# Patient Record
Sex: Female | Born: 1940 | Race: White | Hispanic: No | Smoking: Never smoker
Health system: Southern US, Community
[De-identification: ages and names within clinical notes are randomized; demographics above are authoritative.]

## PROBLEM LIST (undated history)

## (undated) DIAGNOSIS — R079 Chest pain, unspecified: Secondary | ICD-10-CM

## (undated) HISTORY — DX: Chest pain, unspecified: R07.9

## (undated) HISTORY — PX: CHOLECYSTECTOMY: SHX55

## (undated) HISTORY — PX: APPENDECTOMY: SHX54

---

## 2001-04-15 ENCOUNTER — Inpatient Hospital Stay (HOSPITAL_COMMUNITY): Admission: AD | Admit: 2001-04-15 | Discharge: 2001-04-19 | Payer: Self-pay | Admitting: Cardiovascular Disease

## 2014-01-16 DIAGNOSIS — E039 Hypothyroidism, unspecified: Secondary | ICD-10-CM | POA: Insufficient documentation

## 2014-01-16 DIAGNOSIS — I482 Chronic atrial fibrillation, unspecified: Secondary | ICD-10-CM

## 2014-01-16 HISTORY — DX: Chronic atrial fibrillation, unspecified: I48.20

## 2014-01-16 HISTORY — DX: Hypothyroidism, unspecified: E03.9

## 2014-01-23 DIAGNOSIS — Z9889 Other specified postprocedural states: Secondary | ICD-10-CM

## 2014-01-23 DIAGNOSIS — Z9049 Acquired absence of other specified parts of digestive tract: Secondary | ICD-10-CM | POA: Insufficient documentation

## 2014-01-23 HISTORY — DX: Acquired absence of other specified parts of digestive tract: Z98.890

## 2014-01-23 HISTORY — DX: Acquired absence of other specified parts of digestive tract: Z90.49

## 2016-11-17 DIAGNOSIS — R1319 Other dysphagia: Secondary | ICD-10-CM

## 2016-11-17 HISTORY — DX: Other dysphagia: R13.19

## 2018-11-25 DIAGNOSIS — G8929 Other chronic pain: Secondary | ICD-10-CM

## 2018-11-25 HISTORY — DX: Other chronic pain: G89.29

## 2018-12-01 HISTORY — DX: Hypomagnesemia: E83.42

## 2018-12-03 DIAGNOSIS — K5900 Constipation, unspecified: Secondary | ICD-10-CM

## 2018-12-03 HISTORY — DX: Constipation, unspecified: K59.00

## 2021-04-16 DIAGNOSIS — G4733 Obstructive sleep apnea (adult) (pediatric): Secondary | ICD-10-CM | POA: Insufficient documentation

## 2021-04-16 DIAGNOSIS — F32A Depression, unspecified: Secondary | ICD-10-CM

## 2021-04-16 DIAGNOSIS — K219 Gastro-esophageal reflux disease without esophagitis: Secondary | ICD-10-CM | POA: Insufficient documentation

## 2021-04-16 DIAGNOSIS — I10 Essential (primary) hypertension: Secondary | ICD-10-CM

## 2021-04-16 HISTORY — DX: Gastro-esophageal reflux disease without esophagitis: K21.9

## 2021-04-16 HISTORY — DX: Essential (primary) hypertension: I10

## 2021-04-16 HISTORY — DX: Obstructive sleep apnea (adult) (pediatric): G47.33

## 2021-04-16 HISTORY — DX: Depression, unspecified: F32.A

## 2021-07-03 ENCOUNTER — Ambulatory Visit: Payer: Medicare HMO | Admitting: Podiatry

## 2021-07-10 ENCOUNTER — Other Ambulatory Visit: Payer: Self-pay

## 2021-07-10 ENCOUNTER — Ambulatory Visit: Payer: Medicare HMO | Admitting: Podiatry

## 2021-07-10 DIAGNOSIS — B351 Tinea unguium: Secondary | ICD-10-CM

## 2021-07-10 DIAGNOSIS — M79609 Pain in unspecified limb: Secondary | ICD-10-CM

## 2021-07-17 NOTE — Progress Notes (Signed)
  Subjective:  Patient ID: Kristy Archer, female    DOB: 1941/09/03,  MRN: [id number]  No chief complaint on file.  80 y.o. female presents with the above complaint. History confirmed with patient. Staes the nails are thick and cause soreness with occasional pain especially wearing shoes.  Objective:  Physical Exam: warm, good capillary refill, nail exam onychomycosis of the toenails, no trophic changes or ulcerative lesions. DP pulses palpable, PT pulses palpable, and protective sensation intact.   No images are attached to the encounter.  Assessment:   1. Pain due to onychomycosis of nail    Plan:  Patient was evaluated and treated and all questions answered.  Onychomycosis -Nails palliatively debrided secondary to pain  Procedure: Nail Debridement Type of Debridement: manual, sharp debridement. Instrumentation: Nail nipper, rotary burr. Number of Nails: 10  No follow-ups on file.

## 2021-10-16 ENCOUNTER — Ambulatory Visit (INDEPENDENT_AMBULATORY_CARE_PROVIDER_SITE_OTHER): Payer: Medicare HMO | Admitting: Podiatry

## 2021-10-16 DIAGNOSIS — Z91199 Patient's noncompliance with other medical treatment and regimen due to unspecified reason: Secondary | ICD-10-CM

## 2021-10-16 NOTE — Progress Notes (Signed)
   Complete physical exam  Patient: Kristy Archer   DOB: 07/11/1998   81 y.o. Female  MRN: [id number]  Subjective:    No chief complaint on file.   Kristy Archer is a 81 y.o. female who presents today for a complete physical exam. She reports consuming a {diet types:17450} diet. {types:19826} She generally feels {DESC; WELL/FAIRLY WELL/POORLY:18703}. She reports sleeping {DESC; WELL/FAIRLY WELL/POORLY:18703}. She {does/does not:200015} have additional problems to discuss today.    Most recent fall risk assessment:    03/18/2022   10:42 AM  Fall Risk   Falls in the past year? 0  Number falls in past yr: 0  Injury with Fall? 0  Risk for fall due to : No Fall Risks  Follow up Falls evaluation completed     Most recent depression screenings:    03/18/2022   10:42 AM 02/06/2021   10:46 AM  PHQ 2/9 Scores  PHQ - 2 Score 0 0  PHQ- 9 Score 5     {VISON DENTAL STD PSA (Optional):27386}  {History (Optional):23778}  Patient Care Team: Jessup, Joy, NP as PCP - General (Nurse Practitioner)   Outpatient Medications Prior to Visit  Medication Sig   fluticasone (FLONASE) 50 MCG/ACT nasal spray Place 2 sprays into both nostrils in the morning and at bedtime. After 7 days, reduce to once daily.   norgestimate-ethinyl estradiol (SPRINTEC 28) 0.25-35 MG-MCG tablet Take 1 tablet by mouth daily.   Nystatin POWD Apply liberally to affected area 2 times per day   spironolactone (ALDACTONE) 100 MG tablet Take 1 tablet (100 mg total) by mouth daily.   No facility-administered medications prior to visit.    ROS        Objective:     There were no vitals taken for this visit. {Vitals History (Optional):23777}  Physical Exam   No results found for any visits on 04/23/22. {Show previous labs (optional):23779}    Assessment & Plan:    Routine Health Maintenance and Physical Exam  Immunization History  Administered Date(s) Administered   DTaP 09/24/1999, 11/20/1999,  01/29/2000, 10/14/2000, 04/29/2004   Hepatitis A 02/24/2008, 03/01/2009   Hepatitis B 07/12/1999, 08/19/1999, 01/29/2000   HiB (PRP-OMP) 09/24/1999, 11/20/1999, 01/29/2000, 10/14/2000   IPV 09/24/1999, 11/20/1999, 07/19/2000, 04/29/2004   Influenza,inj,Quad PF,6+ Mos 06/01/2014   Influenza-Unspecified 08/31/2012   MMR 07/19/2001, 04/29/2004   Meningococcal Polysaccharide 02/29/2012   Pneumococcal Conjugate-13 10/14/2000   Pneumococcal-Unspecified 01/29/2000, 04/13/2000   Tdap 02/29/2012   Varicella 07/19/2000, 02/24/2008    Health Maintenance  Topic Date Due   HIV Screening  Never done   Hepatitis C Screening  Never done   INFLUENZA VACCINE  04/21/2022   PAP-Cervical Cytology Screening  04/23/2022 (Originally 07/10/2020)   PAP SMEAR-Modifier  04/23/2022 (Originally 07/10/2020)   TETANUS/TDAP  04/23/2022 (Originally 02/28/2022)   HPV VACCINES  Discontinued   COVID-19 Vaccine  Discontinued    Discussed health benefits of physical activity, and encouraged her to engage in regular exercise appropriate for her age and condition.  Problem List Items Addressed This Visit   None Visit Diagnoses     Annual physical exam    -  Primary   Cervical cancer screening       Need for Tdap vaccination          No follow-ups on file.     Joy Jessup, NP   

## 2021-10-28 ENCOUNTER — Other Ambulatory Visit: Payer: Self-pay | Admitting: Family Medicine

## 2021-10-30 LAB — URINE CULTURE

## 2021-11-24 ENCOUNTER — Emergency Department (HOSPITAL_COMMUNITY)
Admission: EM | Admit: 2021-11-24 | Discharge: 2021-11-24 | Disposition: A | Discharge status: Home / Self Care | Payer: Medicare HMO | Attending: Emergency Medicine | Admitting: Emergency Medicine

## 2021-11-24 ENCOUNTER — Emergency Department (HOSPITAL_COMMUNITY): Payer: Medicare HMO

## 2021-11-24 DIAGNOSIS — R0602 Shortness of breath: Secondary | ICD-10-CM | POA: Diagnosis not present

## 2021-11-24 DIAGNOSIS — E039 Hypothyroidism, unspecified: Secondary | ICD-10-CM | POA: Insufficient documentation

## 2021-11-24 DIAGNOSIS — R0600 Dyspnea, unspecified: Secondary | ICD-10-CM

## 2021-11-24 DIAGNOSIS — Z20822 Contact with and (suspected) exposure to covid-19: Secondary | ICD-10-CM | POA: Diagnosis not present

## 2021-11-24 DIAGNOSIS — E876 Hypokalemia: Secondary | ICD-10-CM | POA: Insufficient documentation

## 2021-11-24 DIAGNOSIS — I10 Essential (primary) hypertension: Secondary | ICD-10-CM | POA: Insufficient documentation

## 2021-11-24 LAB — CBC
HCT: 42.3 % (ref 36.0–46.0)
Hemoglobin: 13.7 g/dL (ref 12.0–15.0)
MCH: 29.8 pg (ref 26.0–34.0)
MCHC: 32.4 g/dL (ref 30.0–36.0)
MCV: 92.2 fL (ref 80.0–100.0)
Platelets: 234 10*3/uL (ref 150–400)
RBC: 4.59 MIL/uL (ref 3.87–5.11)
RDW: 14.8 % (ref 11.5–15.5)
WBC: 8.5 10*3/uL (ref 4.0–10.5)
nRBC: 0 % (ref 0.0–0.2)

## 2021-11-24 LAB — COMPREHENSIVE METABOLIC PANEL
ALT: 13 U/L (ref 0–44)
AST: 20 U/L (ref 15–41)
Albumin: 3 g/dL — ABNORMAL LOW (ref 3.5–5.0)
Alkaline Phosphatase: 100 U/L (ref 38–126)
Anion gap: 8 (ref 5–15)
BUN: 10 mg/dL (ref 8–23)
CO2: 31 mmol/L (ref 22–32)
Calcium: 8.5 mg/dL — ABNORMAL LOW (ref 8.9–10.3)
Chloride: 105 mmol/L (ref 98–111)
Creatinine, Ser: 0.77 mg/dL (ref 0.44–1.00)
GFR, Estimated: >60 mL/min (ref >60)
Glucose, Bld: 92 mg/dL (ref 70–99)
Potassium: 3.3 mmol/L — ABNORMAL LOW (ref 3.5–5.1)
Sodium: 144 mmol/L (ref 135–145)
Total Bilirubin: 0.2 mg/dL — ABNORMAL LOW (ref 0.3–1.2)
Total Protein: 6.1 g/dL — ABNORMAL LOW (ref 6.5–8.1)

## 2021-11-24 LAB — D-DIMER, QUANTITATIVE: D-Dimer, Quant: 0.62 ug/mL-FEU — ABNORMAL HIGH (ref 0.00–0.50)

## 2021-11-24 LAB — RESP PANEL BY RT-PCR (FLU A&B, COVID) ARPGX2
Influenza A by PCR: NEGATIVE
Influenza B by PCR: NEGATIVE
SARS Coronavirus 2 by RT PCR: NEGATIVE

## 2021-11-24 LAB — BRAIN NATRIURETIC PEPTIDE: B Natriuretic Peptide: 225.4 pg/mL — ABNORMAL HIGH (ref 0.0–100.0)

## 2021-11-24 LAB — TROPONIN I (HIGH SENSITIVITY): Troponin I (High Sensitivity): 14 ng/L (ref <18)

## 2021-11-24 NOTE — ED Provider Notes (Signed)
?[facility] EMERGENCY DEPARTMENT ?Provider Note ? ? ?CSN: [id number] ?Arrival date & time: 11/24/21  0737 ? ?  ? ?History ? ?Chief complaint: Shortness of breath ? ?Kristy Archer is a 81 y.o. female. ? ?HPI ?Patient has a history of anemia, atrial fibrillation, empyema, reflux, hypertension, hypothyroidism, sleep apnea, GERD. ?Patient presents to the ED for evaluation of shortness of breath.  Patient states she has had the symptoms for at least a week or so.  Records reviewed and it appears patient was actually seen on January 28 of this year at [facility].  She was seen at that time for complaints of shortness of breath.  Patient's ED work-up was reassuring and she was discharged.  Patient states that she has been having shortness of breath primarily when she exerts herself.  When she is at rest she generally feels fine.  She had some fleeting chest pain last week but nothing recently.  She has not had any fevers chills or cough.  No leg swelling. ? ?Home Medications ?Prior to Admission medications   ?Not on File  ?   ? ?Allergies    ?Patient has no allergy information on record.   ? ?Review of Systems   ?Review of Systems  ?Constitutional:  Negative for fever.  ?Respiratory:  Negative for shortness of breath.   ?Cardiovascular:  Negative for leg swelling.  ? ?Physical Exam ?Updated Vital Signs ?BP (!) 182/72   Pulse 63   Temp 98.4 ?F (36.9 ?C) (Oral)   Resp 18   SpO2 100%  ?Physical Exam ?Vitals and nursing note reviewed.  ?Constitutional:   ?   General: She is not in acute distress. ?   Appearance: She is well-developed. She is not diaphoretic.  ?HENT:  ?   Head: Normocephalic and atraumatic.  ?   Right Ear: External ear normal.  ?   Left Ear: External ear normal.  ?Eyes:  ?   General: No scleral icterus.    ?   Right eye: No discharge.     ?   Left eye: No discharge.  ?   Conjunctiva/sclera: Conjunctivae normal.  ?Neck:  ?   Trachea: No tracheal deviation.   ?Cardiovascular:  ?   Rate and Rhythm: Normal rate and regular rhythm.  ?Pulmonary:  ?   Effort: Pulmonary effort is normal. No respiratory distress.  ?   Breath sounds: Normal breath sounds. No stridor. No wheezing or rales.  ?Abdominal:  ?   General: Bowel sounds are normal. There is no distension.  ?   Palpations: Abdomen is soft.  ?   Tenderness: There is no abdominal tenderness. There is no guarding or rebound.  ?Musculoskeletal:     ?   General: No tenderness or deformity.  ?   Cervical back: Neck supple.  ?Skin: ?   General: Skin is warm and dry.  ?   Findings: No rash.  ?Neurological:  ?   General: No focal deficit present.  ?   Mental Status: She is alert.  ?   Cranial Nerves: No cranial nerve deficit (no facial droop, extraocular movements intact, no slurred speech).  ?   Sensory: No sensory deficit.  ?   Motor: No abnormal muscle tone or seizure activity.  ?   Coordination: Coordination normal.  ?Psychiatric:     ?   Mood and Affect: Mood normal.  ? ? ?ED Results / Procedures / Treatments   ?Labs ?(all labs ordered are listed,  but only abnormal results are displayed) ?Labs Reviewed  ?COMPREHENSIVE METABOLIC PANEL - Abnormal; Notable for the following components:  ?    Result Value  ? Potassium 3.3 (*)   ? Calcium 8.5 (*)   ? Total Protein 6.1 (*)   ? Albumin 3.0 (*)   ? Total Bilirubin 0.2 (*)   ? All other components within normal limits  ?BRAIN NATRIURETIC PEPTIDE - Abnormal; Notable for the following components:  ? B Natriuretic Peptide 225.4 (*)   ? All other components within normal limits  ?D-DIMER, QUANTITATIVE - Abnormal; Notable for the following components:  ? D-Dimer, Quant 0.62 (*)   ? All other components within normal limits  ?RESP PANEL BY RT-PCR (FLU A&B, COVID) ARPGX2  ?CBC  ?TROPONIN I (HIGH SENSITIVITY)  ? ? ?EKG ?EKG Interpretation ? ?Date/Time:  Monday November 24 2021 07:54:09 EST ?Ventricular Rate:  77 ?PR Interval:  140 ?QRS Duration: 87 ?QT Interval:  445 ?QTC Calculation: 504 ?R  Axis:   -35 ?Text Interpretation: Sinus rhythm Atrial premature complex Left axis deviation Prolonged QT interval inferior st elevation decreased since last tracing Confirmed by Linwood Dibbles [phone]) on 11/24/2021 7:57:26 AM ? ?Radiology ?DG Chest Port 1 View ? ?Result Date: 11/24/2021 ?CLINICAL DATA:  Shortness of breath and weakness. EXAM: PORTABLE CHEST 1 VIEW COMPARISON:  Chest x-ray dated October 18, 2021. FINDINGS: The heart size and mediastinal contours are within normal limits. Normal pulmonary vascularity. No focal consolidation, pleural effusion, or pneumothorax. Unchanged elevation of the right hemidiaphragm. No acute osseous abnormality. IMPRESSION: 1. No active disease. Electronically Signed   By: Obie Dredge M.D.   On: 11/24/2021 08:45   ? ?Procedures ?Procedures  ? ? ?Medications Ordered in ED ?Medications - No data to display ? ?ED Course/ Medical Decision Making/ A&P ?Clinical Course as of 11/24/21 1036  ?Mon Nov 24, 2021  ?2637 Brain natriuretic peptide(!) ?BNP slightly increased at 225 [JK]  ?8588 Troponin I (High Sensitivity) ?Initial troponin normal [JK]  ?5027 Comprehensive metabolic panel(!) ?Potassium slightly decreased [JK]  ?7412 D-dimer, quantitative(!) ?D-dimer slightly increased but within age-adjusted normal range [JK]  ?0940 CBC ?Normal [JK]  ?0940 DG Chest Port 1 View ?Chest x-ray images and radiology report reviewed.  No acute abnormalities [JK]  ?1012 Resp Panel by RT-PCR (Flu A&B, Covid) Nasopharyngeal Swab [JK]  ?1012 Resp Panel by RT-PCR (Flu A&B, Covid) Nasopharyngeal Swab ?COVID and flu negative [JK]  ?  ?Clinical Course User Index ?[JK] Linwood Dibbles, MD  ? ?                        ?Medical Decision Making ?Amount and/or Complexity of Data Reviewed ?Labs: ordered. Decision-making details documented in ED Course. ?Radiology: ordered. Decision-making details documented in ED Course. ? ?Shortness of breath. ?Patient presented with complaints of shortness of breath primarily with  activity.  Her ED work-up is reassuring.  Patient does not have evidence of pulmonary edema on chest x-ray.  BNP is only slightly elevated.  Doubt this is consistent with CHF exacerbation. ? ?No evidence of pneumonia or pneumothorax on x-ray.  COVID and flu are negative. ? ?D-dimer is slightly elevated but is within the age-adjusted normal range.  Not consistent with pulmonary embolism. ? ?Patient has normal oxygen saturation.  Vital signs normal other than hypertension which I do not feel is causing her symptoms.  Patient would benefit from further evaluation such as possible echocardiogram, stress test or pulmonary evaluation.  Discussed this with  both the patient and the daughter but I do feel she does not require hospitalization at this time and is appropriate for outpatient evaluation. ? ?History of UTI ?Patient states she has had issues with UTIs for several months now.  She is worried about her kidney funciton. She did see a urologist this past week.  No fevers or chills.  No acute changes. Renal function normal. Repeat urinary testing deferred at this time appears stable to continue with her outpatient management ? ? ? ? ? ? ? ?Final Clinical Impression(s) / ED Diagnoses ?Final diagnoses:  ?Dyspnea, unspecified type  ? ? ?Rx / DC Orders ?ED Discharge Orders   ? ? None  ? ?  ? ? ?  ?Linwood Dibbles, MD ?11/24/21 1036 ? ?

## 2021-11-24 NOTE — Discharge Instructions (Signed)
The test today in the ED were reassuring however as we discussed follow up evaluation is indicated. Contact your primary care doctor to discuss further evaluation of your shortness of breath with possibly a cardiologist or pulmonary doctor. ?

## 2021-11-24 NOTE — ED Triage Notes (Signed)
Pt here from home with c/o weakness times 3 days , c/o sob that started this morning , family members were sick over the last wek ?

## 2021-12-03 ENCOUNTER — Other Ambulatory Visit: Payer: Self-pay

## 2021-12-03 DIAGNOSIS — H814 Vertigo of central origin: Secondary | ICD-10-CM | POA: Insufficient documentation

## 2021-12-03 DIAGNOSIS — R1310 Dysphagia, unspecified: Secondary | ICD-10-CM

## 2021-12-03 DIAGNOSIS — G894 Chronic pain syndrome: Secondary | ICD-10-CM

## 2021-12-03 DIAGNOSIS — N390 Urinary tract infection, site not specified: Secondary | ICD-10-CM | POA: Insufficient documentation

## 2021-12-03 DIAGNOSIS — F330 Major depressive disorder, recurrent, mild: Secondary | ICD-10-CM

## 2021-12-03 DIAGNOSIS — M79671 Pain in right foot: Secondary | ICD-10-CM

## 2021-12-03 DIAGNOSIS — M150 Primary generalized (osteo)arthritis: Secondary | ICD-10-CM

## 2021-12-03 DIAGNOSIS — B351 Tinea unguium: Secondary | ICD-10-CM | POA: Insufficient documentation

## 2021-12-03 DIAGNOSIS — F3342 Major depressive disorder, recurrent, in full remission: Secondary | ICD-10-CM

## 2021-12-03 DIAGNOSIS — D518 Other vitamin B12 deficiency anemias: Secondary | ICD-10-CM

## 2021-12-03 DIAGNOSIS — E559 Vitamin D deficiency, unspecified: Secondary | ICD-10-CM

## 2021-12-03 DIAGNOSIS — E119 Type 2 diabetes mellitus without complications: Secondary | ICD-10-CM | POA: Insufficient documentation

## 2021-12-03 DIAGNOSIS — Z79899 Other long term (current) drug therapy: Secondary | ICD-10-CM

## 2021-12-03 DIAGNOSIS — M19071 Primary osteoarthritis, right ankle and foot: Secondary | ICD-10-CM

## 2021-12-03 DIAGNOSIS — E43 Unspecified severe protein-calorie malnutrition: Secondary | ICD-10-CM

## 2021-12-03 DIAGNOSIS — Z5181 Encounter for therapeutic drug level monitoring: Secondary | ICD-10-CM

## 2021-12-03 DIAGNOSIS — K589 Irritable bowel syndrome without diarrhea: Secondary | ICD-10-CM

## 2021-12-03 DIAGNOSIS — E782 Mixed hyperlipidemia: Secondary | ICD-10-CM | POA: Insufficient documentation

## 2021-12-03 DIAGNOSIS — M810 Age-related osteoporosis without current pathological fracture: Secondary | ICD-10-CM

## 2021-12-03 DIAGNOSIS — Q845 Enlarged and hypertrophic nails: Secondary | ICD-10-CM

## 2021-12-03 DIAGNOSIS — M25561 Pain in right knee: Secondary | ICD-10-CM

## 2021-12-03 DIAGNOSIS — M25552 Pain in left hip: Secondary | ICD-10-CM

## 2021-12-03 DIAGNOSIS — W19XXXA Unspecified fall, initial encounter: Secondary | ICD-10-CM

## 2021-12-03 DIAGNOSIS — R079 Chest pain, unspecified: Secondary | ICD-10-CM | POA: Insufficient documentation

## 2021-12-03 HISTORY — DX: Unspecified fall, initial encounter: W19.XXXA

## 2021-12-03 HISTORY — DX: Irritable bowel syndrome, unspecified: K58.9

## 2021-12-03 HISTORY — DX: Primary osteoarthritis, right ankle and foot: M19.071

## 2021-12-03 HISTORY — DX: Major depressive disorder, recurrent, mild: F33.0

## 2021-12-03 HISTORY — DX: Mixed hyperlipidemia: E78.2

## 2021-12-03 HISTORY — DX: Major depressive disorder, recurrent, in full remission: F33.42

## 2021-12-03 HISTORY — DX: Age-related osteoporosis without current pathological fracture: M81.0

## 2021-12-03 HISTORY — DX: Chronic pain syndrome: G89.4

## 2021-12-03 HISTORY — DX: Enlarged and hypertrophic nails: Q84.5

## 2021-12-03 HISTORY — DX: Primary generalized (osteo)arthritis: M15.0

## 2021-12-03 HISTORY — DX: Urinary tract infection, site not specified: N39.0

## 2021-12-03 HISTORY — DX: Encounter for therapeutic drug level monitoring: Z51.81

## 2021-12-03 HISTORY — DX: Pain in right foot: M79.671

## 2021-12-03 HISTORY — DX: Dysphagia, unspecified: R13.10

## 2021-12-03 HISTORY — DX: Type 2 diabetes mellitus without complications: E11.9

## 2021-12-03 HISTORY — DX: Other long term (current) drug therapy: Z79.899

## 2021-12-03 HISTORY — DX: Pain in right knee: M25.561

## 2021-12-03 HISTORY — DX: Vitamin D deficiency, unspecified: E55.9

## 2021-12-03 HISTORY — DX: Tinea unguium: B35.1

## 2021-12-03 HISTORY — DX: Unspecified severe protein-calorie malnutrition: E43

## 2021-12-03 HISTORY — DX: Pain in left hip: M25.552

## 2021-12-03 HISTORY — DX: Vertigo of central origin: H81.4

## 2021-12-03 HISTORY — DX: Other vitamin B12 deficiency anemias: D51.8

## 2021-12-05 ENCOUNTER — Telehealth: Payer: Self-pay | Admitting: Cardiology

## 2021-12-05 ENCOUNTER — Other Ambulatory Visit: Payer: Self-pay

## 2021-12-05 ENCOUNTER — Ambulatory Visit (HOSPITAL_BASED_OUTPATIENT_CLINIC_OR_DEPARTMENT_OTHER)
Admission: RE | Admit: 2021-12-05 | Discharge: 2021-12-05 | Disposition: A | Discharge status: Home / Self Care | Payer: Medicare HMO | Source: Ambulatory Visit | Attending: Cardiology | Admitting: Cardiology

## 2021-12-05 ENCOUNTER — Ambulatory Visit: Payer: Medicare HMO | Admitting: Cardiology

## 2021-12-05 ENCOUNTER — Encounter: Payer: Self-pay | Admitting: Cardiology

## 2021-12-05 VITALS — BP 146/60 | HR 51 | Ht 62.0 in | Wt 117.6 lb

## 2021-12-05 DIAGNOSIS — R079 Chest pain, unspecified: Secondary | ICD-10-CM

## 2021-12-05 DIAGNOSIS — R0602 Shortness of breath: Secondary | ICD-10-CM | POA: Diagnosis present

## 2021-12-05 DIAGNOSIS — R0609 Other forms of dyspnea: Secondary | ICD-10-CM

## 2021-12-05 DIAGNOSIS — R7989 Other specified abnormal findings of blood chemistry: Secondary | ICD-10-CM

## 2021-12-05 DIAGNOSIS — E782 Mixed hyperlipidemia: Secondary | ICD-10-CM

## 2021-12-05 DIAGNOSIS — I10 Essential (primary) hypertension: Secondary | ICD-10-CM | POA: Insufficient documentation

## 2021-12-05 DIAGNOSIS — E119 Type 2 diabetes mellitus without complications: Secondary | ICD-10-CM

## 2021-12-05 DIAGNOSIS — I482 Chronic atrial fibrillation, unspecified: Secondary | ICD-10-CM

## 2021-12-05 HISTORY — DX: Other specified abnormal findings of blood chemistry: R79.89

## 2021-12-05 HISTORY — DX: Chest pain, unspecified: R07.9

## 2021-12-05 HISTORY — DX: Other forms of dyspnea: R06.09

## 2021-12-05 MED ORDER — IOHEXOL 350 MG/ML SOLN
100.0000 mL | Freq: Once | INTRAVENOUS | Status: AC | PRN
  Administered 2021-12-05: 60 mL via INTRAVENOUS

## 2021-12-05 NOTE — Telephone Encounter (Signed)
Called patient's daughter and reviewed the results of CT with her. Patient's daughter had no questions at this time. ?

## 2021-12-05 NOTE — Telephone Encounter (Signed)
Patient's daughter calling for CT results. She states the patient was sent in for a CT today to check for a blood clot. ?

## 2021-12-05 NOTE — Patient Instructions (Signed)
Medication Instructions:  ?Your physician recommends that you continue on your current medications as directed. Please refer to the Current Medication list given to you today. ? ?*If you need a refill on your cardiac medications before your next appointment, please call your pharmacy* ? ? ?Lab Work: ?Your physician recommends that you have a BMET and BNP done today in the office.  ? ?If you have labs (blood work) drawn today and your tests are completely normal, you will receive your results only by: ?MyChart Message (if you have MyChart) OR ?A paper copy in the mail ?If you have any lab test that is abnormal or we need to change your treatment, we will call you to review the results. ? ? ?Testing/Procedures: ?Your physician has requested that you have a lexiscan myoview. For further information please visit [email or web address]. Please follow instruction sheet, as given. ? ?The test will take approximately 3 to 4 hours to complete; you may bring reading material.  If someone comes with you to your appointment, they will need to remain in the main lobby due to limited space in the testing area.  ? ?How to prepare for your Myocardial Perfusion Test: ?Do not eat or drink 3 hours prior to your test, except you may have water. ?Do not consume products containing caffeine (regular or decaffeinated) 12 hours prior to your test. (ex: coffee, chocolate, sodas, tea). ?Do bring a list of your current medications with you.  If not listed below, you may take your medications as normal. ?Do wear comfortable clothes (no dresses or overalls) and walking shoes, tennis shoes preferred (No heels or open toe shoes are allowed). ?Do NOT wear cologne, perfume, aftershave, or lotions (deodorant is allowed). ?If these instructions are not followed, your test will have to be rescheduled. ? ?Your physician has requested that you have an echocardiogram. Echocardiography is a painless test that uses sound waves to create images of your heart.  It provides your doctor with information about the size and shape of your heart and how well your heart?s chambers and valves are working. This procedure takes approximately one hour. There are no restrictions for this procedure. ? ?Non-Cardiac CT Angiography (CTA), is a special type of CT scan that uses a computer to produce multi-dimensional views of major blood vessels throughout the body. In CT angiography, a contrast material is injected through an IV to help visualize the blood vessels  ? ?Follow-Up: ?At [facility], you and your health needs are our priority.  As part of our continuing mission to provide you with exceptional heart care, we have created designated Provider Care Teams.  These Care Teams include your primary Cardiologist (physician) and Advanced Practice Providers (APPs -  Physician Assistants and Nurse Practitioners) who all work together to provide you with the care you need, when you need it. ? ?We recommend signing up for the patient portal called "MyChart".  Sign up information is provided on this After Visit Summary.  MyChart is used to connect with patients for Virtual Visits (Telemedicine).  Patients are able to view lab/test results, encounter notes, upcoming appointments, etc.  Non-urgent messages can be sent to your provider as well.   ?To learn more about what you can do with MyChart, go to [email or web address].   ? ?Your next appointment:   ?2 month(s) ? ?The format for your next appointment:   ?In Person ? ?Provider:   ?Belva Cromeajan Revankar, MD ? ? ?Other Instructions ?Cardiac Nuclear Scan ?A cardiac nuclear scan is a test that  is done to check the flow of blood to your heart. It is done when you are resting and when you are exercising. The test looks for problems such as: ?Not enough blood reaching a portion of the heart. ?The heart muscle not working as it should. ?You may need this test if: ?You have heart disease. ?You have had lab results that are not normal. ?You have had  heart surgery or a balloon procedure to open up blocked arteries (angioplasty). ?You have chest pain. ?You have shortness of breath. ?In this test, a special dye (tracer) is put into your bloodstream. The tracer will travel to your heart. A camera will then take pictures of your heart to see how the tracer moves through your heart. This test is usually done at a hospital and takes 2-4 hours. ?Tell a doctor about: ?Any allergies you have. ?All medicines you are taking, including vitamins, herbs, eye drops, creams, and over-the-counter medicines. ?Any problems you or family members have had with anesthetic medicines. ?Any blood disorders you have. ?Any surgeries you have had. ?Any medical conditions you have. ?Whether you are pregnant or may be pregnant. ?What are the risks? ?Generally, this is a safe test. However, problems may occur, such as: ?Serious chest pain and heart attack. This is only a risk if the stress portion of the test is done. ?Rapid heartbeat. ?A feeling of warmth in your chest. This feeling usually does not last long. ?Allergic reaction to the tracer. ?What happens before the test? ?Ask your doctor about changing or stopping your normal medicines. This is important. ?Follow instructions from your doctor about what you cannot eat or drink. ?Remove your jewelry on the day of the test. ?What happens during the test? ?An IV tube will be inserted into one of your veins. ?Your doctor will give you a small amount of tracer through the IV tube. ?You will wait for 20-40 minutes while the tracer moves through your bloodstream. ?Your heart will be monitored with an electrocardiogram (ECG). ?You will lie down on an exam table. ?Pictures of your heart will be taken for about 15-20 minutes. ?You may also have a stress test. For this test, one of these things may be done: ?You will be asked to exercise on a treadmill or a stationary bike. ?You will be given medicines that will make your heart work harder. This is  done if you are unable to exercise. ?When blood flow to your heart has peaked, a tracer will again be given through the IV tube. ?After 20-40 minutes, you will get back on the exam table. More pictures will be taken of your heart. ?Depending on the tracer that is used, more pictures may need to be taken 3-4 hours later. ?Your IV tube will be removed when the test is over. ?The test may vary among doctors and hospitals. ?What happens after the test? ?Ask your doctor: ?Whether you can return to your normal schedule, including diet, activities, and medicines. ?Whether you should drink more fluids. This will help to remove the tracer from your body. Drink enough fluid to keep your pee (urine) pale yellow. ?Ask your doctor, or the department that is doing the test: ?When will my results be ready? ?How will I get my results? ?Summary ?A cardiac nuclear scan is a test that is done to check the flow of blood to your heart. ?Tell your doctor whether you are pregnant or may be pregnant. ?Before the test, ask your doctor about changing or stopping your  normal medicines. This is important. ?Ask your doctor whether you can return to your normal activities. You may be asked to drink more fluids. ?This information is not intended to replace advice given to you by your health care provider. Make sure you discuss any questions you have with your health care provider. ?Document Revised: 12/28/2018 Document Reviewed: 02/21/2018 ?Elsevier Patient Education ? 2021 [employer] ? ? ? ?Echocardiogram ?An echocardiogram is a test that uses sound waves (ultrasound) to produce images of the heart. ?Images from an echocardiogram can provide important information about: ?Heart size and shape. ?The size and thickness and movement of your heart's walls. ?Heart muscle function and strength. ?Heart valve function or if you have stenosis. Stenosis is when the heart valves are too narrow. ?If blood is flowing backward through the heart valves  (regurgitation). ?A tumor or infectious growth around the heart valves. ?Areas of heart muscle that are not working well because of poor blood flow or injury from a heart attack. ?Aneurysm detection. An aneur

## 2021-12-05 NOTE — Progress Notes (Signed)
?Cardiology Office Note:   ? ?Date:  12/05/2021  ? ?ID:  Kristy Archer, DOB 06/12/1941, MRN [id number] ? ?PCP:  Hortencia Conradi, NP  ?Cardiologist:  Garwin Brothers, MD  ? ?Referring MD: Hortencia Conradi, NP  ? ? ?ASSESSMENT:   ? ?1. Essential hypertension   ?2. Mixed hyperlipidemia   ?3. Type 2 diabetes mellitus without complication, without long-term current use of insulin (HCC)   ?4. Chest pain, unspecified type   ?5. Chronic atrial fibrillation, unspecified (HCC)   ?6. Shortness of breath   ?7. Dyspnea on exertion   ?8. Chest pain of uncertain etiology   ?9. Elevated d-dimer   ? ?PLAN:   ? ?In order of problems listed above: ? ?Primary prevention stressed with the patient.  Importance of compliance with diet medication stressed and she vocalized understanding. ?Paroxysmal atrial fibrillation:I discussed with the patient atrial fibrillation, disease process. Management and therapy including rate and rhythm control, anticoagulation benefits and potential risks were discussed extensively with the patient. Patient had multiple questions which were answered to patient's satisfaction.  Not on anticoagulation because benefit risk ratio does not favor her.  She has high fall risk. ?Dyspnea on exertion: Concerning to me.  I will definitely get a CT scan with contrast in this lady to rule out pulmonary embolism.  She has an elevated D-dimer in the past.  I do not see any further evaluation. ?Elevated BNP we will try to assess this with echocardiogram to assess left ventricular systolic function.  We will also check BNP today and Chem-7. ?If the aforementioned tests are negative we will do a Lexiscan sestamibi to assess for any objective evidence of coronary artery disease in view of these findings.  I discussed this with the patient and daughter at length.  He knows to go to the nearest emergency room for any concerning symptoms. ?Patient will be seen in follow-up appointment in 2 months or earlier if the patient has any  concerns ? ? ? ?Medication Adjustments/Labs and Tests Ordered: ?Current medicines are reviewed at length with the patient today.  Concerns regarding medicines are outlined above.  ?Orders Placed This Encounter  ?Procedures  ? CT Angio Chest Pulmonary Embolism (PE) W or WO Contrast  ? Basic metabolic panel  ? B Nat Peptide  ? MYOCARDIAL PERFUSION IMAGING  ? EKG 12-Lead  ? ECHOCARDIOGRAM COMPLETE  ? ?No orders of the defined types were placed in this encounter. ? ? ? ?History of Present Illness:   ? ?Kristy Archer is a 81 y.o. female who is being seen today for the evaluation of dyspnea on exertion at the request of Hortencia Conradi, NP.  Patient is a pleasant 81 year old female.  She is brought in by her daughter.  Apparently the patient ambulates with a walker and is a very fully lady.  She has history of paroxysmal atrial fibrillation, essential hypertension and dyslipidemia.  She is not on anticoagulation because of gait instability.  I reviewed her records.  She has had an elevated D-dimer in the past.  At the time of my evaluation, the patient is alert awake oriented and in no distress. ? ?Past Medical History:  ?Diagnosis Date  ? Age-related osteoporosis without current pathological fracture 12/03/2021  ? Chest pain   ? Chronic atrial fibrillation, unspecified (HCC) 01/16/2014  ? Last Assessment & Plan:  Formatting of this note might be different from the original. -Home meds: Sotalol 80 mg twice daily, Xarelto 20 mg daily -Sotalol dose  had to be temporarily reduced due to slight decrease in renal functions however it was returned to her home dose.  -Heart rate mostly in the 50s, QTC on EKG 11/30/18  478 msec -Xarelto had to be held on admission for insertion of chest tube   ? Chronic back pain 11/25/2018  ? Last Assessment & Plan:  Formatting of this note might be different from the original. - Continue home pain medication  - Added miralax daily prn  ? Chronic pain syndrome 12/03/2021  ? Constipation 12/03/2018  ?  Last Assessment & Plan:  Formatting of this note might be different from the original. History of intermittent constipation Patient on Colace and Senokot Added miralax prn  ? Depression 04/16/2021  ? Dysphagia 12/03/2021  ? Encounter for therapeutic drug level monitoring 12/03/2021  ? Enlarged and hypertrophic nails 12/03/2021  ? Esophageal dysphagia 11/17/2016  ? Essential hypertension 04/16/2021  ? Fall 12/03/2021  ? Gastro-esophageal reflux disease without esophagitis 04/16/2021  ? 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 Last Assessment & Plan:  Formatting of this note is different from the original. - History of hiatal hernia status post fundoplication in 2005.  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  History of multiple EGDs for dilatation. - S/p thoracotomy with partial gastrectomy/esophagectomy in 2008 with thoracic esophagogastric   ? Hypomagnesemia 12/01/2018  ? Last Assessment & Plan:  Formatting of this note might be different from the original. Replaced during hospitalization.  ? Hypothyroidism 01/16/2014  ? Last Assessment & Plan:  Formatting of this note might be different from the original. Continue levothyroxine 88 mcg  ? Irritable bowel syndrome 12/03/2021  ? Mild recurrent major depression (HCC) 12/03/2021  ? Mixed hyperlipidemia 12/03/2021  ? OSA (obstructive sleep apnea) 04/16/2021  ? Other long term (current) drug therapy 12/03/2021  ? Other vitamin B12 deficiency anemias 12/03/2021  ? Pain in left hip 12/03/2021  ? Pain in right foot 12/03/2021  ? Pain in right knee 12/03/2021  ? Primary generalized (osteo)arthritis 12/03/2021  ? Primary osteoarthritis, right ankle and foot 12/03/2021  ? Recurrent major depression in full remission (HCC) 12/03/2021  ? Tinea unguium 12/03/2021  ? Type 2 diabetes mellitus without complications (HCC) 12/03/2021  ? Unspecified severe protein-calorie malnutrition (HCC) 12/03/2021  ? Urinary tract infectious disease  12/03/2021  ? 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 Vitamin D deficiency 12/03/2021  ? ? ?Past Surgical History:  ?Procedure Laterality Date  ? APPENDECTOMY    ? CHOLECYSTECTOMY    ? ? ?Current Medications: ?Current Meds  ?Medication Sig  ? aspirin EC 81 MG tablet Take 81 mg by mouth daily.  ? cyanocobalamin 1000 MCG tablet Take 1,000 mcg by mouth daily.  ? famotidine (PEPCID) 40 MG tablet Take 40 mg by mouth at bedtime.  ? levothyroxine (SYNTHROID) 88 MCG tablet Take 88 mcg by mouth every morning.  ? omeprazole (PRILOSEC) 40 MG capsule Take 40 mg by mouth 2 (two) times daily.  ? ondansetron (ZOFRAN) 8 MG tablet Take 8 mg by mouth 2 (two) times daily as needed for nausea/vomiting.  ? oxyCODONE-acetaminophen (PERCOCET) 10-325 MG tablet Take 1 tablet by mouth 4 (four) times daily as needed for pain.  ? sertraline (ZOLOFT) 100 MG tablet Take 100 mg by mouth daily.  ? simvastatin (ZOCOR) 20 MG tablet Take 20 mg by mouth every evening.  ? sotalol (BETAPACE) 80 MG tablet Take 80 mg by mouth every 12 (twelve) hours.  ?  sucralfate (CARAFATE) 1 g tablet Take 1 g by mouth 4 (four) times daily.  ?  ? ?Allergies:   Codeine, Linezolid, and Sulfa antibiotics  ? ?Social History  ? ?Socioeconomic History  ? Marital status: Divorced  ?  Spouse name: Not on file  ? Number of children: Not on file  ? Years of education: Not on file  ? Highest education level: Not on file  ?Occupational History  ? Not on file  ?Tobacco Use  ? Smoking status: Never  ? Smokeless tobacco: Never  ?Substance and Sexual Activity  ? Alcohol use: Not on file  ? Drug use: Not on file  ? Sexual activity: Not on file  ?Other Topics Concern  ? Not on file  ?Social History Narrative  ? Not on file  ? ?Social Determinants of Health  ? ?Financial Resource Strain: Not on file  ?Food Insecurity: Not on file  ?Transportation Needs: Not on file  ?Physical Activity: Not on file  ?Stress: Not on file  ?Social Connections: Not on file  ?  ? ?Family History: ?The patient's  family history includes Cancer in her mother; Diabetes in her father and mother; Heart disease in her father; Hypertension in her father. ? ?ROS:   ?Please see the history of present illness.    ?All other sy

## 2021-12-06 LAB — BASIC METABOLIC PANEL
BUN/Creatinine Ratio: 20 (ref 12–28)
BUN: 13 mg/dL (ref 8–27)
CO2: 32 mmol/L — ABNORMAL HIGH (ref 20–29)
Calcium: 8.7 mg/dL (ref 8.7–10.3)
Chloride: 103 mmol/L (ref 96–106)
Creatinine, Ser: 0.66 mg/dL (ref 0.57–1.00)
Glucose: 81 mg/dL (ref 70–99)
Potassium: 4.1 mmol/L (ref 3.5–5.2)
Sodium: 143 mmol/L (ref 134–144)
eGFR: 89 mL/min/{1.73_m2} (ref >59)

## 2021-12-06 LAB — BRAIN NATRIURETIC PEPTIDE: BNP: 279 pg/mL — ABNORMAL HIGH (ref 0.0–100.0)

## 2021-12-09 ENCOUNTER — Telehealth: Payer: Self-pay | Admitting: *Deleted

## 2021-12-09 NOTE — Telephone Encounter (Signed)
Left message on voicemail per DPR in reference to upcoming appointment scheduled on 12/11/21 at 1100 with detailed instructions given per Myocardial Perfusion Study Information Sheet for the test. LM to arrive 15 minutes early, and that it is imperative to arrive on time for appointment to keep from having the test rescheduled. If you need to cancel or reschedule your appointment, please call the office within 24 hours of your appointment. Failure to do so may result in a cancellation of your appointment, and a $50 no show fee. Phone number given for call back for any questions.  ?No mychart available.Thelonious Kauffmann, Adelene Idler ? ?

## 2021-12-11 ENCOUNTER — Other Ambulatory Visit: Payer: Medicare HMO

## 2021-12-16 ENCOUNTER — Ambulatory Visit (INDEPENDENT_AMBULATORY_CARE_PROVIDER_SITE_OTHER): Payer: Medicare HMO

## 2021-12-16 ENCOUNTER — Other Ambulatory Visit: Payer: Self-pay

## 2021-12-16 ENCOUNTER — Telehealth: Payer: Self-pay

## 2021-12-16 DIAGNOSIS — I10 Essential (primary) hypertension: Secondary | ICD-10-CM

## 2021-12-16 DIAGNOSIS — I482 Chronic atrial fibrillation, unspecified: Secondary | ICD-10-CM

## 2021-12-16 DIAGNOSIS — R079 Chest pain, unspecified: Secondary | ICD-10-CM | POA: Diagnosis not present

## 2021-12-16 LAB — MYOCARDIAL PERFUSION IMAGING
LV dias vol: 58 mL (ref 46–106)
LV sys vol: 16 mL
Nuc Stress EF: 72 %
Peak HR: 90 {beats}/min
Rest HR: 54 {beats}/min
Rest Nuclear Isotope Dose: 10.6 mCi
SDS: 8
SRS: 10
SSS: 18
Stress Nuclear Isotope Dose: 29.7 mCi
TID: 0.93

## 2021-12-16 LAB — ECHOCARDIOGRAM COMPLETE
Area-P 1/2: 4.36 cm2
P 1/2 time: 655 msec
S' Lateral: 2.8 cm

## 2021-12-16 MED ORDER — REGADENOSON 0.4 MG/5ML IV SOLN
0.4000 mg | Freq: Once | INTRAVENOUS | Status: AC
Start: 2021-12-16 — End: 2021-12-16
  Administered 2021-12-16: 0.4 mg via INTRAVENOUS

## 2021-12-16 MED ORDER — TECHNETIUM TC 99M TETROFOSMIN IV KIT
29.7000 | PACK | Freq: Once | INTRAVENOUS | Status: AC | PRN
Start: 2021-12-16 — End: 2021-12-16
  Administered 2021-12-16: 29.7 via INTRAVENOUS

## 2021-12-16 MED ORDER — AMINOPHYLLINE 25 MG/ML IV SOLN
75.0000 mg | Freq: Once | INTRAVENOUS | Status: AC
  Administered 2021-12-16: 75 mg via INTRAVENOUS

## 2021-12-16 MED ORDER — TECHNETIUM TC 99M TETROFOSMIN IV KIT
10.6000 | PACK | Freq: Once | INTRAVENOUS | Status: AC | PRN
Start: 2021-12-16 — End: 2021-12-16
  Administered 2021-12-16: 10.6 via INTRAVENOUS

## 2021-12-16 NOTE — Telephone Encounter (Signed)
Patient notified of results, Results faxed to PCP.  ?

## 2021-12-16 NOTE — Telephone Encounter (Signed)
-----   Message from Jenean Lindau, MD sent at 12/16/2021  4:03 PM EDT ----- ?The results of the study is unremarkable. Please inform patient. I will discuss in detail at next appointment. Cc  primary care/referring physician ?Jenean Lindau, MD 12/16/2021 4:03 PM  ?

## 2022-02-11 ENCOUNTER — Ambulatory Visit: Payer: Medicare HMO | Admitting: Cardiology

## 2022-02-25 ENCOUNTER — Encounter: Payer: Self-pay | Admitting: Cardiology

## 2022-02-25 ENCOUNTER — Ambulatory Visit: Payer: Medicare HMO | Admitting: Cardiology

## 2022-02-25 VITALS — BP 138/66 | HR 62 | Ht 62.0 in | Wt 108.0 lb

## 2022-02-25 DIAGNOSIS — I482 Chronic atrial fibrillation, unspecified: Secondary | ICD-10-CM | POA: Diagnosis not present

## 2022-02-25 DIAGNOSIS — E782 Mixed hyperlipidemia: Secondary | ICD-10-CM | POA: Diagnosis not present

## 2022-02-25 DIAGNOSIS — I10 Essential (primary) hypertension: Secondary | ICD-10-CM

## 2022-02-25 NOTE — Patient Instructions (Signed)

## 2022-02-25 NOTE — Progress Notes (Signed)
Cardiology Office Note:    Date:  02/25/2022   ID:  Kristy Archer, DOB Jun 24, 1941, MRN [id number]  PCP:  Hortencia Conradi, NP  Cardiologist:  Garwin Brothers, MD   Referring MD: Hortencia Conradi, NP    ASSESSMENT:    1. Chronic atrial fibrillation, unspecified (HCC)   2. Essential hypertension   3. Mixed hyperlipidemia    PLAN:    In order of problems listed above:  Primary prevention stressed with the patient.  Importance of compliance with diet and medication stressed and she vocalized understanding. Atrial fibrillation: Well-controlled ventricular rate.  Not on anticoagulation because of hypertension.  Tolerating sotalol well.  Disease state discussed with her at length. Mixed dyslipidemia: Lipid lowering therapy managed by primary care.  Diet emphasized. Ascending aortic aneurysm: Followed by [facility] CT scan report discussed with the patient at length.  Extra cardiac issues will be followed by primary care. Patient will be seen in follow-up appointment in 12 months or earlier if the patient has any concerns    Medication Adjustments/Labs and Tests Ordered: Current medicines are reviewed at length with the patient today.  Concerns regarding medicines are outlined above.  No orders of the defined types were placed in this encounter.  No orders of the defined types were placed in this encounter.    No chief complaint on file.    History of Present Illness:    Kristy Archer is a 81 y.o. female.  Patient has past medical history of chronic atrial fibrillation and not on anticoagulation because of high risk of fall.  She has dilated ascending aorta, mixed dyslipidemia.  She denies any problems at this time and takes care of activities of daily living.  No chest pain orthopnea or PND.  She is being treated for urinary tract infection by primary care doctor and is on antibiotic.  At the time of my evaluation, the patient is alert awake oriented and in no distress.  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 -Home meds: Sotalol 80 mg twice daily, Xarelto 20 mg daily -Sotalol dose had to be temporarily reduced due to slight decrease in renal functions however it was returned to her home dose.  -Heart rate mostly in the 50s, QTC on EKG 11/30/18  478 msec -Xarelto had to be held on admission for insertion of chest tube    Chronic back pain 11/25/2018   Last Assessment & Plan:  Formatting of this note might be different from the original. - Continue home pain medication  - Added miralax daily prn   Chronic pain syndrome 12/03/2021   Constipation 12/03/2018   Last Assessment & Plan:  Formatting of this note might be different from the original. 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 - History of hiatal hernia status post fundoplication in 2005.  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  History of multiple EGDs for dilatation. - S/p thoracotomy with partial gastrectomy/esophagectomy in 2008 with thoracic esophagogastric    Hypomagnesemia 12/01/2018   Last Assessment & Plan:  Formatting of this note might be  different from the original. Replaced during hospitalization.   Hypothyroidism 01/16/2014   Last Assessment & Plan:  Formatting of this note might be different from the original. 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   cyanocobalamin 1000 MCG tablet Take 1,000 mcg by mouth daily.   famotidine (PEPCID) 40 MG tablet Take 40 mg by mouth at bedtime.   levothyroxine (SYNTHROID) 88 MCG tablet Take 88 mcg by mouth every morning.   omeprazole (PRILOSEC) 40 MG capsule Take 40 mg by mouth 2 (two) times daily.   ondansetron (ZOFRAN) 8 MG tablet Take 8 mg by mouth 2 (two) times daily as needed for nausea/vomiting.   oxyCODONE-acetaminophen (PERCOCET) 10-325 MG tablet Take 1 tablet by mouth 4 (four) times daily as needed for pain.   sertraline (ZOLOFT) 100 MG tablet Take 100 mg by mouth daily.   simvastatin (ZOCOR) 20 MG tablet Take 20 mg by mouth every evening.   sotalol (BETAPACE) 80 MG tablet Take 80 mg by mouth every 12  (twelve) hours.   sucralfate (CARAFATE) 1 g tablet Take 1 g by mouth 4 (four) times daily.     Allergies:   Codeine, Linezolid, and Sulfa antibiotics   Social History   Socioeconomic History   Marital status: Divorced    Spouse name: Not on file   Number of children: Not on file   Years of education: Not on file   Highest education level: Not on file  Occupational History   Not on file  Tobacco Use   Smoking status: Never   Smokeless tobacco: Never  Substance and Sexual Activity   Alcohol use: Not on file   Drug use: Not on file   Sexual activity: Not on file  Other Topics Concern   Not on file  Social History Narrative   Not on file   Social Determinants of Health   Financial Resource Strain: Not on file  Food Insecurity: Not on file  Transportation Needs: Not on file  Physical Activity: Not on file  Stress: Not on file  Social Connections: Not on file     Family  History: The patient's family history includes Cancer in her mother; Diabetes in her father and mother; Heart disease in her father; Hypertension in her father.  ROS:   Please see the history of present illness.    All other systems reviewed and are negative.  EKGs/Labs/Other Studies Reviewed:    The following studies were reviewed today: I discussed my findings with the patient at length.  Echo and stress test are unremarkable.  CT scan of the chest has ruled out PE.  Pulmonary findings to be followed by primary care.   Recent Labs: 11/24/2021: ALT 13; Hemoglobin 13.7; Platelets 234 12/05/2021: BNP 279.0; BUN 13; Creatinine, Ser 0.66; Potassium 4.1; Sodium 143  Recent Lipid Panel No results found for: CHOL, TRIG, HDL, CHOLHDL, VLDL, LDLCALC, LDLDIRECT  Physical Exam:    VS:  BP 138/66   Pulse 62   Ht 5\' 2"  (1.575 m)   Wt 108 lb (49 kg)   SpO2 93%   BMI 19.75 kg/m     Wt Readings from Last 3 Encounters:  02/25/22 108 lb (49 kg)  12/16/21 117 lb (53.1 kg)  12/05/21 117 lb 9.6 oz (53.3 kg)      GEN: Patient is in no acute distress HEENT: Normal NECK: No JVD; No carotid bruits LYMPHATICS: No lymphadenopathy CARDIAC: Hear sounds regular, 2/6 systolic murmur at the apex. RESPIRATORY:  Clear to auscultation without rales, wheezing or rhonchi  ABDOMEN: Soft, non-tender, non-distended MUSCULOSKELETAL:  No edema; No deformity  SKIN: Warm and dry NEUROLOGIC:  Alert and oriented x 3 PSYCHIATRIC:  Normal affect   Signed, 12/07/21, MD  02/25/2022 3:32 PM    [facility]

## 2022-03-27 ENCOUNTER — Ambulatory Visit: Payer: Self-pay | Admitting: Family Medicine

## 2022-03-27 DIAGNOSIS — H0100A Unspecified blepharitis right eye, upper and lower eyelids: Secondary | ICD-10-CM

## 2022-03-27 MED ORDER — POLYMYXIN B-TRIMETHOPRIM 10000-0.1 UNIT/ML-% OP SOLN: 1.0000 [drp] | OPHTHALMIC | 0 refills | Status: DC

## 2022-03-27 NOTE — Progress Notes (Unsigned)
   Established Patient Office Visit  Subjective   Patient ID: Kristy Archer, female    DOB: May 31, 1941  Age: 81 y.o. MRN: [id number]  No chief complaint on file.   HPI  {History (Optional):23778}  ROS    Objective:     There were no vitals taken for this visit. {Vitals History (Optional):23777}  Physical Exam   No results found for any visits on 03/27/22.  {Labs (Optional):23779}  The ASCVD Risk score (Arnett DK, et al., 2019) failed to calculate for the following reasons:   The 2019 ASCVD risk score is only valid for ages 48 to 73    Assessment & Plan:   Problem List Items Addressed This Visit   None Visit Diagnoses     Blepharitis of upper and lower eyelids of both eyes, unspecified type    -  Primary       No follow-ups on file.    Haydee Salter, MD

## 2022-06-27 ENCOUNTER — Other Ambulatory Visit: Payer: Self-pay | Admitting: Family Medicine

## 2022-07-23 ENCOUNTER — Other Ambulatory Visit: Payer: Self-pay | Admitting: Family Medicine

## 2022-09-03 DIAGNOSIS — D518 Other vitamin B12 deficiency anemias: Secondary | ICD-10-CM | POA: Diagnosis not present

## 2022-09-03 DIAGNOSIS — E782 Mixed hyperlipidemia: Secondary | ICD-10-CM | POA: Diagnosis not present

## 2022-09-03 DIAGNOSIS — M150 Primary generalized (osteo)arthritis: Secondary | ICD-10-CM | POA: Diagnosis not present

## 2022-09-03 DIAGNOSIS — I10 Essential (primary) hypertension: Secondary | ICD-10-CM | POA: Diagnosis not present

## 2022-09-03 DIAGNOSIS — K219 Gastro-esophageal reflux disease without esophagitis: Secondary | ICD-10-CM | POA: Diagnosis not present

## 2022-09-03 DIAGNOSIS — E038 Other specified hypothyroidism: Secondary | ICD-10-CM | POA: Diagnosis not present

## 2022-09-03 DIAGNOSIS — E559 Vitamin D deficiency, unspecified: Secondary | ICD-10-CM | POA: Diagnosis not present

## 2022-09-03 DIAGNOSIS — E119 Type 2 diabetes mellitus without complications: Secondary | ICD-10-CM | POA: Diagnosis not present

## 2022-09-04 DIAGNOSIS — F419 Anxiety disorder, unspecified: Secondary | ICD-10-CM | POA: Diagnosis not present

## 2022-09-18 DIAGNOSIS — D518 Other vitamin B12 deficiency anemias: Secondary | ICD-10-CM | POA: Diagnosis not present

## 2022-09-18 DIAGNOSIS — E119 Type 2 diabetes mellitus without complications: Secondary | ICD-10-CM | POA: Diagnosis not present

## 2022-09-18 DIAGNOSIS — F3342 Major depressive disorder, recurrent, in full remission: Secondary | ICD-10-CM | POA: Diagnosis not present

## 2022-09-18 DIAGNOSIS — E559 Vitamin D deficiency, unspecified: Secondary | ICD-10-CM | POA: Diagnosis not present

## 2022-09-18 DIAGNOSIS — E782 Mixed hyperlipidemia: Secondary | ICD-10-CM | POA: Diagnosis not present

## 2022-09-18 DIAGNOSIS — M150 Primary generalized (osteo)arthritis: Secondary | ICD-10-CM | POA: Diagnosis not present

## 2022-09-18 DIAGNOSIS — I10 Essential (primary) hypertension: Secondary | ICD-10-CM | POA: Diagnosis not present

## 2022-09-18 DIAGNOSIS — E038 Other specified hypothyroidism: Secondary | ICD-10-CM | POA: Diagnosis not present

## 2022-09-18 DIAGNOSIS — M810 Age-related osteoporosis without current pathological fracture: Secondary | ICD-10-CM | POA: Diagnosis not present

## 2022-09-23 DIAGNOSIS — E119 Type 2 diabetes mellitus without complications: Secondary | ICD-10-CM | POA: Diagnosis not present

## 2022-09-23 DIAGNOSIS — E038 Other specified hypothyroidism: Secondary | ICD-10-CM | POA: Diagnosis not present

## 2022-09-23 DIAGNOSIS — I10 Essential (primary) hypertension: Secondary | ICD-10-CM | POA: Diagnosis not present

## 2022-09-23 DIAGNOSIS — E782 Mixed hyperlipidemia: Secondary | ICD-10-CM | POA: Diagnosis not present

## 2022-09-23 DIAGNOSIS — F3342 Major depressive disorder, recurrent, in full remission: Secondary | ICD-10-CM | POA: Diagnosis not present

## 2022-09-23 DIAGNOSIS — M810 Age-related osteoporosis without current pathological fracture: Secondary | ICD-10-CM | POA: Diagnosis not present

## 2022-09-23 DIAGNOSIS — E559 Vitamin D deficiency, unspecified: Secondary | ICD-10-CM | POA: Diagnosis not present

## 2022-09-23 DIAGNOSIS — D518 Other vitamin B12 deficiency anemias: Secondary | ICD-10-CM | POA: Diagnosis not present

## 2022-09-23 DIAGNOSIS — M150 Primary generalized (osteo)arthritis: Secondary | ICD-10-CM | POA: Diagnosis not present

## 2022-09-28 DIAGNOSIS — N390 Urinary tract infection, site not specified: Secondary | ICD-10-CM | POA: Diagnosis not present

## 2022-09-28 DIAGNOSIS — R0789 Other chest pain: Secondary | ICD-10-CM | POA: Diagnosis not present

## 2022-10-27 DIAGNOSIS — H9209 Otalgia, unspecified ear: Secondary | ICD-10-CM | POA: Diagnosis not present

## 2022-10-27 DIAGNOSIS — H6122 Impacted cerumen, left ear: Secondary | ICD-10-CM | POA: Diagnosis not present

## 2022-10-28 ENCOUNTER — Telehealth: Payer: Self-pay | Admitting: Cardiology

## 2022-10-28 ENCOUNTER — Other Ambulatory Visit: Payer: Self-pay

## 2022-10-28 NOTE — Telephone Encounter (Signed)
Called patient's daughter Polly Cobia and informed her of Dr. Joya Gaskins recommendation below:  "Lets get her into the office tomorrow"  An appointment was scheduled for the patient to see Dr. Bettina Gavia at 11:00 am on 10/29/22. Tamare had no further questions at this time.

## 2022-10-28 NOTE — Telephone Encounter (Signed)
Patient c/o Palpitations:  High priority if patient c/o lightheadedness, shortness of breath, or chest pain  How long have you had palpitations/irregular HR/ Afib? Doesn't know how long she has been in A-fib Are you having the symptoms now? no  Are you currently experiencing lightheadedness, SOB or CP? She was lightheaded yesterday when she went to urgent care.    Do you have a history of afib (atrial fibrillation) or irregular heart rhythm? yes  Have you checked your BP or HR? (document readings if available): daughter is not sure but thinks it was over 150 whe she was at urgent care.   Are you experiencing any other symptoms? no   Patient went to urgent care yesterday because she was feeling dizzy and her ears were impacted. When she went there they told her she was in A-fib.  Her daughter called her PCP for an appt and they told her to give [place] a call.

## 2022-10-28 NOTE — Telephone Encounter (Signed)
Called patient's daughter Kristy Archer and she reported that the patient was feeling dizzy yesterday and she went to the urgent care. The patient has not reported any dizziness today. While at urgent care they noticed that she was in Afib and they referred her to follow up with her PCP. Her PCP referred her to see Dr. Geraldo Pitter. Patient has a history of A-fib and her heart rate has been as high as 156. The daughter states that the patient is not SOB and has no lower extremity edema. Please advise.

## 2022-10-29 ENCOUNTER — Encounter: Payer: Self-pay | Admitting: Cardiology

## 2022-10-29 ENCOUNTER — Ambulatory Visit: Payer: Medicare HMO | Attending: Cardiology | Admitting: Cardiology

## 2022-10-29 VITALS — BP 176/76 | HR 57 | Ht 62.0 in | Wt 100.6 lb

## 2022-10-29 DIAGNOSIS — I480 Paroxysmal atrial fibrillation: Secondary | ICD-10-CM | POA: Diagnosis not present

## 2022-10-29 DIAGNOSIS — I10 Essential (primary) hypertension: Secondary | ICD-10-CM | POA: Diagnosis not present

## 2022-10-29 NOTE — Progress Notes (Signed)
Cardiology Office Note:    Date:  10/29/2022   ID:  Kristy Archer, DOB Jan 13, 1941, MRN [id number]  PCP:  Kristy Shutter, NP  Cardiologist:  Kristy More, MD    Referring MD: Kristy Shutter, NP    ASSESSMENT:    1. Paroxysmal atrial fibrillation (HCC)   2. Essential hypertension    PLAN:    In order of problems listed above:  Fortunately in sinus rhythm tolerates and continue sotalol she has no sign of toxicity normal QT interval. She is not anticoagulated she is quite frail and I agree with this approach Very cautious approach to treating hypertension continue her minimum dose of amlodipine that record trend blood pressures at home and in her age group and shoot for systolic 1 62-3 50. She is quite pale her granddaughter tells me she has lab work checked recently has an upcoming appointment soon I told her to have her hemoglobin rechecked   Next appointment: 6 months   Medication Adjustments/Labs and Tests Ordered: Current medicines are reviewed at length with the patient today.  Concerns regarding medicines are outlined above.  No orders of the defined types were placed in this encounter.  No orders of the defined types were placed in this encounter.   Chief Complaint  Patient presents with   Follow-up   Atrial Fibrillation    History of Present Illness:    Kristy Archer is a 82 y.o. female with a hx of atrial fibrillation she was in sinus rhythm last visit 12/05/2021 with a phone call to the office yesterday of feeling badly with heart rates exceeding 150 bpm.  She has not been anticoagulated because of falls.  She is on an antiarrhythmic drug sotalol 80 mg every 12 hours to suppress atrial fibrillation and takes low-dose aspirin.  Compliance with diet, lifestyle and medications: Yes  There is a question of her being in atrial fibrillation when she was at urgent care is seen today in sinus rhythm She has not had rapid heart rhythms noted palpitation edema  shortness of breath or chest pain She was also placed on low-dose amlodipine for hypertension was out for several days just restarted On asked him to purchase both mobile Kardia device to screen for atrial fibrillation at home and a validated blood pressure device with a small cuff to trend her blood pressures at home. Past Medical History:  Diagnosis Date   Age-related osteoporosis without current pathological fracture 12/03/2021   Chest pain    Chest pain of uncertain etiology 7/62/8315   Chronic atrial fibrillation, unspecified ([place]) 01/16/2014   Last Assessment & Plan:  Formatting of this note might be different from the original. -Home meds: Sotalol 80 mg twice daily, Xarelto 20 mg daily -Sotalol dose had to be temporarily reduced due to slight decrease in renal functions however it was returned to her home dose.  -Heart rate mostly in the 50s, QTC on EKG 11/30/18  478 msec -Xarelto had to be held on admission for insertion of chest tube    Chronic back pain 11/25/2018   Last Assessment & Plan:  Formatting of this note might be different from the original. - Continue home pain medication  - Added miralax daily prn   Chronic pain syndrome 12/03/2021   Constipation 12/03/2018   Last Assessment & Plan:  Formatting of this note might be different from the original. 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 - History of hiatal hernia status post fundoplication in 0630.  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  History of multiple EGDs for dilatation. - S/p thoracotomy with partial gastrectomy/esophagectomy in 2008 with thoracic esophagogastric    Hypomagnesemia 12/01/2018   Last Assessment & Plan:  Formatting of this note might be different from the original. Replaced during hospitalization.   Hypothyroidism 01/16/2014   Last Assessment & Plan:  Formatting of this note might be different from the original. Continue levothyroxine 88 mcg   Irritable bowel syndrome 12/03/2021   Mild recurrent major depression ([place]) 12/03/2021   Mixed hyperlipidemia 12/03/2021   OSA (obstructive sleep apnea) 04/16/2021   Other long term (current) drug therapy 12/03/2021   Other vitamin B12 deficiency anemias 12/03/2021   Pain in left hip 12/03/2021   Pain in right foot 12/03/2021   Pain in right knee 12/03/2021   Primary generalized (osteo)arthritis 12/03/2021   Primary osteoarthritis, right ankle and foot 12/03/2021   Recurrent major depression in full remission ([place]) 12/03/2021   Tinea unguium 12/03/2021   Type 2 diabetes mellitus without complications ([place]) 1/60/1093   Unspecified severe protein-calorie malnutrition ([place]) 12/03/2021   Urinary tract infectious disease 12/03/2021   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   aspirin EC 81 MG tablet Take 81 mg by mouth daily.   cephALEXin (KEFLEX) 250 MG capsule Take 250 mg by mouth daily.   cyanocobalamin 1000 MCG tablet Take 1,000 mcg by mouth daily.   famotidine (PEPCID) 40 MG tablet Take 40 mg by mouth at bedtime.   levothyroxine (SYNTHROID) 88 MCG tablet Take 88 mcg by mouth every morning.   meclizine (ANTIVERT) 25 MG tablet Take 25 mg by  mouth 3 (three) times daily as needed for dizziness.   omeprazole (PRILOSEC) 40 MG capsule Take 40 mg by mouth 2 (two) times daily.   ondansetron (ZOFRAN) 8 MG tablet Take 8 mg by mouth 2 (two) times daily as needed for nausea/vomiting.   oxyCODONE-acetaminophen (PERCOCET) 10-325 MG tablet Take 1 tablet by mouth 4 (four) times daily as needed for pain.   sertraline (ZOLOFT) 100 MG tablet Take 100 mg by mouth daily.   simvastatin (ZOCOR) 20 MG tablet Take 20 mg by mouth every evening.   sotalol (BETAPACE) 80 MG tablet Take 80 mg by mouth every 12 (twelve) hours.   sucralfate (CARAFATE) 1 g tablet Take 1 g by mouth 4 (four) times daily.   trimethoprim-polymyxin b (POLYTRIM) ophthalmic solution Place 1 drop into both eyes every 4 (four) hours.     Allergies:   Codeine, Linezolid, and Sulfa antibiotics   Social History   Socioeconomic History   Marital status: Divorced    Spouse name: Not on file   Number of children: Not  on file   Years of education: Not on file   Highest education level: Not on file  Occupational History   Not on file  Tobacco Use   Smoking status: Never   Smokeless tobacco: Never  Substance and Sexual Activity   Alcohol use: Not on file   Drug use: Not on file   Sexual activity: Not on file  Other Topics Concern   Not on file  Social History Narrative   Not on file   Social Determinants of Health   Financial Resource Strain: Not on file  Food Insecurity: Not on file  Transportation Needs: Not on file  Physical Activity: Not on file  Stress: Not on file  Social Connections: Not on file     Family History: The patient's family history includes Cancer in her mother; Diabetes in her father and mother; Heart disease in her father; Hypertension in her father. ROS:   Please see the history of present illness.    All other systems reviewed and are negative.  EKGs/Labs/Other Studies Reviewed:    The following studies were reviewed today:  EKG:  EKG  ordered today and personally reviewed.  The ekg ordered today demonstrates sinus bradycardia 57 bpm normal EKG and the QT interval is normal on sotalol  Recent Labs: 11/24/2021: ALT 13; Hemoglobin 13.7; Platelets 234 12/05/2021: BNP 279.0; BUN 13; Creatinine, Ser 0.66; Potassium 4.1; Sodium 143  Recent Lipid Panel No results found for: "CHOL", "TRIG", "HDL", "CHOLHDL", "VLDL", "LDLCALC", "LDLDIRECT"  Physical Exam:    VS:  BP (!) 176/76 (BP Location: Left Arm, Patient Position: Sitting)   Pulse (!) 57   Ht 5\' 2"  (1.575 m)   Wt 100 lb 9.6 oz (45.6 kg)   SpO2 97%   BMI 18.40 kg/m     Wt Readings from Last 3 Encounters:  10/29/22 100 lb 9.6 oz (45.6 kg)  02/25/22 108 lb (49 kg)  12/16/21 117 lb (53.1 kg)     GEN:  Well nourished, well developed in no acute distress HEENT: Normal NECK: No JVD; No carotid bruits LYMPHATICS: No lymphadenopathy CARDIAC: RRR, no murmurs, rubs, gallops RESPIRATORY:  Clear to auscultation without rales, wheezing or rhonchi  ABDOMEN: Soft, non-tender, non-distended MUSCULOSKELETAL:  No edema; No deformity  SKIN: Warm and dry NEUROLOGIC:  Alert and oriented x 3 PSYCHIATRIC:  Normal affect   Seen with Leighton Ruff, CMA chaperone  Signed, Kristy More, MD  10/29/2022 11:23 AM    [place]

## 2022-10-29 NOTE — Patient Instructions (Addendum)
Medication Instructions:  Your physician recommends that you continue on your current medications as directed. Please refer to the Current Medication list given to you today.  *If you need a refill on your cardiac medications before your next appointment, please call your pharmacy*   Lab Work: None If you have labs (blood work) drawn today and your tests are completely normal, you will receive your results only by: [place] (if you have MyChart) OR A paper copy in the mail If you have any lab test that is abnormal or we need to change your treatment, we will call you to review the results.   Testing/Procedures: None   Follow-Up: At [facility], you and your health needs are our priority.  As part of our continuing mission to provide you with exceptional heart care, we have created designated Provider Care Teams.  These Care Teams include your primary Cardiologist (physician) and Advanced Practice Providers (APPs -  Physician Assistants and Nurse Practitioners) who all work together to provide you with the care you need, when you need it.  We recommend signing up for the patient portal called "MyChart".  Sign up information is provided on this After Visit Summary.  MyChart is used to connect with patients for Virtual Visits (Telemedicine).  Patients are able to view lab/test results, encounter notes, upcoming appointments, etc.  Non-urgent messages can be sent to your provider as well.   To learn more about what you can do with MyChart, go to [email or web address].    Your next appointment:   6 month(s)  Provider:   Jyl Heinz, MD    Other Instructions Get an [employer] blood pressure device.  Get a small 100 ml blood pressure cuff.  Get mobile kardia to check for atrial fibrillation  This visit was accompanied by Leighton Ruff.    [email or web address] [email or web address]        FDA-cleared, clinical grade mobile EKG monitor:  Jodelle Red is the most clinically-validated mobile EKG used by the world's leading cardiac care medical professionals With Basic service, know instantly if your heart rhythm is normal or if atrial fibrillation is detected, and email the last single EKG recording to yourself or your doctor Premium service, available for purchase through the Kardia app for $9.99 per month or $99 per year, includes unlimited history and storage of your EKG recordings, a monthly EKG summary report to share with your doctor, along with the ability to track your blood pressure, activity and weight Includes one KardiaMobile phone clip FREE SHIPPING: Standard delivery 1-3 business days. Orders placed by 11:00am PST will ship that afternoon. Otherwise, will ship next business day. All orders ship via [employer] from [place], [place]  Tips to measure your blood pressure correctly  To determine whether you have hypertension, a medical professional will take a blood pressure reading. How you prepare for the test, the position of your arm, and other factors can change a blood pressure reading by 10% or more. That could be enough to hide high blood pressure, start you on a drug you don't really need, or lead your doctor to incorrectly adjust your medications. National and international guidelines offer specific instructions for measuring blood pressure. If a doctor, nurse, or medical assistant isn't doing it right, don't hesitate to ask him or her to get with the guidelines. Here's what you can do to ensure a correct reading:  Don't drink a caffeinated beverage or smoke during the 30 minutes before the test.  Sit  quietly for five minutes before the test begins.  During the measurement, sit in a chair with your feet on the floor and your arm supported so your elbow is at about heart level.  The inflatable part of the cuff should completely cover at least 80% of your upper arm, and the cuff should be placed on bare skin,  not over a shirt.  Don't talk during the measurement.  Have your blood pressure measured twice, with a brief break in between. If the readings are different by 5 points or more, have it done a third time. There are times to break these rules. If you sometimes feel lightheaded when getting out of bed in the morning or when you stand after sitting, you should have your blood pressure checked while seated and then while standing to see if it falls from one position to the next. Because blood pressure varies throughout the day, your doctor will rarely diagnose hypertension on the basis of a single reading. Instead, he or she will want to confirm the measurements on at least two occasions, usually within a few weeks of one another. The exception to this rule is if you have a blood pressure reading of 180/110 mm Hg or higher. A result this high usually calls for prompt treatment. It's also a good idea to have your blood pressure measured in both arms at least once, since the reading in one arm (usually the right) may be higher than that in the left. A 2014 study in The American Journal of Medicine of nearly 3,400 people found average arm- to-arm differences in systolic blood pressure of about 5 points. The higher number should be used to make treatment decisions. In 2017, new guidelines from the [place], the [employer] of Cardiology, and nine other health organizations lowered the diagnosis of high blood pressure to 130/80 mm Hg or higher for all adults. The guidelines also redefined the various blood pressure categories to now include normal, elevated, Stage 1 hypertension, Stage 2 hypertension, and hypertensive crisis (see "Blood pressure categories"). Blood pressure categories  Blood pressure category SYSTOLIC (upper number)  DIASTOLIC (lower number)  Normal Less than 120 mm Hg and Less than 80 mm Hg  Elevated 120-129 mm Hg and Less than 80 mm Hg  High blood pressure: Stage 1  hypertension 130-139 mm Hg or 80-89 mm Hg  High blood pressure: Stage 2 hypertension 140 mm Hg or higher or 90 mm Hg or higher  Hypertensive crisis (consult your doctor immediately) Higher than 180 mm Hg and/or Higher than 120 mm Hg  Source: American Heart Association and American Stroke Association. For more on getting your blood pressure under control, buy Controlling Your Blood Pressure, a Special Health Report from [facility].

## 2022-10-29 NOTE — Discharge Summary (Signed)
This visit was accompanied by America Sandall, CMA.  

## 2022-10-30 DIAGNOSIS — F419 Anxiety disorder, unspecified: Secondary | ICD-10-CM | POA: Diagnosis not present

## 2022-11-03 DIAGNOSIS — R1032 Left lower quadrant pain: Secondary | ICD-10-CM | POA: Diagnosis not present

## 2022-11-03 DIAGNOSIS — N952 Postmenopausal atrophic vaginitis: Secondary | ICD-10-CM | POA: Diagnosis not present

## 2022-11-03 DIAGNOSIS — N390 Urinary tract infection, site not specified: Secondary | ICD-10-CM | POA: Diagnosis not present

## 2022-11-03 DIAGNOSIS — N3281 Overactive bladder: Secondary | ICD-10-CM | POA: Diagnosis not present

## 2022-11-03 DIAGNOSIS — R1031 Right lower quadrant pain: Secondary | ICD-10-CM | POA: Diagnosis not present

## 2022-11-04 DIAGNOSIS — E559 Vitamin D deficiency, unspecified: Secondary | ICD-10-CM | POA: Diagnosis not present

## 2022-11-04 DIAGNOSIS — F3342 Major depressive disorder, recurrent, in full remission: Secondary | ICD-10-CM | POA: Diagnosis not present

## 2022-11-04 DIAGNOSIS — M150 Primary generalized (osteo)arthritis: Secondary | ICD-10-CM | POA: Diagnosis not present

## 2022-11-04 DIAGNOSIS — E119 Type 2 diabetes mellitus without complications: Secondary | ICD-10-CM | POA: Diagnosis not present

## 2022-11-04 DIAGNOSIS — D518 Other vitamin B12 deficiency anemias: Secondary | ICD-10-CM | POA: Diagnosis not present

## 2022-11-04 DIAGNOSIS — M810 Age-related osteoporosis without current pathological fracture: Secondary | ICD-10-CM | POA: Diagnosis not present

## 2022-11-04 DIAGNOSIS — E038 Other specified hypothyroidism: Secondary | ICD-10-CM | POA: Diagnosis not present

## 2022-11-04 DIAGNOSIS — I10 Essential (primary) hypertension: Secondary | ICD-10-CM | POA: Diagnosis not present

## 2022-11-04 DIAGNOSIS — E782 Mixed hyperlipidemia: Secondary | ICD-10-CM | POA: Diagnosis not present

## 2022-11-12 DIAGNOSIS — M25561 Pain in right knee: Secondary | ICD-10-CM | POA: Diagnosis not present

## 2022-11-12 DIAGNOSIS — J690 Pneumonitis due to inhalation of food and vomit: Secondary | ICD-10-CM | POA: Diagnosis not present

## 2022-11-12 DIAGNOSIS — I510 Cardiac septal defect, acquired: Secondary | ICD-10-CM | POA: Diagnosis not present

## 2022-11-12 DIAGNOSIS — K219 Gastro-esophageal reflux disease without esophagitis: Secondary | ICD-10-CM | POA: Diagnosis not present

## 2022-11-12 DIAGNOSIS — J189 Pneumonia, unspecified organism: Secondary | ICD-10-CM | POA: Diagnosis not present

## 2022-11-12 DIAGNOSIS — J90 Pleural effusion, not elsewhere classified: Secondary | ICD-10-CM | POA: Diagnosis not present

## 2022-11-12 DIAGNOSIS — I4891 Unspecified atrial fibrillation: Secondary | ICD-10-CM | POA: Diagnosis not present

## 2022-11-12 DIAGNOSIS — E43 Unspecified severe protein-calorie malnutrition: Secondary | ICD-10-CM | POA: Diagnosis not present

## 2022-11-12 DIAGNOSIS — R079 Chest pain, unspecified: Secondary | ICD-10-CM | POA: Diagnosis not present

## 2022-11-12 DIAGNOSIS — I10 Essential (primary) hypertension: Secondary | ICD-10-CM | POA: Diagnosis not present

## 2022-11-12 DIAGNOSIS — E7800 Pure hypercholesterolemia, unspecified: Secondary | ICD-10-CM | POA: Diagnosis not present

## 2022-11-12 DIAGNOSIS — R918 Other nonspecific abnormal finding of lung field: Secondary | ICD-10-CM | POA: Diagnosis not present

## 2022-11-12 DIAGNOSIS — R0789 Other chest pain: Secondary | ICD-10-CM | POA: Diagnosis not present

## 2022-11-12 DIAGNOSIS — E876 Hypokalemia: Secondary | ICD-10-CM | POA: Diagnosis not present

## 2022-11-12 DIAGNOSIS — R091 Pleurisy: Secondary | ICD-10-CM | POA: Diagnosis not present

## 2022-11-12 DIAGNOSIS — Z681 Body mass index (BMI) 19 or less, adult: Secondary | ICD-10-CM | POA: Diagnosis not present

## 2022-11-12 DIAGNOSIS — R9431 Abnormal electrocardiogram [ECG] [EKG]: Secondary | ICD-10-CM | POA: Diagnosis not present

## 2022-11-12 DIAGNOSIS — R071 Chest pain on breathing: Secondary | ICD-10-CM | POA: Diagnosis not present

## 2022-11-15 IMAGING — CT CT ANGIO CHEST
2 of 10 series · 18 of 36 positions shown · IV contrast (Omnipaque)
Comparison: 10/13/2019.

CLINICAL DATA: Shortness of breath with exertion. Recent
hospitalization. Elevated D-dimer.

EXAM:
CT ANGIOGRAPHY CHEST WITH CONTRAST
TECHNIQUE: Multidetector CT imaging of the chest was performed using the
standard protocol during bolus administration of intravenous
contrast. Multiplanar CT image reconstructions and MIPs were
obtained to evaluate the vascular anatomy.

[Series 8: pe thins · axial · 0.73mm/px · z∈[-188,+8]mm · 17 of 222 slices shown]
[im 13/222  lung]
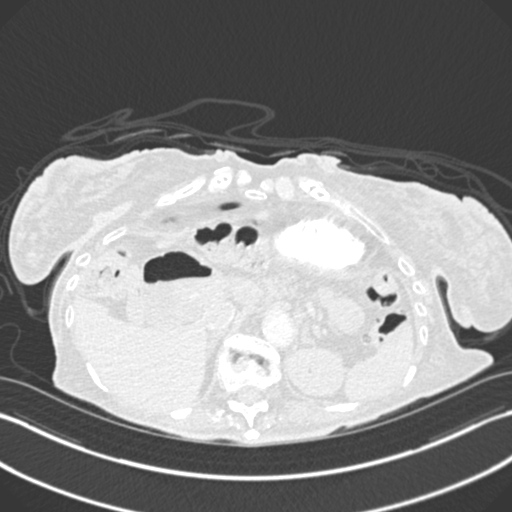
[im 25/222  mediastinal]
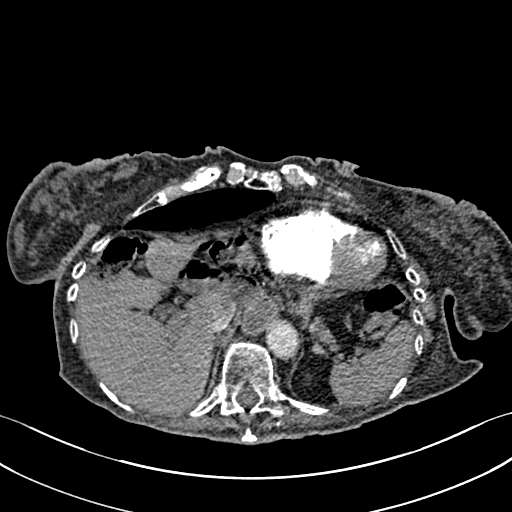
[im 37/222  lung]
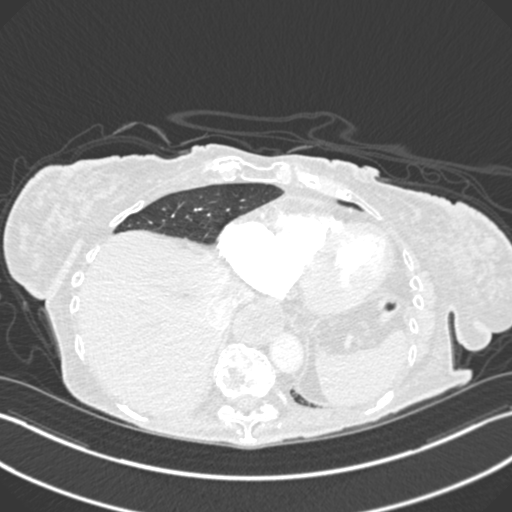
[im 50/222  mediastinal]
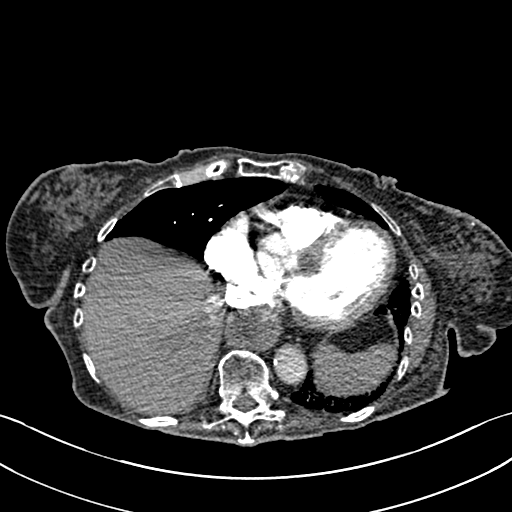
[im 62/222  lung]
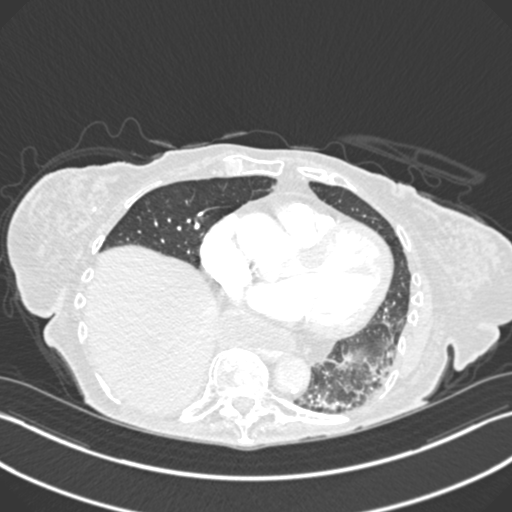
[im 74/222  mediastinal]
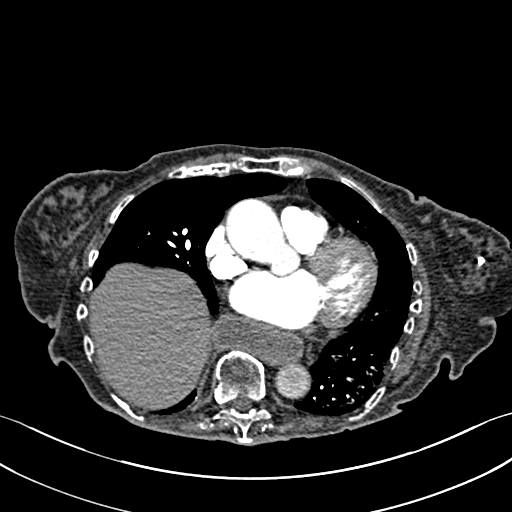
[im 86/222  lung]
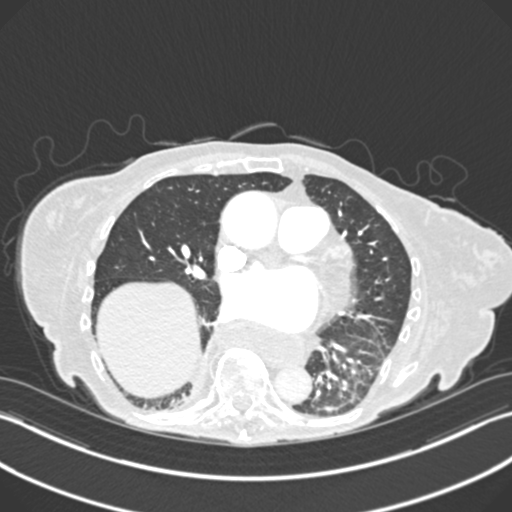
[im 99/222  mediastinal]
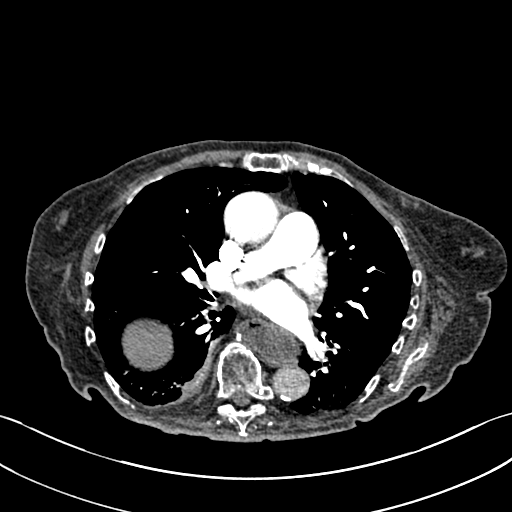
[im 111/222  lung]
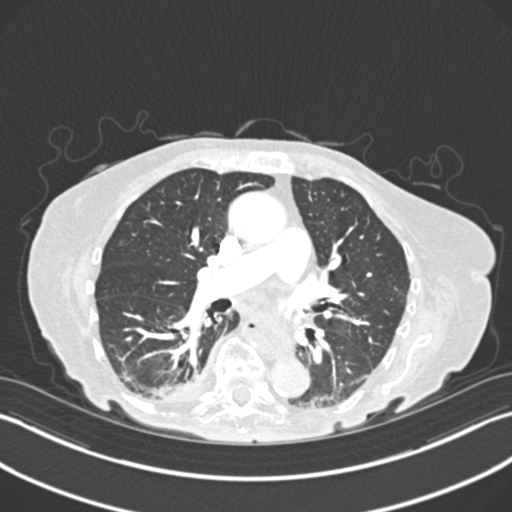
[im 123/222  mediastinal]
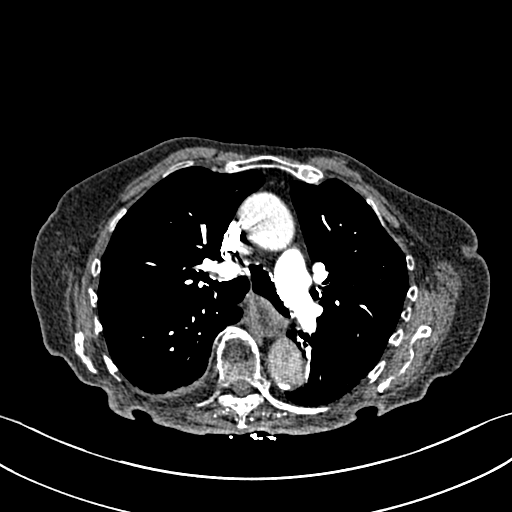
[im 136/222  lung]
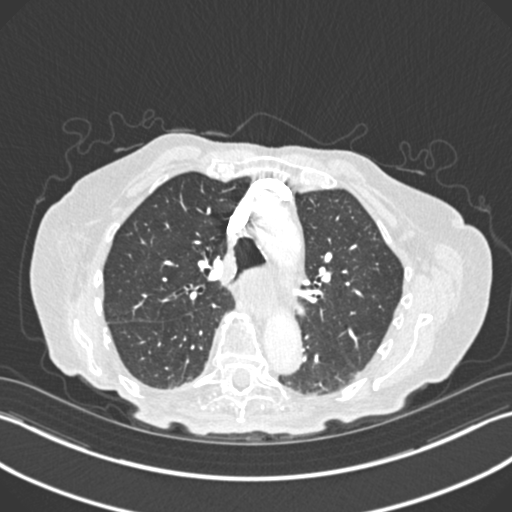
[im 148/222  mediastinal]
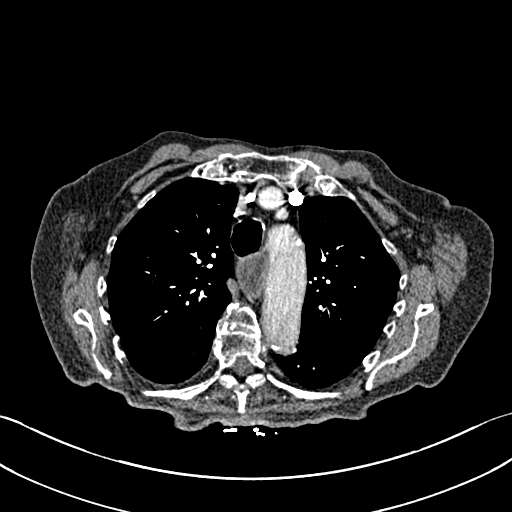
[im 160/222  lung]
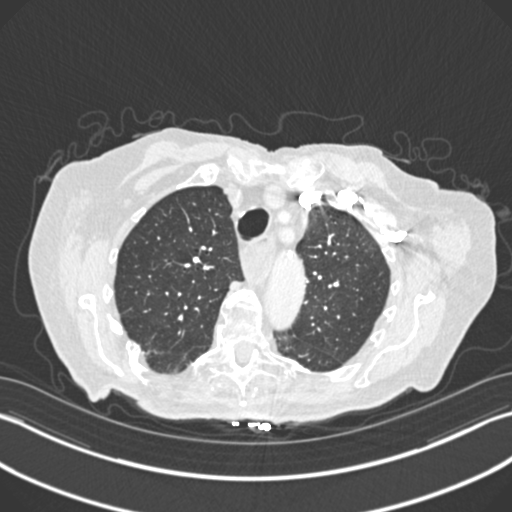
[im 172/222  mediastinal]
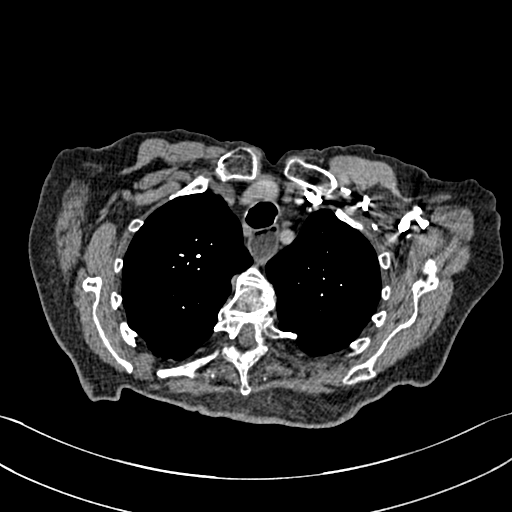
[im 185/222  lung]
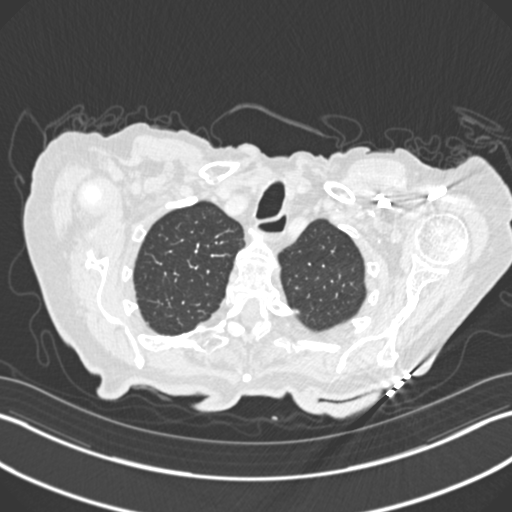
[im 197/222  mediastinal]
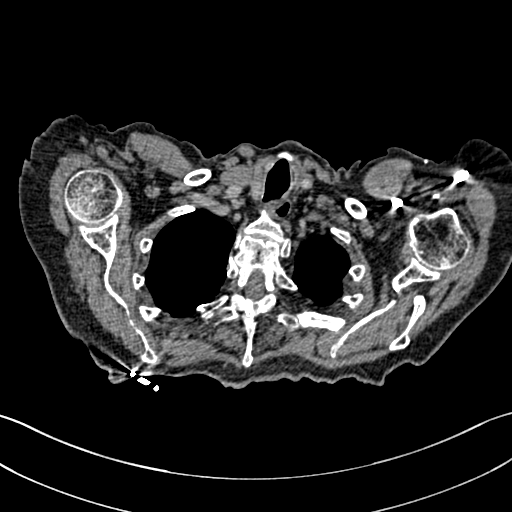
[im 209/222  lung]
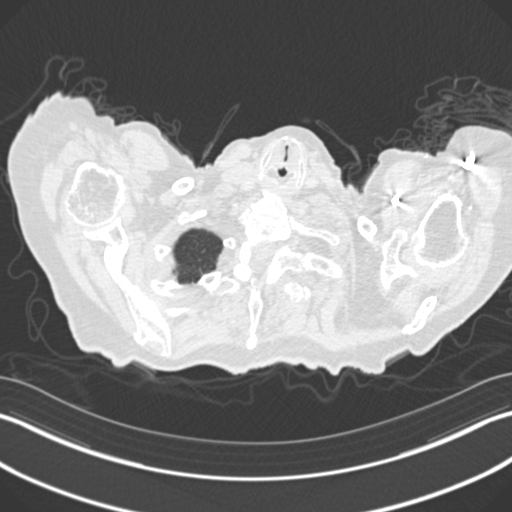

[Series 9: pe coronal mpr · coronal · 0.51mm/px · 1 of 116 slices shown]
[im 58/116  mediastinal]
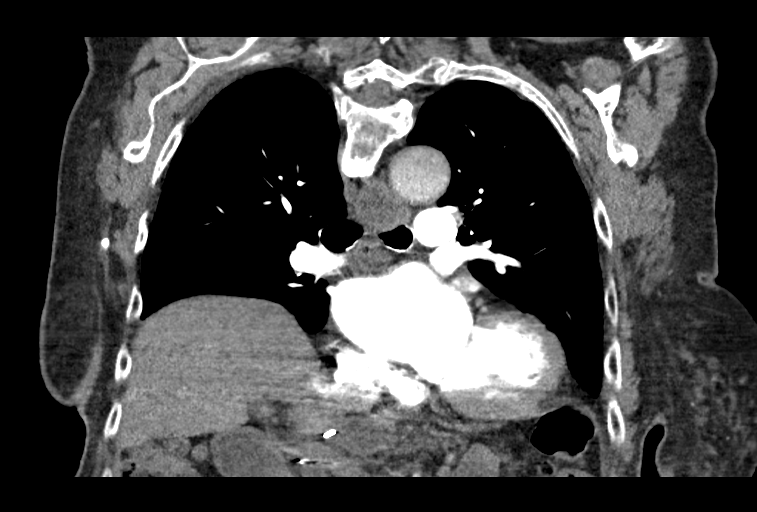

[18 of 36 positions shown; findings below may reference images not displayed]

RADIATION DOSE REDUCTION: This exam was performed according to the
departmental dose-optimization program which includes automated
exposure control, adjustment of the mA and/or kV according to
patient size and/or use of iterative reconstruction technique.

CONTRAST:  60mL OMNIPAQUE IOHEXOL 350 MG/ML SOLN
FINDINGS: Cardiovascular: Pulmonary arteries are well opacified. There is no
evidence of a pulmonary embolism.

Heart is normal in size. No pericardial effusion. Ascending aorta
dilated to 4 cm, without change from the prior CT. No dissection.
Mild atherosclerosis. Aortic arch branch vessels are widely patent.

Mediastinum/Nodes: Dilated esophagus which is similar to the prior
exam. No neck base, mediastinal or hilar masses. No enlarged lymph
nodes. Trachea is unremarkable.

Lungs/Pleura: Mild dependent subsegmental atelectasis in the lower
lobes. More confluent type atelectasis is associated with pleural
base calcification along the posteromedial right hemithorax where
there was a previous loculated fluid collection. No current pleural
effusion. There is no evidence of pneumonia or pulmonary edema.

No pneumothorax.

Upper Abdomen: No acute findings.

Musculoskeletal: No acute fracture or acute finding. No bone lesion.
No chest wall mass.

Review of the MIP images confirms the above findings.
IMPRESSION: 1. No evidence of a pulmonary embolism.
2. No acute findings.
3. Mild dependent subsegmental atelectasis in the lung bases.
Postinflammatory pleural thickening and calcification along the
posteromedial right lower hemithorax.
4. Chronic dilation of the esophagus similar to the prior chest CT.
5. Ascending aorta dilated to 4 cm. Recommend annual imaging
followup by CTA or MRA. This recommendation follows 0616
ACCF/AHA/AATS/ACR/ASA/SCA/SOMMERS/MONG/PERKINS/BERDUGO Guidelines for the
Diagnosis and Management of Patients with Thoracic Aortic Disease.
Circulation. 0616; 121: [id number]. Aortic aneurysm NOS (L67XQ-GFI.2)
6. Aortic atherosclerosis.

Aortic Atherosclerosis (L67XQ-CTF.F).

## 2022-11-23 DIAGNOSIS — M1990 Unspecified osteoarthritis, unspecified site: Secondary | ICD-10-CM | POA: Diagnosis not present

## 2022-11-23 DIAGNOSIS — R091 Pleurisy: Secondary | ICD-10-CM | POA: Diagnosis not present

## 2022-11-23 DIAGNOSIS — Z5181 Encounter for therapeutic drug level monitoring: Secondary | ICD-10-CM | POA: Diagnosis not present

## 2022-11-23 DIAGNOSIS — E038 Other specified hypothyroidism: Secondary | ICD-10-CM | POA: Diagnosis not present

## 2022-11-23 DIAGNOSIS — I4891 Unspecified atrial fibrillation: Secondary | ICD-10-CM | POA: Diagnosis not present

## 2022-11-23 DIAGNOSIS — I058 Other rheumatic mitral valve diseases: Secondary | ICD-10-CM | POA: Diagnosis not present

## 2022-11-23 DIAGNOSIS — E46 Unspecified protein-calorie malnutrition: Secondary | ICD-10-CM | POA: Diagnosis not present

## 2022-11-23 DIAGNOSIS — J189 Pneumonia, unspecified organism: Secondary | ICD-10-CM | POA: Diagnosis not present

## 2022-11-23 DIAGNOSIS — E43 Unspecified severe protein-calorie malnutrition: Secondary | ICD-10-CM | POA: Diagnosis not present

## 2022-11-23 DIAGNOSIS — R000 Tachycardia, unspecified: Secondary | ICD-10-CM | POA: Diagnosis not present

## 2022-11-23 DIAGNOSIS — D518 Other vitamin B12 deficiency anemias: Secondary | ICD-10-CM | POA: Diagnosis not present

## 2022-11-23 DIAGNOSIS — E559 Vitamin D deficiency, unspecified: Secondary | ICD-10-CM | POA: Diagnosis not present

## 2022-11-23 DIAGNOSIS — R531 Weakness: Secondary | ICD-10-CM | POA: Diagnosis not present

## 2022-11-23 DIAGNOSIS — F32A Depression, unspecified: Secondary | ICD-10-CM | POA: Diagnosis not present

## 2022-11-23 DIAGNOSIS — K219 Gastro-esophageal reflux disease without esophagitis: Secondary | ICD-10-CM | POA: Diagnosis not present

## 2022-11-23 DIAGNOSIS — M150 Primary generalized (osteo)arthritis: Secondary | ICD-10-CM | POA: Diagnosis not present

## 2022-11-23 DIAGNOSIS — I110 Hypertensive heart disease with heart failure: Secondary | ICD-10-CM | POA: Diagnosis not present

## 2022-11-23 DIAGNOSIS — R1310 Dysphagia, unspecified: Secondary | ICD-10-CM | POA: Diagnosis not present

## 2022-11-23 DIAGNOSIS — I700 Atherosclerosis of aorta: Secondary | ICD-10-CM | POA: Diagnosis not present

## 2022-11-23 DIAGNOSIS — E782 Mixed hyperlipidemia: Secondary | ICD-10-CM | POA: Diagnosis not present

## 2022-11-23 DIAGNOSIS — I10 Essential (primary) hypertension: Secondary | ICD-10-CM | POA: Diagnosis not present

## 2022-11-23 DIAGNOSIS — R059 Cough, unspecified: Secondary | ICD-10-CM | POA: Diagnosis not present

## 2022-11-23 DIAGNOSIS — I482 Chronic atrial fibrillation, unspecified: Secondary | ICD-10-CM | POA: Diagnosis not present

## 2022-11-23 DIAGNOSIS — R630 Anorexia: Secondary | ICD-10-CM | POA: Diagnosis not present

## 2022-11-23 DIAGNOSIS — J690 Pneumonitis due to inhalation of food and vomit: Secondary | ICD-10-CM | POA: Diagnosis not present

## 2022-11-23 DIAGNOSIS — I509 Heart failure, unspecified: Secondary | ICD-10-CM | POA: Diagnosis not present

## 2022-11-25 DIAGNOSIS — F419 Anxiety disorder, unspecified: Secondary | ICD-10-CM | POA: Diagnosis not present

## 2022-11-26 ENCOUNTER — Telehealth: Payer: Self-pay | Admitting: Gastroenterology

## 2022-11-26 DIAGNOSIS — R911 Solitary pulmonary nodule: Secondary | ICD-10-CM | POA: Diagnosis not present

## 2022-11-26 DIAGNOSIS — Z1231 Encounter for screening mammogram for malignant neoplasm of breast: Secondary | ICD-10-CM | POA: Diagnosis not present

## 2022-11-26 DIAGNOSIS — J9811 Atelectasis: Secondary | ICD-10-CM | POA: Diagnosis not present

## 2022-11-26 DIAGNOSIS — J690 Pneumonitis due to inhalation of food and vomit: Secondary | ICD-10-CM | POA: Diagnosis not present

## 2022-11-26 NOTE — Telephone Encounter (Signed)
Chart reviewed Very complex patient S/P esophagectomy, with gastric pull-through She recently had EGD changed by Dr. Adah Perl 04/2021 at [facility] She is under good care and is established with them. Please continue care with [facility].  RG

## 2022-11-26 NOTE — Telephone Encounter (Signed)
Good Afternoon Dr Lyndel Safe   We have received a referral for patient to be seen for dysphasia.  Patient was previously with [facility] and is requesting transfer due to her primary care advising her to establish care with you.  Records are available for review in epic. Please review and advise on scheduling.   Than you

## 2022-11-27 DIAGNOSIS — I058 Other rheumatic mitral valve diseases: Secondary | ICD-10-CM | POA: Diagnosis not present

## 2022-11-27 DIAGNOSIS — I4891 Unspecified atrial fibrillation: Secondary | ICD-10-CM | POA: Diagnosis not present

## 2022-11-27 DIAGNOSIS — I509 Heart failure, unspecified: Secondary | ICD-10-CM | POA: Diagnosis not present

## 2022-11-27 DIAGNOSIS — I110 Hypertensive heart disease with heart failure: Secondary | ICD-10-CM | POA: Diagnosis not present

## 2022-11-27 DIAGNOSIS — R1310 Dysphagia, unspecified: Secondary | ICD-10-CM | POA: Diagnosis not present

## 2022-11-27 DIAGNOSIS — M1990 Unspecified osteoarthritis, unspecified site: Secondary | ICD-10-CM | POA: Diagnosis not present

## 2022-11-27 DIAGNOSIS — I700 Atherosclerosis of aorta: Secondary | ICD-10-CM | POA: Diagnosis not present

## 2022-11-27 DIAGNOSIS — J690 Pneumonitis due to inhalation of food and vomit: Secondary | ICD-10-CM | POA: Diagnosis not present

## 2022-11-27 DIAGNOSIS — F32A Depression, unspecified: Secondary | ICD-10-CM | POA: Diagnosis not present

## 2022-11-30 DIAGNOSIS — E782 Mixed hyperlipidemia: Secondary | ICD-10-CM | POA: Diagnosis not present

## 2022-11-30 DIAGNOSIS — K219 Gastro-esophageal reflux disease without esophagitis: Secondary | ICD-10-CM | POA: Diagnosis not present

## 2022-11-30 DIAGNOSIS — Z5181 Encounter for therapeutic drug level monitoring: Secondary | ICD-10-CM | POA: Diagnosis not present

## 2022-11-30 DIAGNOSIS — I10 Essential (primary) hypertension: Secondary | ICD-10-CM | POA: Diagnosis not present

## 2022-11-30 DIAGNOSIS — E038 Other specified hypothyroidism: Secondary | ICD-10-CM | POA: Diagnosis not present

## 2022-11-30 DIAGNOSIS — R110 Nausea: Secondary | ICD-10-CM | POA: Diagnosis not present

## 2022-11-30 DIAGNOSIS — H814 Vertigo of central origin: Secondary | ICD-10-CM | POA: Diagnosis not present

## 2022-11-30 DIAGNOSIS — F330 Major depressive disorder, recurrent, mild: Secondary | ICD-10-CM | POA: Diagnosis not present

## 2022-11-30 DIAGNOSIS — D518 Other vitamin B12 deficiency anemias: Secondary | ICD-10-CM | POA: Diagnosis not present

## 2022-12-01 NOTE — Telephone Encounter (Signed)
Patient advised.

## 2022-12-02 ENCOUNTER — Emergency Department (HOSPITAL_COMMUNITY)
Admission: EM | Admit: 2022-12-02 | Discharge: 2022-12-02 | Disposition: A | Discharge status: Home / Self Care | Payer: Medicare HMO | Attending: Emergency Medicine | Admitting: Emergency Medicine

## 2022-12-02 ENCOUNTER — Encounter (HOSPITAL_COMMUNITY): Payer: Self-pay

## 2022-12-02 ENCOUNTER — Emergency Department (HOSPITAL_COMMUNITY): Payer: Medicare HMO

## 2022-12-02 DIAGNOSIS — Z96642 Presence of left artificial hip joint: Secondary | ICD-10-CM | POA: Diagnosis not present

## 2022-12-02 DIAGNOSIS — I10 Essential (primary) hypertension: Secondary | ICD-10-CM | POA: Diagnosis not present

## 2022-12-02 DIAGNOSIS — G8929 Other chronic pain: Secondary | ICD-10-CM | POA: Insufficient documentation

## 2022-12-02 DIAGNOSIS — E119 Type 2 diabetes mellitus without complications: Secondary | ICD-10-CM | POA: Diagnosis not present

## 2022-12-02 DIAGNOSIS — Z7982 Long term (current) use of aspirin: Secondary | ICD-10-CM | POA: Insufficient documentation

## 2022-12-02 DIAGNOSIS — M25552 Pain in left hip: Secondary | ICD-10-CM | POA: Insufficient documentation

## 2022-12-02 DIAGNOSIS — M25559 Pain in unspecified hip: Secondary | ICD-10-CM | POA: Diagnosis not present

## 2022-12-02 DIAGNOSIS — Z79899 Other long term (current) drug therapy: Secondary | ICD-10-CM | POA: Diagnosis not present

## 2022-12-02 DIAGNOSIS — T07XXXA Unspecified multiple injuries, initial encounter: Secondary | ICD-10-CM | POA: Diagnosis not present

## 2022-12-02 NOTE — Discharge Instructions (Signed)
The x-ray today is normal.  All the hardware in the hip is normal and no fractures but you should see the orthopeadist because maybe your replacement is wearing out or the muscles have gotten weak or loose.

## 2022-12-02 NOTE — ED Notes (Signed)
Denies any pain at this time, states she took percocet at 10am PTA

## 2022-12-02 NOTE — ED Provider Notes (Signed)
[place] EMERGENCY DEPARTMENT AT [facility] Provider Note   CSN: [id number] Arrival date & time: 12/02/22  1202     History  Chief Complaint  Patient presents with   Hip Pain    Kristy Archer is a 82 y.o. female.  Patient is an 82 year old female with a history of chronic atrial fibrillation, hypertension, diabetes, chronic pain who is presenting today with complaint of left hip pain.  She says 2 days ago when she was in bed she felt that it clicked and she has had a prior hip replacement and it seemed to be okay but then this morning and buttock looked again and she was having pain.  She denies any falls, numbness or weakness in her leg.  She took a pain pill at home reports the pain is feeling better.  The hip replacement was 20 years ago.  She denies any abdominal pain or new back pain at this time.  The history is provided by the patient.  Hip Pain       Home Medications Prior to Admission medications   Medication Sig Start Date End Date Taking? Authorizing Provider  amLODipine (NORVASC) 2.5 MG tablet Take 2.5 mg by mouth daily. 09/28/22   [provider]  aspirin EC 81 MG tablet Take 81 mg by mouth daily.    [provider]  cephALEXin (KEFLEX) 250 MG capsule Take 250 mg by mouth daily. 10/19/22   [provider]  cyanocobalamin 1000 MCG tablet Take 1,000 mcg by mouth daily.    [provider]  famotidine (PEPCID) 40 MG tablet Take 40 mg by mouth at bedtime. 10/27/21   [provider]  levothyroxine (SYNTHROID) 88 MCG tablet Take 88 mcg by mouth every morning. 11/05/21   [provider]  meclizine (ANTIVERT) 25 MG tablet Take 25 mg by mouth 3 (three) times daily as needed for dizziness.    [provider]  omeprazole (PRILOSEC) 40 MG capsule Take 40 mg by mouth 2 (two) times daily. 11/02/21   [provider]  ondansetron (ZOFRAN) 8 MG tablet Take 8 mg by mouth 2 (two) times daily as needed for  nausea/vomiting. 11/26/21   [provider]  oxyCODONE-acetaminophen (PERCOCET) 10-325 MG tablet Take 1 tablet by mouth 4 (four) times daily as needed for pain. 11/21/21   [provider]  sertraline (ZOLOFT) 100 MG tablet Take 100 mg by mouth daily. 10/16/21   [provider]  simvastatin (ZOCOR) 20 MG tablet Take 20 mg by mouth every evening. 10/18/21   [provider]  sotalol (BETAPACE) 80 MG tablet Take 80 mg by mouth every 12 (twelve) hours. 11/01/21   [provider]  sucralfate (CARAFATE) 1 g tablet Take 1 g by mouth 4 (four) times daily. 11/13/21   [provider]  trimethoprim-polymyxin b (POLYTRIM) ophthalmic solution Place 1 drop into both eyes every 4 (four) hours. 03/27/22   Elwin Mocha, MD      Allergies    Codeine, Linezolid, and Sulfa antibiotics    Review of Systems   Review of Systems  Physical Exam Updated Vital Signs BP (!) 179/67 (BP Location: Left Arm)   Pulse (!) 56   Temp 97.7 F (36.5 C) (Oral)   Resp 18   SpO2 99%  Physical Exam Vitals and nursing note reviewed.  Constitutional:      General: She is not in acute distress.    Appearance: She is normal weight.  HENT:  Head: Normocephalic and atraumatic.  Cardiovascular:     Rate and Rhythm: Normal rate.  Pulmonary:     Effort: Pulmonary effort is normal.  Musculoskeletal:        General: No tenderness.     Comments: No tenderness with palpation of the left hip or deformities.  Able to flex and extend passively and actively.  Mild tenderness with external rotation.  2+ DP pulse in the left foot.  Neurological:     Mental Status: She is alert.     Motor: No weakness.     Comments: 5 out of 5 strength in the left lower extremity     ED Results / Procedures / Treatments   Labs (all labs ordered are listed, but only abnormal results are displayed) Labs Reviewed - No data to display  EKG None  Radiology DG Hip Unilat W or Wo Pelvis 2-3 Views  Left  Result Date: 12/02/2022 CLINICAL DATA:  Left hip pain EXAM: DG HIP (WITH OR WITHOUT PELVIS) 2-3V LEFT COMPARISON:  Pelvis CT 04/27/2021 FINDINGS: Diffuse osteopenia. Prior left hip arthroplasty is in normal alignment without evidence of loosening or periprosthetic fracture. Moderate-severe right hip osteoarthritis. Degenerative changes of the lower lumbar spine. Vascular coils overlie the left pelvis. IMPRESSION: No evidence of left hip arthroplasty complication. Normal alignment without evidence of periprosthetic fracture. Electronically Signed   By: Maurine Simmering M.D.   On: 12/02/2022 12:55    Procedures Procedures    Medications Ordered in ED Medications - No data to display  ED Course/ Medical Decision Making/ A&P                             Medical Decision Making Amount and/or Complexity of Data Reviewed Radiology: ordered and independent interpretation performed. Decision-making details documented in ED Course.   Patient presenting today with complaint of left hip pain which is now improved after taking her pain medication.  She is concerned that it came out of socket as she has had a partial replacement and felt clicking.  Patient is neurovascularly intact at this time.  No deformities noted and patient denies any recent falls. 1:37 PM I have independently visualized and interpreted pt's images today.  X-ray today without evidence of hip dislocation or abnormality in the hardware.  Findings were discussed with the patient and now her daughter who was present.  Her daughter also reports seeing an abnormality with the patient's hip almost sounds like it might be subluxing but does not appear that way at this time.  Patient was encouraged to follow-up with [facility] as she had already planned.  She has pain medication she can take at home.  She and her daughter were comfortable with this plan.         Final Clinical Impression(s) / ED Diagnoses Final diagnoses:   Left hip pain    Rx / DC Orders ED Discharge Orders     None         Blanchie Dessert, MD 12/02/22 1337

## 2022-12-02 NOTE — ED Triage Notes (Signed)
Pt arrived via [facility], c/o left hip pain, states she woke and believed it was out of place. No injury/trauma to area.

## 2022-12-03 DIAGNOSIS — I700 Atherosclerosis of aorta: Secondary | ICD-10-CM | POA: Diagnosis not present

## 2022-12-03 DIAGNOSIS — F32A Depression, unspecified: Secondary | ICD-10-CM | POA: Diagnosis not present

## 2022-12-03 DIAGNOSIS — J690 Pneumonitis due to inhalation of food and vomit: Secondary | ICD-10-CM | POA: Diagnosis not present

## 2022-12-03 DIAGNOSIS — I058 Other rheumatic mitral valve diseases: Secondary | ICD-10-CM | POA: Diagnosis not present

## 2022-12-03 DIAGNOSIS — I110 Hypertensive heart disease with heart failure: Secondary | ICD-10-CM | POA: Diagnosis not present

## 2022-12-03 DIAGNOSIS — R1310 Dysphagia, unspecified: Secondary | ICD-10-CM | POA: Diagnosis not present

## 2022-12-03 DIAGNOSIS — I4891 Unspecified atrial fibrillation: Secondary | ICD-10-CM | POA: Diagnosis not present

## 2022-12-03 DIAGNOSIS — I509 Heart failure, unspecified: Secondary | ICD-10-CM | POA: Diagnosis not present

## 2022-12-03 DIAGNOSIS — M1990 Unspecified osteoarthritis, unspecified site: Secondary | ICD-10-CM | POA: Diagnosis not present

## 2022-12-04 DIAGNOSIS — J690 Pneumonitis due to inhalation of food and vomit: Secondary | ICD-10-CM | POA: Diagnosis not present

## 2022-12-04 DIAGNOSIS — I509 Heart failure, unspecified: Secondary | ICD-10-CM | POA: Diagnosis not present

## 2022-12-04 DIAGNOSIS — I110 Hypertensive heart disease with heart failure: Secondary | ICD-10-CM | POA: Diagnosis not present

## 2022-12-04 DIAGNOSIS — F32A Depression, unspecified: Secondary | ICD-10-CM | POA: Diagnosis not present

## 2022-12-04 DIAGNOSIS — R1310 Dysphagia, unspecified: Secondary | ICD-10-CM | POA: Diagnosis not present

## 2022-12-04 DIAGNOSIS — I700 Atherosclerosis of aorta: Secondary | ICD-10-CM | POA: Diagnosis not present

## 2022-12-04 DIAGNOSIS — I058 Other rheumatic mitral valve diseases: Secondary | ICD-10-CM | POA: Diagnosis not present

## 2022-12-04 DIAGNOSIS — M1990 Unspecified osteoarthritis, unspecified site: Secondary | ICD-10-CM | POA: Diagnosis not present

## 2022-12-04 DIAGNOSIS — I4891 Unspecified atrial fibrillation: Secondary | ICD-10-CM | POA: Diagnosis not present

## 2022-12-07 DIAGNOSIS — J690 Pneumonitis due to inhalation of food and vomit: Secondary | ICD-10-CM | POA: Diagnosis not present

## 2022-12-07 DIAGNOSIS — I058 Other rheumatic mitral valve diseases: Secondary | ICD-10-CM | POA: Diagnosis not present

## 2022-12-07 DIAGNOSIS — M1990 Unspecified osteoarthritis, unspecified site: Secondary | ICD-10-CM | POA: Diagnosis not present

## 2022-12-07 DIAGNOSIS — I4891 Unspecified atrial fibrillation: Secondary | ICD-10-CM | POA: Diagnosis not present

## 2022-12-07 DIAGNOSIS — R1310 Dysphagia, unspecified: Secondary | ICD-10-CM | POA: Diagnosis not present

## 2022-12-07 DIAGNOSIS — I110 Hypertensive heart disease with heart failure: Secondary | ICD-10-CM | POA: Diagnosis not present

## 2022-12-07 DIAGNOSIS — F32A Depression, unspecified: Secondary | ICD-10-CM | POA: Diagnosis not present

## 2022-12-07 DIAGNOSIS — I509 Heart failure, unspecified: Secondary | ICD-10-CM | POA: Diagnosis not present

## 2022-12-07 DIAGNOSIS — I700 Atherosclerosis of aorta: Secondary | ICD-10-CM | POA: Diagnosis not present

## 2022-12-10 DIAGNOSIS — F32A Depression, unspecified: Secondary | ICD-10-CM | POA: Diagnosis not present

## 2022-12-10 DIAGNOSIS — J690 Pneumonitis due to inhalation of food and vomit: Secondary | ICD-10-CM | POA: Diagnosis not present

## 2022-12-10 DIAGNOSIS — M1990 Unspecified osteoarthritis, unspecified site: Secondary | ICD-10-CM | POA: Diagnosis not present

## 2022-12-10 DIAGNOSIS — R1310 Dysphagia, unspecified: Secondary | ICD-10-CM | POA: Diagnosis not present

## 2022-12-10 DIAGNOSIS — I4891 Unspecified atrial fibrillation: Secondary | ICD-10-CM | POA: Diagnosis not present

## 2022-12-10 DIAGNOSIS — I509 Heart failure, unspecified: Secondary | ICD-10-CM | POA: Diagnosis not present

## 2022-12-10 DIAGNOSIS — I110 Hypertensive heart disease with heart failure: Secondary | ICD-10-CM | POA: Diagnosis not present

## 2022-12-10 DIAGNOSIS — I700 Atherosclerosis of aorta: Secondary | ICD-10-CM | POA: Diagnosis not present

## 2022-12-10 DIAGNOSIS — I058 Other rheumatic mitral valve diseases: Secondary | ICD-10-CM | POA: Diagnosis not present

## 2022-12-14 DIAGNOSIS — J690 Pneumonitis due to inhalation of food and vomit: Secondary | ICD-10-CM | POA: Diagnosis not present

## 2022-12-14 DIAGNOSIS — I4891 Unspecified atrial fibrillation: Secondary | ICD-10-CM | POA: Diagnosis not present

## 2022-12-14 DIAGNOSIS — I110 Hypertensive heart disease with heart failure: Secondary | ICD-10-CM | POA: Diagnosis not present

## 2022-12-14 DIAGNOSIS — M1990 Unspecified osteoarthritis, unspecified site: Secondary | ICD-10-CM | POA: Diagnosis not present

## 2022-12-14 DIAGNOSIS — I509 Heart failure, unspecified: Secondary | ICD-10-CM | POA: Diagnosis not present

## 2022-12-14 DIAGNOSIS — F32A Depression, unspecified: Secondary | ICD-10-CM | POA: Diagnosis not present

## 2022-12-14 DIAGNOSIS — I058 Other rheumatic mitral valve diseases: Secondary | ICD-10-CM | POA: Diagnosis not present

## 2022-12-14 DIAGNOSIS — I700 Atherosclerosis of aorta: Secondary | ICD-10-CM | POA: Diagnosis not present

## 2022-12-14 DIAGNOSIS — R1310 Dysphagia, unspecified: Secondary | ICD-10-CM | POA: Diagnosis not present

## 2022-12-18 DIAGNOSIS — G894 Chronic pain syndrome: Secondary | ICD-10-CM | POA: Diagnosis not present

## 2022-12-18 DIAGNOSIS — D518 Other vitamin B12 deficiency anemias: Secondary | ICD-10-CM | POA: Diagnosis not present

## 2022-12-18 DIAGNOSIS — E782 Mixed hyperlipidemia: Secondary | ICD-10-CM | POA: Diagnosis not present

## 2022-12-18 DIAGNOSIS — E559 Vitamin D deficiency, unspecified: Secondary | ICD-10-CM | POA: Diagnosis not present

## 2022-12-18 DIAGNOSIS — E038 Other specified hypothyroidism: Secondary | ICD-10-CM | POA: Diagnosis not present

## 2022-12-18 DIAGNOSIS — F3342 Major depressive disorder, recurrent, in full remission: Secondary | ICD-10-CM | POA: Diagnosis not present

## 2022-12-18 DIAGNOSIS — M150 Primary generalized (osteo)arthritis: Secondary | ICD-10-CM | POA: Diagnosis not present

## 2022-12-18 DIAGNOSIS — I482 Chronic atrial fibrillation, unspecified: Secondary | ICD-10-CM | POA: Diagnosis not present

## 2022-12-18 DIAGNOSIS — M810 Age-related osteoporosis without current pathological fracture: Secondary | ICD-10-CM | POA: Diagnosis not present

## 2022-12-21 DIAGNOSIS — R1310 Dysphagia, unspecified: Secondary | ICD-10-CM | POA: Diagnosis not present

## 2022-12-21 DIAGNOSIS — F32A Depression, unspecified: Secondary | ICD-10-CM | POA: Diagnosis not present

## 2022-12-21 DIAGNOSIS — I058 Other rheumatic mitral valve diseases: Secondary | ICD-10-CM | POA: Diagnosis not present

## 2022-12-21 DIAGNOSIS — M1990 Unspecified osteoarthritis, unspecified site: Secondary | ICD-10-CM | POA: Diagnosis not present

## 2022-12-21 DIAGNOSIS — I509 Heart failure, unspecified: Secondary | ICD-10-CM | POA: Diagnosis not present

## 2022-12-21 DIAGNOSIS — I110 Hypertensive heart disease with heart failure: Secondary | ICD-10-CM | POA: Diagnosis not present

## 2022-12-21 DIAGNOSIS — I700 Atherosclerosis of aorta: Secondary | ICD-10-CM | POA: Diagnosis not present

## 2022-12-21 DIAGNOSIS — J690 Pneumonitis due to inhalation of food and vomit: Secondary | ICD-10-CM | POA: Diagnosis not present

## 2022-12-21 DIAGNOSIS — I4891 Unspecified atrial fibrillation: Secondary | ICD-10-CM | POA: Diagnosis not present

## 2022-12-23 DIAGNOSIS — J690 Pneumonitis due to inhalation of food and vomit: Secondary | ICD-10-CM | POA: Diagnosis not present

## 2022-12-23 DIAGNOSIS — I110 Hypertensive heart disease with heart failure: Secondary | ICD-10-CM | POA: Diagnosis not present

## 2022-12-23 DIAGNOSIS — M1990 Unspecified osteoarthritis, unspecified site: Secondary | ICD-10-CM | POA: Diagnosis not present

## 2022-12-23 DIAGNOSIS — I4891 Unspecified atrial fibrillation: Secondary | ICD-10-CM | POA: Diagnosis not present

## 2022-12-23 DIAGNOSIS — I509 Heart failure, unspecified: Secondary | ICD-10-CM | POA: Diagnosis not present

## 2022-12-23 DIAGNOSIS — F32A Depression, unspecified: Secondary | ICD-10-CM | POA: Diagnosis not present

## 2022-12-23 DIAGNOSIS — I058 Other rheumatic mitral valve diseases: Secondary | ICD-10-CM | POA: Diagnosis not present

## 2022-12-23 DIAGNOSIS — I700 Atherosclerosis of aorta: Secondary | ICD-10-CM | POA: Diagnosis not present

## 2022-12-23 DIAGNOSIS — R1310 Dysphagia, unspecified: Secondary | ICD-10-CM | POA: Diagnosis not present

## 2022-12-28 DIAGNOSIS — I110 Hypertensive heart disease with heart failure: Secondary | ICD-10-CM | POA: Diagnosis not present

## 2022-12-28 DIAGNOSIS — J690 Pneumonitis due to inhalation of food and vomit: Secondary | ICD-10-CM | POA: Diagnosis not present

## 2022-12-28 DIAGNOSIS — R1310 Dysphagia, unspecified: Secondary | ICD-10-CM | POA: Diagnosis not present

## 2022-12-28 DIAGNOSIS — M1990 Unspecified osteoarthritis, unspecified site: Secondary | ICD-10-CM | POA: Diagnosis not present

## 2022-12-28 DIAGNOSIS — I058 Other rheumatic mitral valve diseases: Secondary | ICD-10-CM | POA: Diagnosis not present

## 2022-12-28 DIAGNOSIS — F32A Depression, unspecified: Secondary | ICD-10-CM | POA: Diagnosis not present

## 2022-12-28 DIAGNOSIS — I700 Atherosclerosis of aorta: Secondary | ICD-10-CM | POA: Diagnosis not present

## 2022-12-28 DIAGNOSIS — I509 Heart failure, unspecified: Secondary | ICD-10-CM | POA: Diagnosis not present

## 2022-12-28 DIAGNOSIS — I4891 Unspecified atrial fibrillation: Secondary | ICD-10-CM | POA: Diagnosis not present

## 2023-01-04 DIAGNOSIS — I4891 Unspecified atrial fibrillation: Secondary | ICD-10-CM | POA: Diagnosis not present

## 2023-01-04 DIAGNOSIS — I509 Heart failure, unspecified: Secondary | ICD-10-CM | POA: Diagnosis not present

## 2023-01-04 DIAGNOSIS — J690 Pneumonitis due to inhalation of food and vomit: Secondary | ICD-10-CM | POA: Diagnosis not present

## 2023-01-04 DIAGNOSIS — R1310 Dysphagia, unspecified: Secondary | ICD-10-CM | POA: Diagnosis not present

## 2023-01-04 DIAGNOSIS — F32A Depression, unspecified: Secondary | ICD-10-CM | POA: Diagnosis not present

## 2023-01-04 DIAGNOSIS — I058 Other rheumatic mitral valve diseases: Secondary | ICD-10-CM | POA: Diagnosis not present

## 2023-01-04 DIAGNOSIS — M1990 Unspecified osteoarthritis, unspecified site: Secondary | ICD-10-CM | POA: Diagnosis not present

## 2023-01-04 DIAGNOSIS — I110 Hypertensive heart disease with heart failure: Secondary | ICD-10-CM | POA: Diagnosis not present

## 2023-01-04 DIAGNOSIS — I700 Atherosclerosis of aorta: Secondary | ICD-10-CM | POA: Diagnosis not present

## 2023-01-05 DIAGNOSIS — R1310 Dysphagia, unspecified: Secondary | ICD-10-CM | POA: Diagnosis not present

## 2023-01-05 DIAGNOSIS — J690 Pneumonitis due to inhalation of food and vomit: Secondary | ICD-10-CM | POA: Diagnosis not present

## 2023-01-05 DIAGNOSIS — M1990 Unspecified osteoarthritis, unspecified site: Secondary | ICD-10-CM | POA: Diagnosis not present

## 2023-01-05 DIAGNOSIS — I110 Hypertensive heart disease with heart failure: Secondary | ICD-10-CM | POA: Diagnosis not present

## 2023-01-05 DIAGNOSIS — I509 Heart failure, unspecified: Secondary | ICD-10-CM | POA: Diagnosis not present

## 2023-01-05 DIAGNOSIS — I700 Atherosclerosis of aorta: Secondary | ICD-10-CM | POA: Diagnosis not present

## 2023-01-05 DIAGNOSIS — I4891 Unspecified atrial fibrillation: Secondary | ICD-10-CM | POA: Diagnosis not present

## 2023-01-05 DIAGNOSIS — I058 Other rheumatic mitral valve diseases: Secondary | ICD-10-CM | POA: Diagnosis not present

## 2023-01-05 DIAGNOSIS — F32A Depression, unspecified: Secondary | ICD-10-CM | POA: Diagnosis not present

## 2023-01-05 DIAGNOSIS — Z87440 Personal history of urinary (tract) infections: Secondary | ICD-10-CM | POA: Diagnosis not present

## 2023-01-11 DIAGNOSIS — N390 Urinary tract infection, site not specified: Secondary | ICD-10-CM | POA: Diagnosis not present

## 2023-01-11 DIAGNOSIS — N3281 Overactive bladder: Secondary | ICD-10-CM | POA: Diagnosis not present

## 2023-01-11 DIAGNOSIS — N952 Postmenopausal atrophic vaginitis: Secondary | ICD-10-CM | POA: Diagnosis not present

## 2023-01-19 DIAGNOSIS — E559 Vitamin D deficiency, unspecified: Secondary | ICD-10-CM | POA: Diagnosis not present

## 2023-01-19 DIAGNOSIS — D518 Other vitamin B12 deficiency anemias: Secondary | ICD-10-CM | POA: Diagnosis not present

## 2023-01-19 DIAGNOSIS — M810 Age-related osteoporosis without current pathological fracture: Secondary | ICD-10-CM | POA: Diagnosis not present

## 2023-01-19 DIAGNOSIS — G894 Chronic pain syndrome: Secondary | ICD-10-CM | POA: Diagnosis not present

## 2023-01-19 DIAGNOSIS — E038 Other specified hypothyroidism: Secondary | ICD-10-CM | POA: Diagnosis not present

## 2023-01-19 DIAGNOSIS — F3342 Major depressive disorder, recurrent, in full remission: Secondary | ICD-10-CM | POA: Diagnosis not present

## 2023-01-19 DIAGNOSIS — I482 Chronic atrial fibrillation, unspecified: Secondary | ICD-10-CM | POA: Diagnosis not present

## 2023-01-19 DIAGNOSIS — M150 Primary generalized (osteo)arthritis: Secondary | ICD-10-CM | POA: Diagnosis not present

## 2023-01-19 DIAGNOSIS — E782 Mixed hyperlipidemia: Secondary | ICD-10-CM | POA: Diagnosis not present

## 2023-01-20 DIAGNOSIS — I058 Other rheumatic mitral valve diseases: Secondary | ICD-10-CM | POA: Diagnosis not present

## 2023-01-20 DIAGNOSIS — F32A Depression, unspecified: Secondary | ICD-10-CM | POA: Diagnosis not present

## 2023-01-20 DIAGNOSIS — E038 Other specified hypothyroidism: Secondary | ICD-10-CM | POA: Diagnosis not present

## 2023-01-20 DIAGNOSIS — I482 Chronic atrial fibrillation, unspecified: Secondary | ICD-10-CM | POA: Diagnosis not present

## 2023-01-20 DIAGNOSIS — M150 Primary generalized (osteo)arthritis: Secondary | ICD-10-CM | POA: Diagnosis not present

## 2023-01-20 DIAGNOSIS — M1990 Unspecified osteoarthritis, unspecified site: Secondary | ICD-10-CM | POA: Diagnosis not present

## 2023-01-20 DIAGNOSIS — G894 Chronic pain syndrome: Secondary | ICD-10-CM | POA: Diagnosis not present

## 2023-01-20 DIAGNOSIS — I4891 Unspecified atrial fibrillation: Secondary | ICD-10-CM | POA: Diagnosis not present

## 2023-01-20 DIAGNOSIS — I509 Heart failure, unspecified: Secondary | ICD-10-CM | POA: Diagnosis not present

## 2023-01-20 DIAGNOSIS — E559 Vitamin D deficiency, unspecified: Secondary | ICD-10-CM | POA: Diagnosis not present

## 2023-01-20 DIAGNOSIS — F3342 Major depressive disorder, recurrent, in full remission: Secondary | ICD-10-CM | POA: Diagnosis not present

## 2023-01-20 DIAGNOSIS — J690 Pneumonitis due to inhalation of food and vomit: Secondary | ICD-10-CM | POA: Diagnosis not present

## 2023-01-20 DIAGNOSIS — M810 Age-related osteoporosis without current pathological fracture: Secondary | ICD-10-CM | POA: Diagnosis not present

## 2023-01-20 DIAGNOSIS — I110 Hypertensive heart disease with heart failure: Secondary | ICD-10-CM | POA: Diagnosis not present

## 2023-01-20 DIAGNOSIS — R1310 Dysphagia, unspecified: Secondary | ICD-10-CM | POA: Diagnosis not present

## 2023-01-20 DIAGNOSIS — D518 Other vitamin B12 deficiency anemias: Secondary | ICD-10-CM | POA: Diagnosis not present

## 2023-01-20 DIAGNOSIS — I700 Atherosclerosis of aorta: Secondary | ICD-10-CM | POA: Diagnosis not present

## 2023-01-20 DIAGNOSIS — E782 Mixed hyperlipidemia: Secondary | ICD-10-CM | POA: Diagnosis not present

## 2023-01-21 DIAGNOSIS — I110 Hypertensive heart disease with heart failure: Secondary | ICD-10-CM | POA: Diagnosis not present

## 2023-01-21 DIAGNOSIS — I509 Heart failure, unspecified: Secondary | ICD-10-CM | POA: Diagnosis not present

## 2023-01-21 DIAGNOSIS — R1310 Dysphagia, unspecified: Secondary | ICD-10-CM | POA: Diagnosis not present

## 2023-01-21 DIAGNOSIS — M1990 Unspecified osteoarthritis, unspecified site: Secondary | ICD-10-CM | POA: Diagnosis not present

## 2023-01-21 DIAGNOSIS — I700 Atherosclerosis of aorta: Secondary | ICD-10-CM | POA: Diagnosis not present

## 2023-01-21 DIAGNOSIS — I058 Other rheumatic mitral valve diseases: Secondary | ICD-10-CM | POA: Diagnosis not present

## 2023-01-21 DIAGNOSIS — I4891 Unspecified atrial fibrillation: Secondary | ICD-10-CM | POA: Diagnosis not present

## 2023-01-21 DIAGNOSIS — F32A Depression, unspecified: Secondary | ICD-10-CM | POA: Diagnosis not present

## 2023-01-21 DIAGNOSIS — J690 Pneumonitis due to inhalation of food and vomit: Secondary | ICD-10-CM | POA: Diagnosis not present

## 2023-01-26 DIAGNOSIS — J690 Pneumonitis due to inhalation of food and vomit: Secondary | ICD-10-CM | POA: Diagnosis not present

## 2023-01-26 DIAGNOSIS — I509 Heart failure, unspecified: Secondary | ICD-10-CM | POA: Diagnosis not present

## 2023-01-26 DIAGNOSIS — M1990 Unspecified osteoarthritis, unspecified site: Secondary | ICD-10-CM | POA: Diagnosis not present

## 2023-01-26 DIAGNOSIS — I700 Atherosclerosis of aorta: Secondary | ICD-10-CM | POA: Diagnosis not present

## 2023-01-26 DIAGNOSIS — I110 Hypertensive heart disease with heart failure: Secondary | ICD-10-CM | POA: Diagnosis not present

## 2023-01-26 DIAGNOSIS — I4891 Unspecified atrial fibrillation: Secondary | ICD-10-CM | POA: Diagnosis not present

## 2023-01-26 DIAGNOSIS — R1310 Dysphagia, unspecified: Secondary | ICD-10-CM | POA: Diagnosis not present

## 2023-01-26 DIAGNOSIS — N390 Urinary tract infection, site not specified: Secondary | ICD-10-CM | POA: Diagnosis not present

## 2023-01-26 DIAGNOSIS — I058 Other rheumatic mitral valve diseases: Secondary | ICD-10-CM | POA: Diagnosis not present

## 2023-01-29 DIAGNOSIS — J690 Pneumonitis due to inhalation of food and vomit: Secondary | ICD-10-CM | POA: Diagnosis not present

## 2023-01-29 DIAGNOSIS — M1990 Unspecified osteoarthritis, unspecified site: Secondary | ICD-10-CM | POA: Diagnosis not present

## 2023-01-29 DIAGNOSIS — I509 Heart failure, unspecified: Secondary | ICD-10-CM | POA: Diagnosis not present

## 2023-01-29 DIAGNOSIS — I058 Other rheumatic mitral valve diseases: Secondary | ICD-10-CM | POA: Diagnosis not present

## 2023-01-29 DIAGNOSIS — I110 Hypertensive heart disease with heart failure: Secondary | ICD-10-CM | POA: Diagnosis not present

## 2023-01-29 DIAGNOSIS — I700 Atherosclerosis of aorta: Secondary | ICD-10-CM | POA: Diagnosis not present

## 2023-01-29 DIAGNOSIS — I4891 Unspecified atrial fibrillation: Secondary | ICD-10-CM | POA: Diagnosis not present

## 2023-01-29 DIAGNOSIS — R1310 Dysphagia, unspecified: Secondary | ICD-10-CM | POA: Diagnosis not present

## 2023-01-29 DIAGNOSIS — N390 Urinary tract infection, site not specified: Secondary | ICD-10-CM | POA: Diagnosis not present

## 2023-02-10 DIAGNOSIS — F419 Anxiety disorder, unspecified: Secondary | ICD-10-CM | POA: Diagnosis not present

## 2023-02-12 DIAGNOSIS — I110 Hypertensive heart disease with heart failure: Secondary | ICD-10-CM | POA: Diagnosis not present

## 2023-02-12 DIAGNOSIS — J690 Pneumonitis due to inhalation of food and vomit: Secondary | ICD-10-CM | POA: Diagnosis not present

## 2023-02-12 DIAGNOSIS — I700 Atherosclerosis of aorta: Secondary | ICD-10-CM | POA: Diagnosis not present

## 2023-02-12 DIAGNOSIS — R1310 Dysphagia, unspecified: Secondary | ICD-10-CM | POA: Diagnosis not present

## 2023-02-12 DIAGNOSIS — N390 Urinary tract infection, site not specified: Secondary | ICD-10-CM | POA: Diagnosis not present

## 2023-02-12 DIAGNOSIS — I058 Other rheumatic mitral valve diseases: Secondary | ICD-10-CM | POA: Diagnosis not present

## 2023-02-12 DIAGNOSIS — M1990 Unspecified osteoarthritis, unspecified site: Secondary | ICD-10-CM | POA: Diagnosis not present

## 2023-02-12 DIAGNOSIS — I509 Heart failure, unspecified: Secondary | ICD-10-CM | POA: Diagnosis not present

## 2023-02-12 DIAGNOSIS — I4891 Unspecified atrial fibrillation: Secondary | ICD-10-CM | POA: Diagnosis not present

## 2023-02-16 DIAGNOSIS — M1990 Unspecified osteoarthritis, unspecified site: Secondary | ICD-10-CM | POA: Diagnosis not present

## 2023-02-16 DIAGNOSIS — I700 Atherosclerosis of aorta: Secondary | ICD-10-CM | POA: Diagnosis not present

## 2023-02-16 DIAGNOSIS — I509 Heart failure, unspecified: Secondary | ICD-10-CM | POA: Diagnosis not present

## 2023-02-16 DIAGNOSIS — I4891 Unspecified atrial fibrillation: Secondary | ICD-10-CM | POA: Diagnosis not present

## 2023-02-16 DIAGNOSIS — R1310 Dysphagia, unspecified: Secondary | ICD-10-CM | POA: Diagnosis not present

## 2023-02-16 DIAGNOSIS — I110 Hypertensive heart disease with heart failure: Secondary | ICD-10-CM | POA: Diagnosis not present

## 2023-02-16 DIAGNOSIS — I058 Other rheumatic mitral valve diseases: Secondary | ICD-10-CM | POA: Diagnosis not present

## 2023-02-16 DIAGNOSIS — J690 Pneumonitis due to inhalation of food and vomit: Secondary | ICD-10-CM | POA: Diagnosis not present

## 2023-02-16 DIAGNOSIS — N390 Urinary tract infection, site not specified: Secondary | ICD-10-CM | POA: Diagnosis not present

## 2023-02-25 DIAGNOSIS — M1990 Unspecified osteoarthritis, unspecified site: Secondary | ICD-10-CM | POA: Diagnosis not present

## 2023-02-25 DIAGNOSIS — I509 Heart failure, unspecified: Secondary | ICD-10-CM | POA: Diagnosis not present

## 2023-02-25 DIAGNOSIS — J690 Pneumonitis due to inhalation of food and vomit: Secondary | ICD-10-CM | POA: Diagnosis not present

## 2023-02-25 DIAGNOSIS — N390 Urinary tract infection, site not specified: Secondary | ICD-10-CM | POA: Diagnosis not present

## 2023-02-25 DIAGNOSIS — I110 Hypertensive heart disease with heart failure: Secondary | ICD-10-CM | POA: Diagnosis not present

## 2023-02-25 DIAGNOSIS — R1310 Dysphagia, unspecified: Secondary | ICD-10-CM | POA: Diagnosis not present

## 2023-02-25 DIAGNOSIS — I058 Other rheumatic mitral valve diseases: Secondary | ICD-10-CM | POA: Diagnosis not present

## 2023-02-25 DIAGNOSIS — I700 Atherosclerosis of aorta: Secondary | ICD-10-CM | POA: Diagnosis not present

## 2023-02-25 DIAGNOSIS — I4891 Unspecified atrial fibrillation: Secondary | ICD-10-CM | POA: Diagnosis not present

## 2023-03-01 DIAGNOSIS — J690 Pneumonitis due to inhalation of food and vomit: Secondary | ICD-10-CM | POA: Diagnosis not present

## 2023-03-01 DIAGNOSIS — I509 Heart failure, unspecified: Secondary | ICD-10-CM | POA: Diagnosis not present

## 2023-03-01 DIAGNOSIS — M1990 Unspecified osteoarthritis, unspecified site: Secondary | ICD-10-CM | POA: Diagnosis not present

## 2023-03-01 DIAGNOSIS — I110 Hypertensive heart disease with heart failure: Secondary | ICD-10-CM | POA: Diagnosis not present

## 2023-03-01 DIAGNOSIS — I4891 Unspecified atrial fibrillation: Secondary | ICD-10-CM | POA: Diagnosis not present

## 2023-03-01 DIAGNOSIS — R1310 Dysphagia, unspecified: Secondary | ICD-10-CM | POA: Diagnosis not present

## 2023-03-01 DIAGNOSIS — I058 Other rheumatic mitral valve diseases: Secondary | ICD-10-CM | POA: Diagnosis not present

## 2023-03-01 DIAGNOSIS — I700 Atherosclerosis of aorta: Secondary | ICD-10-CM | POA: Diagnosis not present

## 2023-03-01 DIAGNOSIS — N390 Urinary tract infection, site not specified: Secondary | ICD-10-CM | POA: Diagnosis not present

## 2023-03-02 DIAGNOSIS — I700 Atherosclerosis of aorta: Secondary | ICD-10-CM | POA: Diagnosis not present

## 2023-03-02 DIAGNOSIS — R1310 Dysphagia, unspecified: Secondary | ICD-10-CM | POA: Diagnosis not present

## 2023-03-02 DIAGNOSIS — M1990 Unspecified osteoarthritis, unspecified site: Secondary | ICD-10-CM | POA: Diagnosis not present

## 2023-03-02 DIAGNOSIS — I058 Other rheumatic mitral valve diseases: Secondary | ICD-10-CM | POA: Diagnosis not present

## 2023-03-02 DIAGNOSIS — N390 Urinary tract infection, site not specified: Secondary | ICD-10-CM | POA: Diagnosis not present

## 2023-03-02 DIAGNOSIS — J690 Pneumonitis due to inhalation of food and vomit: Secondary | ICD-10-CM | POA: Diagnosis not present

## 2023-03-02 DIAGNOSIS — I4891 Unspecified atrial fibrillation: Secondary | ICD-10-CM | POA: Diagnosis not present

## 2023-03-02 DIAGNOSIS — I110 Hypertensive heart disease with heart failure: Secondary | ICD-10-CM | POA: Diagnosis not present

## 2023-03-02 DIAGNOSIS — I509 Heart failure, unspecified: Secondary | ICD-10-CM | POA: Diagnosis not present

## 2023-03-15 DIAGNOSIS — I509 Heart failure, unspecified: Secondary | ICD-10-CM | POA: Diagnosis not present

## 2023-03-15 DIAGNOSIS — N390 Urinary tract infection, site not specified: Secondary | ICD-10-CM | POA: Diagnosis not present

## 2023-03-15 DIAGNOSIS — M1990 Unspecified osteoarthritis, unspecified site: Secondary | ICD-10-CM | POA: Diagnosis not present

## 2023-03-15 DIAGNOSIS — R1310 Dysphagia, unspecified: Secondary | ICD-10-CM | POA: Diagnosis not present

## 2023-03-15 DIAGNOSIS — I058 Other rheumatic mitral valve diseases: Secondary | ICD-10-CM | POA: Diagnosis not present

## 2023-03-15 DIAGNOSIS — I700 Atherosclerosis of aorta: Secondary | ICD-10-CM | POA: Diagnosis not present

## 2023-03-15 DIAGNOSIS — I4891 Unspecified atrial fibrillation: Secondary | ICD-10-CM | POA: Diagnosis not present

## 2023-03-15 DIAGNOSIS — J690 Pneumonitis due to inhalation of food and vomit: Secondary | ICD-10-CM | POA: Diagnosis not present

## 2023-03-15 DIAGNOSIS — I110 Hypertensive heart disease with heart failure: Secondary | ICD-10-CM | POA: Diagnosis not present

## 2023-03-18 DIAGNOSIS — I058 Other rheumatic mitral valve diseases: Secondary | ICD-10-CM | POA: Diagnosis not present

## 2023-03-18 DIAGNOSIS — M1990 Unspecified osteoarthritis, unspecified site: Secondary | ICD-10-CM | POA: Diagnosis not present

## 2023-03-18 DIAGNOSIS — R1310 Dysphagia, unspecified: Secondary | ICD-10-CM | POA: Diagnosis not present

## 2023-03-18 DIAGNOSIS — I110 Hypertensive heart disease with heart failure: Secondary | ICD-10-CM | POA: Diagnosis not present

## 2023-03-18 DIAGNOSIS — N390 Urinary tract infection, site not specified: Secondary | ICD-10-CM | POA: Diagnosis not present

## 2023-03-18 DIAGNOSIS — I700 Atherosclerosis of aorta: Secondary | ICD-10-CM | POA: Diagnosis not present

## 2023-03-18 DIAGNOSIS — I4891 Unspecified atrial fibrillation: Secondary | ICD-10-CM | POA: Diagnosis not present

## 2023-03-18 DIAGNOSIS — J690 Pneumonitis due to inhalation of food and vomit: Secondary | ICD-10-CM | POA: Diagnosis not present

## 2023-03-18 DIAGNOSIS — I509 Heart failure, unspecified: Secondary | ICD-10-CM | POA: Diagnosis not present

## 2023-03-19 DIAGNOSIS — I058 Other rheumatic mitral valve diseases: Secondary | ICD-10-CM | POA: Diagnosis not present

## 2023-03-19 DIAGNOSIS — I700 Atherosclerosis of aorta: Secondary | ICD-10-CM | POA: Diagnosis not present

## 2023-03-19 DIAGNOSIS — N390 Urinary tract infection, site not specified: Secondary | ICD-10-CM | POA: Diagnosis not present

## 2023-03-19 DIAGNOSIS — I4891 Unspecified atrial fibrillation: Secondary | ICD-10-CM | POA: Diagnosis not present

## 2023-03-19 DIAGNOSIS — M1990 Unspecified osteoarthritis, unspecified site: Secondary | ICD-10-CM | POA: Diagnosis not present

## 2023-03-19 DIAGNOSIS — I509 Heart failure, unspecified: Secondary | ICD-10-CM | POA: Diagnosis not present

## 2023-03-19 DIAGNOSIS — R1310 Dysphagia, unspecified: Secondary | ICD-10-CM | POA: Diagnosis not present

## 2023-03-19 DIAGNOSIS — I110 Hypertensive heart disease with heart failure: Secondary | ICD-10-CM | POA: Diagnosis not present

## 2023-03-19 DIAGNOSIS — J690 Pneumonitis due to inhalation of food and vomit: Secondary | ICD-10-CM | POA: Diagnosis not present

## 2023-03-21 ENCOUNTER — Other Ambulatory Visit: Payer: Self-pay

## 2023-03-21 ENCOUNTER — Emergency Department (HOSPITAL_COMMUNITY): Payer: Medicare HMO

## 2023-03-21 ENCOUNTER — Emergency Department (HOSPITAL_COMMUNITY)
Admission: EM | Admit: 2023-03-21 | Discharge: 2023-03-21 | Disposition: A | Discharge status: Home / Self Care | Payer: Medicare HMO | Attending: Emergency Medicine | Admitting: Emergency Medicine

## 2023-03-21 DIAGNOSIS — E119 Type 2 diabetes mellitus without complications: Secondary | ICD-10-CM | POA: Diagnosis not present

## 2023-03-21 DIAGNOSIS — D649 Anemia, unspecified: Secondary | ICD-10-CM | POA: Insufficient documentation

## 2023-03-21 DIAGNOSIS — R079 Chest pain, unspecified: Secondary | ICD-10-CM | POA: Diagnosis not present

## 2023-03-21 DIAGNOSIS — R0789 Other chest pain: Secondary | ICD-10-CM

## 2023-03-21 DIAGNOSIS — R0602 Shortness of breath: Secondary | ICD-10-CM | POA: Diagnosis present

## 2023-03-21 DIAGNOSIS — Z7982 Long term (current) use of aspirin: Secondary | ICD-10-CM | POA: Insufficient documentation

## 2023-03-21 DIAGNOSIS — R531 Weakness: Secondary | ICD-10-CM | POA: Insufficient documentation

## 2023-03-21 LAB — CBC
HCT: 30.5 % — ABNORMAL LOW (ref 36.0–46.0)
Hemoglobin: 9.3 g/dL — ABNORMAL LOW (ref 12.0–15.0)
MCH: 25.9 pg — ABNORMAL LOW (ref 26.0–34.0)
MCHC: 30.5 g/dL (ref 30.0–36.0)
MCV: 85 fL (ref 80.0–100.0)
Platelets: 201 10*3/uL (ref 150–400)
RBC: 3.59 MIL/uL — ABNORMAL LOW (ref 3.87–5.11)
RDW: 17.6 % — ABNORMAL HIGH (ref 11.5–15.5)
WBC: 5.8 10*3/uL (ref 4.0–10.5)
nRBC: 0 % (ref 0.0–0.2)

## 2023-03-21 LAB — TROPONIN I (HIGH SENSITIVITY)
Troponin I (High Sensitivity): 13 ng/L (ref <18)
Troponin I (High Sensitivity): 13 ng/L (ref <18)

## 2023-03-21 LAB — BASIC METABOLIC PANEL
Anion gap: 5 (ref 5–15)
BUN: 14 mg/dL (ref 8–23)
CO2: 29 mmol/L (ref 22–32)
Calcium: 8 mg/dL — ABNORMAL LOW (ref 8.9–10.3)
Chloride: 105 mmol/L (ref 98–111)
Creatinine, Ser: 0.55 mg/dL (ref 0.44–1.00)
GFR, Estimated: >60 mL/min (ref >60)
Glucose, Bld: 83 mg/dL (ref 70–99)
Potassium: 4.1 mmol/L (ref 3.5–5.1)
Sodium: 139 mmol/L (ref 135–145)

## 2023-03-21 LAB — POC OCCULT BLOOD, ED: Fecal Occult Bld: NEGATIVE

## 2023-03-21 NOTE — ED Triage Notes (Signed)
Patient BIB EMS coming from home around 0900 started feeling SOB and weakness. X3wks DX with PNA given z-pack that was finish a week ago. C/O chest pressure in route EMS give 324mg  ASA. VSS. CBG 85. PRN home o2, 20gRAC

## 2023-03-21 NOTE — Discharge Instructions (Addendum)
You have been evaluated for your chest pain and shortness of breath.  Fortunately no concerning findings were noted on today's exam.  Your blood count did drop from prior.  Currently her hemoglobin is 9.3.  It was 13.7 a year ago.  Please follow-up closely with your doctor for recheck of your labs.  Return to ER if you have any concern.  Cardiology office will also call to set up an appointment for you to be evaluated further for your chest discomfort.

## 2023-03-21 NOTE — ED Notes (Addendum)
NT transferred pt with pulse ox while ambulating pt O2 was at 99% on room air and pulse was 94

## 2023-03-21 NOTE — ED Provider Notes (Signed)
[place] EMERGENCY DEPARTMENT AT [facility] Provider Note   CSN: [id number] Arrival date & time: 03/21/23  1151     History  Chief Complaint  Patient presents with   Shortness of Breath   Chest Pain   Weakness    Kristy Archer is a 82 y.o. female.  The history is provided by the patient, medical records and the EMS personnel. No language interpreter was used.  Shortness of Breath Associated symptoms: chest pain   Chest Pain Associated symptoms: shortness of breath and weakness   Weakness Associated symptoms: chest pain and shortness of breath      82 year old female significant history of A-fib not on anticoagulation, chronic pain syndrome, obstructive sleep apnea, diabetes brought here via EMS from home for evaluation of shortness of breath.  Patient is hard of hearing, history is difficult to obtain.  Patient report this morning she endorsed some pressure sensation in her chest with associated shortness of breath that is waxing waning but mostly resolved.  She endorsed occasional nonproductive cough which is not unusual.  She denies any fever or chills no nausea vomiting or diarrhea no diaphoresis no pleuritic chest pain.  She did mention having been hospitalized several weeks ago for aspiration pneumonia and was in the hospital for 3 days.  She went home with antibiotic.  She was seen by her PCP for regular checkup 3 days ago.  States she was given some breathing treatment which she felt was helped with her symptoms.  When asked if she has been wheezing, patient states she does not know.  Patient also mention she has trouble with swallowing due to her esophageal stricture which sometimes cause her to aspirate.  Denies any recent aspiration.  Prior to arrival, EMS did give patient 324 mg of aspirin.  She mention she did not notice any significant changes in her symptoms.  Home Medications Prior to Admission medications   Medication Sig Start Date End Date Taking?  Authorizing Provider  amLODipine (NORVASC) 2.5 MG tablet Take 2.5 mg by mouth daily. 09/28/22   [provider]  aspirin EC 81 MG tablet Take 81 mg by mouth daily.    [provider]  cephALEXin (KEFLEX) 250 MG capsule Take 250 mg by mouth daily. 10/19/22   [provider]  cyanocobalamin 1000 MCG tablet Take 1,000 mcg by mouth daily.    [provider]  famotidine (PEPCID) 40 MG tablet Take 40 mg by mouth at bedtime. 10/27/21   [provider]  levothyroxine (SYNTHROID) 88 MCG tablet Take 88 mcg by mouth every morning. 11/05/21   [provider]  meclizine (ANTIVERT) 25 MG tablet Take 25 mg by mouth 3 (three) times daily as needed for dizziness.    [provider]  omeprazole (PRILOSEC) 40 MG capsule Take 40 mg by mouth 2 (two) times daily. 11/02/21   [provider]  ondansetron (ZOFRAN) 8 MG tablet Take 8 mg by mouth 2 (two) times daily as needed for nausea/vomiting. 11/26/21   [provider]  oxyCODONE-acetaminophen (PERCOCET) 10-325 MG tablet Take 1 tablet by mouth 4 (four) times daily as needed for pain. 11/21/21   [provider]  sertraline (ZOLOFT) 100 MG tablet Take 100 mg by mouth daily. 10/16/21   [provider]  simvastatin (ZOCOR) 20 MG tablet Take 20 mg by mouth every evening. 10/18/21   [provider]  sotalol (BETAPACE) 80 MG tablet Take 80 mg by mouth every 12 (twelve) hours. 11/01/21  [provider]  sucralfate (CARAFATE) 1 g tablet Take 1 g by mouth 4 (four) times daily. 11/13/21   [provider]  trimethoprim-polymyxin b (POLYTRIM) ophthalmic solution Place 1 drop into both eyes every 4 (four) hours. 03/27/22   Haydee Salter, MD      Allergies    Codeine, Linezolid, and Sulfa antibiotics    Review of Systems   Review of Systems  Respiratory:  Positive for shortness of breath.   Cardiovascular:  Positive for chest pain.  Neurological:  Positive for  weakness.  All other systems reviewed and are negative.   Physical Exam Updated Vital Signs BP (!) 156/61   Pulse 72   Temp 97.7 F (36.5 C) (Oral)   Resp 18   SpO2 100%  Physical Exam Vitals and nursing note reviewed.  Constitutional:      General: She is not in acute distress.    Appearance: She is well-developed.  HENT:     Head: Atraumatic.  Eyes:     Conjunctiva/sclera: Conjunctivae normal.  Cardiovascular:     Rate and Rhythm: Rhythm irregular.  Pulmonary:     Effort: Pulmonary effort is normal.     Breath sounds: No decreased breath sounds, wheezing, rhonchi or rales.  Chest:     Chest wall: No tenderness.  Musculoskeletal:        General: Normal range of motion.     Cervical back: Neck supple.  Skin:    Findings: No rash.  Neurological:     Mental Status: She is alert.  Psychiatric:        Mood and Affect: Mood normal.     ED Results / Procedures / Treatments   Labs (all labs ordered are listed, but only abnormal results are displayed) Labs Reviewed  BASIC METABOLIC PANEL - Abnormal; Notable for the following components:      Result Value   Calcium 8.0 (*)    All other components within normal limits  CBC - Abnormal; Notable for the following components:   RBC 3.59 (*)    Hemoglobin 9.3 (*)    HCT 30.5 (*)    MCH 25.9 (*)    RDW 17.6 (*)    All other components within normal limits  CBC WITH DIFFERENTIAL/PLATELET  CBC WITH DIFFERENTIAL/PLATELET  TROPONIN I (HIGH SENSITIVITY)  TROPONIN I (HIGH SENSITIVITY)    EKG EKG Interpretation Date/Time:  Sunday March 21 2023 12:02:28 EDT Ventricular Rate:  56 PR Interval:  113 QRS Duration:  86 QT Interval:  434 QTC Calculation: 419 R Axis:   -18  Text Interpretation: Sinus rhythm Atrial premature complexes Borderline short PR interval Confirmed by Alvino Blood ([id number]) on 03/21/2023 12:08:16 PM  Radiology DG Chest 2 View  Result Date: 03/21/2023 CLINICAL DATA:  82 year old female with chest  pain. Shortness of breath and weakness. EXAM: CHEST - 2 VIEW COMPARISON:  Portable chest 03/08/2023 and earlier. FINDINGS: Semi upright AP and lateral views at 1232 hours. Stable lung volumes. Mediastinal contours are stable, borderline to mild cardiomegaly. Visualized tracheal air column is within normal limits. No pneumothorax, pulmonary edema, pleural effusion, or confluent lung opacity. Calcified aortic atherosclerosis. Multilevel thoracic spinal ankylosis. Osteopenia. Stable cholecystectomy clips. Negative visible bowel gas. IMPRESSION: 1. No acute cardiopulmonary abnormality. 2.  Aortic Atherosclerosis (ICD10-I70.0). Thoracic ankylosis. Electronically Signed   By: Odessa Fleming M.D.   On: 03/21/2023 12:53    Procedures Procedures    Medications Ordered in ED Medications - No data to display  ED Course/  Medical Decision Making/ A&P                             Medical Decision Making Amount and/or Complexity of Data Reviewed Labs: ordered. Radiology: ordered.   BP (!) 156/61   Pulse 72   Temp 97.7 F (36.5 C) (Oral)   Resp 18   SpO2 100%   23:59 PM  82 year old female significant history of A-fib not on anticoagulation, chronic pain syndrome, obstructive sleep apnea, diabetes brought here via EMS from home for evaluation of shortness of breath.  Patient is hard of hearing, history is difficult to obtain.  Patient report this morning she endorsed some pressure sensation in her chest with associated shortness of breath that is waxing waning but mostly resolved.  She endorsed occasional nonproductive cough which is not unusual.  She denies any fever or chills no nausea vomiting or diarrhea no diaphoresis no pleuritic chest pain.  She did mention having been hospitalized several weeks ago for aspiration pneumonia and was in the hospital for 3 days.  She went home with antibiotic.  She was seen by her PCP for regular checkup 3 days ago.  States she was given some breathing treatment which she  felt was helped with her symptoms.  When asked if she has been wheezing, patient states she does not know.  Patient also mention she has trouble with swallowing due to her esophageal stricture which sometimes cause her to aspirate.  Denies any recent aspiration.  Prior to arrival, EMS did give patient 324 mg of aspirin.  She mention she did not notice any significant changes in her symptoms.  On exam this is a elderly female laying in bed appears to be in no acute discomfort.  She is hard of hearing.  Heart with irregularly irregular heart rhythm, lungs without any other changes lung sounds no wheezes rales or rhonchi heard abdomen is soft nontender no peripheral edema patient is moving all 4 extremities with equal effort.  -Labs ordered, independently viewed and interpreted by me.  Labs remarkable for negative delta trop, low suspicion for MACE. Hgb 9.3, it was 13.7 a year ago.  I performed a rectal exam, normal color stool, fecal occult blood test negative. -The patient was maintained on a cardiac monitor.  I personally viewed and interpreted the cardiac monitored which showed an underlying rhythm of: NSR -Imaging independently viewed and interpreted by me and I agree with radiologist's interpretation.  Result remarkable for CXR without acute changes -This patient presents to the ED for concern of chest pain, this involves an extensive number of treatment options, and is a complaint that carries with it a high risk of complications and morbidity.  The differential diagnosis includes atypical chest pain, acs, gerd, gastritis, pe, pna, anemia -Co morbidities that complicate the patient evaluation includes afib, chronic pain, gerd, dm -Treatment includes supportive care -Reevaluation of the patient after these medicines showed that the patient improved -PCP office notes or outside notes reviewed -Discussion with specialist attending Dr. Suezanne Jacquet -Escalation to admission/observation considered: patients  feels much better, is comfortable with discharge, and will follow up with PCP -Prescription medication considered, patient comfortable with OTC meds -Social Determinant of Health considered   Workup overall reassuring.  Patient does have a drop in hemoglobin from 13.7 approximately a year ago to now it is 9.3.  She denies any abnormal bleeding, her stool is normal in appearance and she is fecal occult blood test  negative.  Recommend patient to follow-up with her doctor for recheck.  Otherwise she is stable to be discharged home         Final Clinical Impression(s) / ED Diagnoses Final diagnoses:  Atypical chest pain  Anemia, unspecified type    Rx / DC Orders ED Discharge Orders          Ordered    Ambulatory referral to Cardiology       Comments: If you have not heard from the Cardiology office within the next 72 hours please call [phone].   03/21/23 1825              Fayrene Helper, PA-C 03/21/23 1826    Lonell Grandchild, MD 03/22/23 1510

## 2023-03-23 DIAGNOSIS — R1310 Dysphagia, unspecified: Secondary | ICD-10-CM | POA: Diagnosis not present

## 2023-03-23 DIAGNOSIS — I4891 Unspecified atrial fibrillation: Secondary | ICD-10-CM | POA: Diagnosis not present

## 2023-03-23 DIAGNOSIS — I700 Atherosclerosis of aorta: Secondary | ICD-10-CM | POA: Diagnosis not present

## 2023-03-23 DIAGNOSIS — I110 Hypertensive heart disease with heart failure: Secondary | ICD-10-CM | POA: Diagnosis not present

## 2023-03-23 DIAGNOSIS — M1990 Unspecified osteoarthritis, unspecified site: Secondary | ICD-10-CM | POA: Diagnosis not present

## 2023-03-23 DIAGNOSIS — J470 Bronchiectasis with acute lower respiratory infection: Secondary | ICD-10-CM | POA: Diagnosis not present

## 2023-03-23 DIAGNOSIS — I058 Other rheumatic mitral valve diseases: Secondary | ICD-10-CM | POA: Diagnosis not present

## 2023-03-23 DIAGNOSIS — J189 Pneumonia, unspecified organism: Secondary | ICD-10-CM | POA: Diagnosis not present

## 2023-03-23 DIAGNOSIS — I509 Heart failure, unspecified: Secondary | ICD-10-CM | POA: Diagnosis not present

## 2023-03-26 DIAGNOSIS — I4891 Unspecified atrial fibrillation: Secondary | ICD-10-CM | POA: Diagnosis not present

## 2023-03-26 DIAGNOSIS — I509 Heart failure, unspecified: Secondary | ICD-10-CM | POA: Diagnosis not present

## 2023-03-26 DIAGNOSIS — I110 Hypertensive heart disease with heart failure: Secondary | ICD-10-CM | POA: Diagnosis not present

## 2023-03-26 DIAGNOSIS — I058 Other rheumatic mitral valve diseases: Secondary | ICD-10-CM | POA: Diagnosis not present

## 2023-03-26 DIAGNOSIS — J189 Pneumonia, unspecified organism: Secondary | ICD-10-CM | POA: Diagnosis not present

## 2023-03-26 DIAGNOSIS — J470 Bronchiectasis with acute lower respiratory infection: Secondary | ICD-10-CM | POA: Diagnosis not present

## 2023-03-26 DIAGNOSIS — I700 Atherosclerosis of aorta: Secondary | ICD-10-CM | POA: Diagnosis not present

## 2023-03-26 DIAGNOSIS — M1990 Unspecified osteoarthritis, unspecified site: Secondary | ICD-10-CM | POA: Diagnosis not present

## 2023-03-26 DIAGNOSIS — R1310 Dysphagia, unspecified: Secondary | ICD-10-CM | POA: Diagnosis not present

## 2023-03-29 NOTE — Progress Notes (Unsigned)
Cardiology Office Note:  .   Date:  03/30/2023  ID:  ANAVI LYDIA, DOB [birth year], MRN [id number] PCP: Hortencia Conradi, NP  [facility] Providers Cardiologist:  None    History of Present Illness: .   Kristy Archer is a 82 y.o. female with a past medical history of PAF--not on anticoagulation, hypertension, OSA, hypothyroidism, DM 2, hyperlipidemia, coronary artery calcifications noted on imaging.  Evaluated by Dr. Dulce Sellar on 10/29/2022, she was in sinus rhythm, not on anticoagulation secondary to frailty, goal of her blood pressure was 140-100 50s systolic  Evaluated at [facility] emergency department on 03/21/2023 for atypical chest pain and shortness of breath, troponin was negative, chest x-ray clear.  Her hemoglobin was 9.3, FOBT was negative, she was maintaining sinus rhythm.  She presents today accompanied by her daughter for follow-up of her atrial fibrillation, she is in sinus rhythm today.  She offers no formal complaints, does endorse a few episodes where she has not felt well but cannot expound on what exactly caused her to feel bad.  She is very hard of hearing.  She is pale, recent hemoglobin was 9.3.  She denies hematochezia, hematuria, hemoptysis. She denies chest pain, palpitations, dyspnea, pnd, orthopnea, n, v, dizziness, syncope, edema, weight gain, or early satiety.   ROS: Review of Systems  Constitutional: Negative.   HENT: Negative.    Eyes: Negative.   Respiratory: Negative.    Cardiovascular: Negative.   Gastrointestinal: Negative.   Genitourinary: Negative.   Musculoskeletal: Negative.   Skin: Negative.   Neurological: Negative.   Endo/Heme/Allergies: Negative.   Psychiatric/Behavioral: Negative.       Studies Reviewed: Marland Kitchen   EKG Interpretation Date/Time:  Tuesday March 30 2023 11:42:06 EDT Ventricular Rate:  56 PR Interval:  144 QRS Duration:  74 QT Interval:  452 QTC Calculation: 436 R Axis:   -22  Text Interpretation: Sinus bradycardia  Abnormal ECG When compared with ECG of 21-Mar-2023 12:02, PREVIOUS ECG IS PRESENT Confirmed by Wallis Bamberg [phone]) on 03/30/2023 12:17:01 PM    Cardiac Studies & Procedures     STRESS TESTS  MYOCARDIAL PERFUSION IMAGING 12/16/2021  Narrative   The study is normal. The study is low risk.   Left ventricular function is normal. Nuclear stress EF: 72 %. The left ventricular ejection fraction is hyperdynamic (>65%). End diastolic cavity size is normal.   Prior study not available for comparison.   ECHOCARDIOGRAM  ECHOCARDIOGRAM COMPLETE 12/16/2021  Narrative ECHOCARDIOGRAM REPORT    Patient Name:   Kristy Archer Date of Exam: 12/16/2021 Medical Rec #:  [id number]      Height:       62.0 in Accession #:    [id number]     Weight:       117.6 lb Date of Birth:  May 08, 1941      BSA:          1.525 m Patient Age:    81 years       BP:           146/60 mmHg Patient Gender: F              HR:           50 bpm. Exam Location:  [place]  Procedure: 2D Echo, Cardiac Doppler, Color Doppler and Strain Analysis  Indications:    Essential hypertension [I10 (ICD-10-CM)]; Chronic atrial fibrillation, unspecified (HCC) [I48.20 (ICD-10-CM)]  History:        Patient has no prior history  of Echocardiogram examinations. Arrythmias:Atrial Fibrillation; Risk Factors:Hypertension and Dyslipidemia.  Sonographer:    Margreta Journey RDCS Referring Phys: Rito Ehrlich [facility]  IMPRESSIONS   1. Left ventricular ejection fraction, by estimation, is 60 to 65%. The left ventricle has normal function. The left ventricle has no regional wall motion abnormalities. There is mild left ventricular hypertrophy. Left ventricular diastolic parameters are consistent with Grade II diastolic dysfunction (pseudonormalization). 2. The mitral valve is normal in structure. Mild mitral valve regurgitation. No evidence of mitral stenosis. 3. The aortic valve is normal in structure. Aortic valve regurgitation is mild.  Aortic valve sclerosis is present, with no evidence of aortic valve stenosis.  FINDINGS Left Ventricle: Left ventricular ejection fraction, by estimation, is 60 to 65%. The left ventricle has normal function. The left ventricle has no regional wall motion abnormalities. The left ventricular internal cavity size was normal in size. There is mild left ventricular hypertrophy. Left ventricular diastolic parameters are consistent with Grade II diastolic dysfunction (pseudonormalization).  Right Ventricle: The right ventricular size is normal. No increase in right ventricular wall thickness. Right ventricular systolic function is normal.  Left Atrium: Left atrial size was normal in size.  Right Atrium: Right atrial size was normal in size.  Pericardium: There is no evidence of pericardial effusion.  Mitral Valve: The mitral valve is normal in structure. Mild mitral valve regurgitation. No evidence of mitral valve stenosis.  Tricuspid Valve: The tricuspid valve is normal in structure. Tricuspid valve regurgitation is not demonstrated. No evidence of tricuspid stenosis.  Aortic Valve: The aortic valve is normal in structure. Aortic valve regurgitation is mild. Aortic regurgitation PHT measures 655 msec. Aortic valve sclerosis is present, with no evidence of aortic valve stenosis.  Pulmonic Valve: The pulmonic valve was normal in structure. Pulmonic valve regurgitation is not visualized. No evidence of pulmonic stenosis.  Aorta: The aortic root is normal in size and structure.  Venous: The inferior vena cava is normal in size with greater than 50% respiratory variability, suggesting right atrial pressure of 3 mmHg.  IAS/Shunts: No atrial level shunt detected by color flow Doppler.   LEFT VENTRICLE PLAX 2D LVIDd:         4.00 cm   Diastology LVIDs:         2.80 cm   LV e' medial:    5.00 cm/s LV PW:         1.30 cm   LV E/e' medial:  15.7 LV IVS:        1.50 cm   LV e' lateral:   6.64  cm/s LVOT diam:     1.60 cm   LV E/e' lateral: 11.8 LV SV:         69 LV SV Index:   45 LVOT Area:     2.01 cm   RIGHT VENTRICLE RV Basal diam:  3.00 cm RV S prime:     8.81 cm/s TAPSE (M-mode): 1.8 cm  LEFT ATRIUM             Index        RIGHT ATRIUM           Index LA diam:        4.00 cm 2.62 cm/m   RA Area:     12.90 cm LA Vol (A2C):   38.1 ml 24.98 ml/m  RA Volume:   26.40 ml  17.31 ml/m LA Vol (A4C):   37.0 ml 24.25 ml/m LA Biplane Vol: 40.5 ml 26.55 ml/m AORTIC  VALVE LVOT Vmax:   132.00 cm/s LVOT Vmean:  85.700 cm/s LVOT VTI:    0.342 m AI PHT:      655 msec  AORTA Ao Root diam: 3.60 cm Ao Asc diam:  3.90 cm  MITRAL VALVE MV Area (PHT): 4.36 cm    SHUNTS MV Decel Time: 174 msec    Systemic VTI:  0.34 m MV E velocity: 78.40 cm/s  Systemic Diam: 1.60 cm MV A velocity: 61.30 cm/s MV E/A ratio:  1.28  Belva Crome MD Electronically signed by Belva Crome MD Signature Date/Time: 12/16/2021/3:09:14 PM    Final             Risk Assessment/Calculations:    CHA2DS2-VASc Score = 5   This indicates a 7.2% annual risk of stroke. The patient's score is based upon: CHF History: 0 HTN History: 1 Diabetes History: 1 Stroke History: 0 Vascular Disease History: 0 Age Score: 2 Gender Score: 1            Physical Exam:   VS:  BP 124/60 (BP Location: Left Arm, Patient Position: Sitting, Cuff Size: Normal)   Pulse (!) 57   Ht 5\' 2"  (1.575 m)   Wt 101 lb (45.8 kg)   SpO2 94%   BMI 18.47 kg/m    Wt Readings from Last 3 Encounters:  03/30/23 101 lb (45.8 kg)  10/29/22 100 lb 9.6 oz (45.6 kg)  02/25/22 108 lb (49 kg)    GEN: pale, cachectic, no acute distress  NECK: No JVD; No carotid bruits CARDIAC: RRR, no murmurs, rubs, gallops RESPIRATORY:  Clear to auscultation without rales, wheezing or rhonchi  ABDOMEN: Soft, non-tender, non-distended EXTREMITIES:  No edema; No deformity   ASSESSMENT AND PLAN: .   Paroxysmal atrial fibrillation-she is in  sinus rhythm today, heart rate 57 bpm.  CHA2DS2-VASc score of 5 however anticoagulation has not been pursued secondary to frailty with shared decision making.  Continue Betapace 80 mg twice daily. Coronary artery calcifications-noted on CT imaging, continue aspirin 81 mg daily, continue Zocor 20 mg daily. Stable with no anginal symptoms. No indication for ischemic evaluation.   Anemia of unknown origin-hemoglobin was 9.3 during her ED visit, she is pale, denies any obvious bleeding.  Will recheck CBC today. Hypertension-blood pressure is well-controlled at 124/60, continue Norvasc 2.5 mg daily, continue Betapace 80 mg twice daily. Hyperlipidemia-appears to be managed by PCP, continue Zocor 20 mg daily.       Dispo: CBC today, follow up in 6 months.   Signed, Flossie Dibble, NP

## 2023-03-30 ENCOUNTER — Encounter: Payer: Self-pay | Admitting: Cardiology

## 2023-03-30 ENCOUNTER — Ambulatory Visit: Payer: Medicare HMO | Attending: Cardiology | Admitting: Cardiology

## 2023-03-30 VITALS — BP 124/60 | HR 57 | Ht 62.0 in | Wt 101.0 lb

## 2023-03-30 DIAGNOSIS — I480 Paroxysmal atrial fibrillation: Secondary | ICD-10-CM

## 2023-03-30 DIAGNOSIS — D649 Anemia, unspecified: Secondary | ICD-10-CM | POA: Diagnosis not present

## 2023-03-30 DIAGNOSIS — I2510 Atherosclerotic heart disease of native coronary artery without angina pectoris: Secondary | ICD-10-CM | POA: Diagnosis not present

## 2023-03-30 DIAGNOSIS — I10 Essential (primary) hypertension: Secondary | ICD-10-CM | POA: Diagnosis not present

## 2023-03-30 NOTE — Patient Instructions (Signed)
Medication Instructions:  Your physician recommends that you continue on your current medications as directed. Please refer to the Current Medication list given to you today.  *If you need a refill on your cardiac medications before your next appointment, please call your pharmacy*   Lab Work: Your physician recommends that you return for lab work in: Today for a CBC  If you have labs (blood work) drawn today and your tests are completely normal, you will receive your results only by: MyChart Message (if you have MyChart) OR A paper copy in the mail If you have any lab test that is abnormal or we need to change your treatment, we will call you to review the results.   Testing/Procedures: NONE   Follow-Up: At [facility], you and your health needs are our priority.  As part of our continuing mission to provide you with exceptional heart care, we have created designated Provider Care Teams.  These Care Teams include your primary Cardiologist (physician) and Advanced Practice Providers (APPs -  Physician Assistants and Nurse Practitioners) who all work together to provide you with the care you need, when you need it.  We recommend signing up for the patient portal called "MyChart".  Sign up information is provided on this After Visit Summary.  MyChart is used to connect with patients for Virtual Visits (Telemedicine).  Patients are able to view lab/test results, encounter notes, upcoming appointments, etc.  Non-urgent messages can be sent to your provider as well.   To learn more about what you can do with MyChart, go to [email or web address].    Your next appointment:   6 month(s)  Provider:   Norman Herrlich, MD    Other Instructions

## 2023-03-31 DIAGNOSIS — R1310 Dysphagia, unspecified: Secondary | ICD-10-CM | POA: Diagnosis not present

## 2023-03-31 DIAGNOSIS — I110 Hypertensive heart disease with heart failure: Secondary | ICD-10-CM | POA: Diagnosis not present

## 2023-03-31 DIAGNOSIS — M1990 Unspecified osteoarthritis, unspecified site: Secondary | ICD-10-CM | POA: Diagnosis not present

## 2023-03-31 DIAGNOSIS — I4891 Unspecified atrial fibrillation: Secondary | ICD-10-CM | POA: Diagnosis not present

## 2023-03-31 DIAGNOSIS — I509 Heart failure, unspecified: Secondary | ICD-10-CM | POA: Diagnosis not present

## 2023-03-31 DIAGNOSIS — J189 Pneumonia, unspecified organism: Secondary | ICD-10-CM | POA: Diagnosis not present

## 2023-03-31 DIAGNOSIS — I700 Atherosclerosis of aorta: Secondary | ICD-10-CM | POA: Diagnosis not present

## 2023-03-31 DIAGNOSIS — I058 Other rheumatic mitral valve diseases: Secondary | ICD-10-CM | POA: Diagnosis not present

## 2023-03-31 DIAGNOSIS — J470 Bronchiectasis with acute lower respiratory infection: Secondary | ICD-10-CM | POA: Diagnosis not present

## 2023-03-31 LAB — CBC WITH DIFFERENTIAL/PLATELET
Basophils Absolute: 0 x10E3/uL (ref 0.0–0.2)
Basos: 0 %
EOS (ABSOLUTE): 0.3 x10E3/uL (ref 0.0–0.4)
Eos: 6 %
Hematocrit: 32.7 % — ABNORMAL LOW (ref 34.0–46.6)
Hemoglobin: 10.3 g/dL — ABNORMAL LOW (ref 11.1–15.9)
Immature Grans (Abs): 0 x10E3/uL (ref 0.0–0.1)
Immature Granulocytes: 0 %
Lymphocytes Absolute: 1.5 x10E3/uL (ref 0.7–3.1)
Lymphs: 31 %
MCH: 27 pg (ref 26.6–33.0)
MCHC: 31.5 g/dL (ref 31.5–35.7)
MCV: 86 fL (ref 79–97)
Monocytes Absolute: 0.5 x10E3/uL (ref 0.1–0.9)
Monocytes: 9 %
Neutrophils Absolute: 2.7 x10E3/uL (ref 1.4–7.0)
Neutrophils: 54 %
Platelets: 209 x10E3/uL (ref 150–450)
RBC: 3.82 x10E6/uL (ref 3.77–5.28)
RDW: 15.8 % — ABNORMAL HIGH (ref 11.7–15.4)
WBC: 4.9 x10E3/uL (ref 3.4–10.8)

## 2023-04-06 DIAGNOSIS — J470 Bronchiectasis with acute lower respiratory infection: Secondary | ICD-10-CM | POA: Diagnosis not present

## 2023-04-06 DIAGNOSIS — I700 Atherosclerosis of aorta: Secondary | ICD-10-CM | POA: Diagnosis not present

## 2023-04-06 DIAGNOSIS — J189 Pneumonia, unspecified organism: Secondary | ICD-10-CM | POA: Diagnosis not present

## 2023-04-06 DIAGNOSIS — I058 Other rheumatic mitral valve diseases: Secondary | ICD-10-CM | POA: Diagnosis not present

## 2023-04-06 DIAGNOSIS — I4891 Unspecified atrial fibrillation: Secondary | ICD-10-CM | POA: Diagnosis not present

## 2023-04-06 DIAGNOSIS — I110 Hypertensive heart disease with heart failure: Secondary | ICD-10-CM | POA: Diagnosis not present

## 2023-04-06 DIAGNOSIS — R1310 Dysphagia, unspecified: Secondary | ICD-10-CM | POA: Diagnosis not present

## 2023-04-06 DIAGNOSIS — M1990 Unspecified osteoarthritis, unspecified site: Secondary | ICD-10-CM | POA: Diagnosis not present

## 2023-04-06 DIAGNOSIS — I509 Heart failure, unspecified: Secondary | ICD-10-CM | POA: Diagnosis not present

## 2023-04-08 DIAGNOSIS — R1310 Dysphagia, unspecified: Secondary | ICD-10-CM | POA: Diagnosis not present

## 2023-04-08 DIAGNOSIS — J470 Bronchiectasis with acute lower respiratory infection: Secondary | ICD-10-CM | POA: Diagnosis not present

## 2023-04-08 DIAGNOSIS — I110 Hypertensive heart disease with heart failure: Secondary | ICD-10-CM | POA: Diagnosis not present

## 2023-04-08 DIAGNOSIS — I700 Atherosclerosis of aorta: Secondary | ICD-10-CM | POA: Diagnosis not present

## 2023-04-08 DIAGNOSIS — I058 Other rheumatic mitral valve diseases: Secondary | ICD-10-CM | POA: Diagnosis not present

## 2023-04-08 DIAGNOSIS — J189 Pneumonia, unspecified organism: Secondary | ICD-10-CM | POA: Diagnosis not present

## 2023-04-08 DIAGNOSIS — I4891 Unspecified atrial fibrillation: Secondary | ICD-10-CM | POA: Diagnosis not present

## 2023-04-08 DIAGNOSIS — M1990 Unspecified osteoarthritis, unspecified site: Secondary | ICD-10-CM | POA: Diagnosis not present

## 2023-04-08 DIAGNOSIS — I509 Heart failure, unspecified: Secondary | ICD-10-CM | POA: Diagnosis not present

## 2023-04-14 DIAGNOSIS — J470 Bronchiectasis with acute lower respiratory infection: Secondary | ICD-10-CM | POA: Diagnosis not present

## 2023-04-14 DIAGNOSIS — I4891 Unspecified atrial fibrillation: Secondary | ICD-10-CM | POA: Diagnosis not present

## 2023-04-14 DIAGNOSIS — R1310 Dysphagia, unspecified: Secondary | ICD-10-CM | POA: Diagnosis not present

## 2023-04-14 DIAGNOSIS — I110 Hypertensive heart disease with heart failure: Secondary | ICD-10-CM | POA: Diagnosis not present

## 2023-04-14 DIAGNOSIS — I509 Heart failure, unspecified: Secondary | ICD-10-CM | POA: Diagnosis not present

## 2023-04-14 DIAGNOSIS — M1990 Unspecified osteoarthritis, unspecified site: Secondary | ICD-10-CM | POA: Diagnosis not present

## 2023-04-14 DIAGNOSIS — J189 Pneumonia, unspecified organism: Secondary | ICD-10-CM | POA: Diagnosis not present

## 2023-04-14 DIAGNOSIS — I700 Atherosclerosis of aorta: Secondary | ICD-10-CM | POA: Diagnosis not present

## 2023-04-14 DIAGNOSIS — I058 Other rheumatic mitral valve diseases: Secondary | ICD-10-CM | POA: Diagnosis not present

## 2023-04-20 DIAGNOSIS — J470 Bronchiectasis with acute lower respiratory infection: Secondary | ICD-10-CM | POA: Diagnosis not present

## 2023-04-20 DIAGNOSIS — I110 Hypertensive heart disease with heart failure: Secondary | ICD-10-CM | POA: Diagnosis not present

## 2023-04-20 DIAGNOSIS — I058 Other rheumatic mitral valve diseases: Secondary | ICD-10-CM | POA: Diagnosis not present

## 2023-04-20 DIAGNOSIS — R1310 Dysphagia, unspecified: Secondary | ICD-10-CM | POA: Diagnosis not present

## 2023-04-20 DIAGNOSIS — I4891 Unspecified atrial fibrillation: Secondary | ICD-10-CM | POA: Diagnosis not present

## 2023-04-20 DIAGNOSIS — M1990 Unspecified osteoarthritis, unspecified site: Secondary | ICD-10-CM | POA: Diagnosis not present

## 2023-04-20 DIAGNOSIS — I509 Heart failure, unspecified: Secondary | ICD-10-CM | POA: Diagnosis not present

## 2023-04-20 DIAGNOSIS — I700 Atherosclerosis of aorta: Secondary | ICD-10-CM | POA: Diagnosis not present

## 2023-04-20 DIAGNOSIS — J189 Pneumonia, unspecified organism: Secondary | ICD-10-CM | POA: Diagnosis not present

## 2023-04-21 DIAGNOSIS — J470 Bronchiectasis with acute lower respiratory infection: Secondary | ICD-10-CM | POA: Diagnosis not present

## 2023-04-21 DIAGNOSIS — I110 Hypertensive heart disease with heart failure: Secondary | ICD-10-CM | POA: Diagnosis not present

## 2023-04-21 DIAGNOSIS — R1310 Dysphagia, unspecified: Secondary | ICD-10-CM | POA: Diagnosis not present

## 2023-04-21 DIAGNOSIS — I700 Atherosclerosis of aorta: Secondary | ICD-10-CM | POA: Diagnosis not present

## 2023-04-21 DIAGNOSIS — J189 Pneumonia, unspecified organism: Secondary | ICD-10-CM | POA: Diagnosis not present

## 2023-04-21 DIAGNOSIS — I058 Other rheumatic mitral valve diseases: Secondary | ICD-10-CM | POA: Diagnosis not present

## 2023-04-21 DIAGNOSIS — M1990 Unspecified osteoarthritis, unspecified site: Secondary | ICD-10-CM | POA: Diagnosis not present

## 2023-04-21 DIAGNOSIS — I4891 Unspecified atrial fibrillation: Secondary | ICD-10-CM | POA: Diagnosis not present

## 2023-04-21 DIAGNOSIS — I509 Heart failure, unspecified: Secondary | ICD-10-CM | POA: Diagnosis not present

## 2023-04-22 DIAGNOSIS — I700 Atherosclerosis of aorta: Secondary | ICD-10-CM | POA: Diagnosis not present

## 2023-04-22 DIAGNOSIS — M1990 Unspecified osteoarthritis, unspecified site: Secondary | ICD-10-CM | POA: Diagnosis not present

## 2023-04-22 DIAGNOSIS — J189 Pneumonia, unspecified organism: Secondary | ICD-10-CM | POA: Diagnosis not present

## 2023-04-22 DIAGNOSIS — R1310 Dysphagia, unspecified: Secondary | ICD-10-CM | POA: Diagnosis not present

## 2023-04-22 DIAGNOSIS — I509 Heart failure, unspecified: Secondary | ICD-10-CM | POA: Diagnosis not present

## 2023-04-22 DIAGNOSIS — J470 Bronchiectasis with acute lower respiratory infection: Secondary | ICD-10-CM | POA: Diagnosis not present

## 2023-04-22 DIAGNOSIS — I110 Hypertensive heart disease with heart failure: Secondary | ICD-10-CM | POA: Diagnosis not present

## 2023-04-22 DIAGNOSIS — I4891 Unspecified atrial fibrillation: Secondary | ICD-10-CM | POA: Diagnosis not present

## 2023-04-22 DIAGNOSIS — I058 Other rheumatic mitral valve diseases: Secondary | ICD-10-CM | POA: Diagnosis not present

## 2023-04-23 DIAGNOSIS — I700 Atherosclerosis of aorta: Secondary | ICD-10-CM | POA: Diagnosis not present

## 2023-04-23 DIAGNOSIS — M1990 Unspecified osteoarthritis, unspecified site: Secondary | ICD-10-CM | POA: Diagnosis not present

## 2023-04-23 DIAGNOSIS — I4891 Unspecified atrial fibrillation: Secondary | ICD-10-CM | POA: Diagnosis not present

## 2023-04-23 DIAGNOSIS — R1310 Dysphagia, unspecified: Secondary | ICD-10-CM | POA: Diagnosis not present

## 2023-04-23 DIAGNOSIS — I110 Hypertensive heart disease with heart failure: Secondary | ICD-10-CM | POA: Diagnosis not present

## 2023-04-23 DIAGNOSIS — J189 Pneumonia, unspecified organism: Secondary | ICD-10-CM | POA: Diagnosis not present

## 2023-04-23 DIAGNOSIS — J470 Bronchiectasis with acute lower respiratory infection: Secondary | ICD-10-CM | POA: Diagnosis not present

## 2023-04-23 DIAGNOSIS — I058 Other rheumatic mitral valve diseases: Secondary | ICD-10-CM | POA: Diagnosis not present

## 2023-04-23 DIAGNOSIS — I509 Heart failure, unspecified: Secondary | ICD-10-CM | POA: Diagnosis not present

## 2023-04-27 ENCOUNTER — Other Ambulatory Visit: Payer: Self-pay | Admitting: Otolaryngology

## 2023-04-27 DIAGNOSIS — R1310 Dysphagia, unspecified: Secondary | ICD-10-CM

## 2023-04-28 DIAGNOSIS — I058 Other rheumatic mitral valve diseases: Secondary | ICD-10-CM | POA: Diagnosis not present

## 2023-04-28 DIAGNOSIS — I700 Atherosclerosis of aorta: Secondary | ICD-10-CM | POA: Diagnosis not present

## 2023-04-28 DIAGNOSIS — J189 Pneumonia, unspecified organism: Secondary | ICD-10-CM | POA: Diagnosis not present

## 2023-04-28 DIAGNOSIS — I4891 Unspecified atrial fibrillation: Secondary | ICD-10-CM | POA: Diagnosis not present

## 2023-04-28 DIAGNOSIS — M1990 Unspecified osteoarthritis, unspecified site: Secondary | ICD-10-CM | POA: Diagnosis not present

## 2023-04-28 DIAGNOSIS — I509 Heart failure, unspecified: Secondary | ICD-10-CM | POA: Diagnosis not present

## 2023-04-28 DIAGNOSIS — I110 Hypertensive heart disease with heart failure: Secondary | ICD-10-CM | POA: Diagnosis not present

## 2023-04-28 DIAGNOSIS — J470 Bronchiectasis with acute lower respiratory infection: Secondary | ICD-10-CM | POA: Diagnosis not present

## 2023-04-28 DIAGNOSIS — R1310 Dysphagia, unspecified: Secondary | ICD-10-CM | POA: Diagnosis not present

## 2023-04-29 ENCOUNTER — Ambulatory Visit: Payer: Medicare HMO

## 2023-04-29 DIAGNOSIS — I509 Heart failure, unspecified: Secondary | ICD-10-CM | POA: Diagnosis not present

## 2023-04-29 DIAGNOSIS — J189 Pneumonia, unspecified organism: Secondary | ICD-10-CM | POA: Diagnosis not present

## 2023-04-29 DIAGNOSIS — I058 Other rheumatic mitral valve diseases: Secondary | ICD-10-CM | POA: Diagnosis not present

## 2023-04-29 DIAGNOSIS — J470 Bronchiectasis with acute lower respiratory infection: Secondary | ICD-10-CM | POA: Diagnosis not present

## 2023-04-29 DIAGNOSIS — I110 Hypertensive heart disease with heart failure: Secondary | ICD-10-CM | POA: Diagnosis not present

## 2023-04-29 DIAGNOSIS — M1990 Unspecified osteoarthritis, unspecified site: Secondary | ICD-10-CM | POA: Diagnosis not present

## 2023-04-29 DIAGNOSIS — I4891 Unspecified atrial fibrillation: Secondary | ICD-10-CM | POA: Diagnosis not present

## 2023-04-29 DIAGNOSIS — R1310 Dysphagia, unspecified: Secondary | ICD-10-CM | POA: Diagnosis not present

## 2023-04-29 DIAGNOSIS — I700 Atherosclerosis of aorta: Secondary | ICD-10-CM | POA: Diagnosis not present

## 2023-05-06 ENCOUNTER — Ambulatory Visit
Admission: RE | Admit: 2023-05-06 | Discharge: 2023-05-06 | Disposition: A | Discharge status: Home / Self Care | Payer: Medicare HMO | Source: Ambulatory Visit | Attending: Otolaryngology | Admitting: Otolaryngology

## 2023-05-06 DIAGNOSIS — I058 Other rheumatic mitral valve diseases: Secondary | ICD-10-CM | POA: Diagnosis not present

## 2023-05-06 DIAGNOSIS — R1310 Dysphagia, unspecified: Secondary | ICD-10-CM | POA: Insufficient documentation

## 2023-05-06 DIAGNOSIS — M1990 Unspecified osteoarthritis, unspecified site: Secondary | ICD-10-CM | POA: Diagnosis not present

## 2023-05-06 DIAGNOSIS — J189 Pneumonia, unspecified organism: Secondary | ICD-10-CM | POA: Diagnosis not present

## 2023-05-06 DIAGNOSIS — I110 Hypertensive heart disease with heart failure: Secondary | ICD-10-CM | POA: Diagnosis not present

## 2023-05-06 DIAGNOSIS — I509 Heart failure, unspecified: Secondary | ICD-10-CM | POA: Diagnosis not present

## 2023-05-06 DIAGNOSIS — I700 Atherosclerosis of aorta: Secondary | ICD-10-CM | POA: Diagnosis not present

## 2023-05-06 DIAGNOSIS — I4891 Unspecified atrial fibrillation: Secondary | ICD-10-CM | POA: Diagnosis not present

## 2023-05-06 DIAGNOSIS — J470 Bronchiectasis with acute lower respiratory infection: Secondary | ICD-10-CM | POA: Diagnosis not present

## 2023-05-07 DIAGNOSIS — J189 Pneumonia, unspecified organism: Secondary | ICD-10-CM | POA: Diagnosis not present

## 2023-05-07 DIAGNOSIS — R1310 Dysphagia, unspecified: Secondary | ICD-10-CM | POA: Diagnosis not present

## 2023-05-07 DIAGNOSIS — J470 Bronchiectasis with acute lower respiratory infection: Secondary | ICD-10-CM | POA: Diagnosis not present

## 2023-05-07 DIAGNOSIS — I110 Hypertensive heart disease with heart failure: Secondary | ICD-10-CM | POA: Diagnosis not present

## 2023-05-07 DIAGNOSIS — I700 Atherosclerosis of aorta: Secondary | ICD-10-CM | POA: Diagnosis not present

## 2023-05-07 DIAGNOSIS — M1990 Unspecified osteoarthritis, unspecified site: Secondary | ICD-10-CM | POA: Diagnosis not present

## 2023-05-07 DIAGNOSIS — I4891 Unspecified atrial fibrillation: Secondary | ICD-10-CM | POA: Diagnosis not present

## 2023-05-07 DIAGNOSIS — I058 Other rheumatic mitral valve diseases: Secondary | ICD-10-CM | POA: Diagnosis not present

## 2023-05-07 DIAGNOSIS — I509 Heart failure, unspecified: Secondary | ICD-10-CM | POA: Diagnosis not present

## 2023-05-11 DIAGNOSIS — I509 Heart failure, unspecified: Secondary | ICD-10-CM | POA: Diagnosis not present

## 2023-05-11 DIAGNOSIS — I700 Atherosclerosis of aorta: Secondary | ICD-10-CM | POA: Diagnosis not present

## 2023-05-11 DIAGNOSIS — J189 Pneumonia, unspecified organism: Secondary | ICD-10-CM | POA: Diagnosis not present

## 2023-05-11 DIAGNOSIS — M1990 Unspecified osteoarthritis, unspecified site: Secondary | ICD-10-CM | POA: Diagnosis not present

## 2023-05-11 DIAGNOSIS — I4891 Unspecified atrial fibrillation: Secondary | ICD-10-CM | POA: Diagnosis not present

## 2023-05-11 DIAGNOSIS — I058 Other rheumatic mitral valve diseases: Secondary | ICD-10-CM | POA: Diagnosis not present

## 2023-05-11 DIAGNOSIS — J470 Bronchiectasis with acute lower respiratory infection: Secondary | ICD-10-CM | POA: Diagnosis not present

## 2023-05-11 DIAGNOSIS — R1310 Dysphagia, unspecified: Secondary | ICD-10-CM | POA: Diagnosis not present

## 2023-05-11 DIAGNOSIS — I110 Hypertensive heart disease with heart failure: Secondary | ICD-10-CM | POA: Diagnosis not present

## 2023-05-13 DIAGNOSIS — I700 Atherosclerosis of aorta: Secondary | ICD-10-CM | POA: Diagnosis not present

## 2023-05-13 DIAGNOSIS — J470 Bronchiectasis with acute lower respiratory infection: Secondary | ICD-10-CM | POA: Diagnosis not present

## 2023-05-13 DIAGNOSIS — J189 Pneumonia, unspecified organism: Secondary | ICD-10-CM | POA: Diagnosis not present

## 2023-05-13 DIAGNOSIS — I4891 Unspecified atrial fibrillation: Secondary | ICD-10-CM | POA: Diagnosis not present

## 2023-05-13 DIAGNOSIS — I509 Heart failure, unspecified: Secondary | ICD-10-CM | POA: Diagnosis not present

## 2023-05-13 DIAGNOSIS — M1990 Unspecified osteoarthritis, unspecified site: Secondary | ICD-10-CM | POA: Diagnosis not present

## 2023-05-13 DIAGNOSIS — I110 Hypertensive heart disease with heart failure: Secondary | ICD-10-CM | POA: Diagnosis not present

## 2023-05-13 DIAGNOSIS — R1310 Dysphagia, unspecified: Secondary | ICD-10-CM | POA: Diagnosis not present

## 2023-05-13 DIAGNOSIS — I058 Other rheumatic mitral valve diseases: Secondary | ICD-10-CM | POA: Diagnosis not present

## 2023-05-19 DIAGNOSIS — I509 Heart failure, unspecified: Secondary | ICD-10-CM | POA: Diagnosis not present

## 2023-05-19 DIAGNOSIS — I110 Hypertensive heart disease with heart failure: Secondary | ICD-10-CM | POA: Diagnosis not present

## 2023-05-19 DIAGNOSIS — J470 Bronchiectasis with acute lower respiratory infection: Secondary | ICD-10-CM | POA: Diagnosis not present

## 2023-05-19 DIAGNOSIS — M1990 Unspecified osteoarthritis, unspecified site: Secondary | ICD-10-CM | POA: Diagnosis not present

## 2023-05-19 DIAGNOSIS — J189 Pneumonia, unspecified organism: Secondary | ICD-10-CM | POA: Diagnosis not present

## 2023-05-19 DIAGNOSIS — R1310 Dysphagia, unspecified: Secondary | ICD-10-CM | POA: Diagnosis not present

## 2023-05-19 DIAGNOSIS — I058 Other rheumatic mitral valve diseases: Secondary | ICD-10-CM | POA: Diagnosis not present

## 2023-05-19 DIAGNOSIS — I700 Atherosclerosis of aorta: Secondary | ICD-10-CM | POA: Diagnosis not present

## 2023-05-19 DIAGNOSIS — I4891 Unspecified atrial fibrillation: Secondary | ICD-10-CM | POA: Diagnosis not present

## 2023-05-20 DIAGNOSIS — I700 Atherosclerosis of aorta: Secondary | ICD-10-CM | POA: Diagnosis not present

## 2023-05-20 DIAGNOSIS — J189 Pneumonia, unspecified organism: Secondary | ICD-10-CM | POA: Diagnosis not present

## 2023-05-20 DIAGNOSIS — I509 Heart failure, unspecified: Secondary | ICD-10-CM | POA: Diagnosis not present

## 2023-05-20 DIAGNOSIS — M1990 Unspecified osteoarthritis, unspecified site: Secondary | ICD-10-CM | POA: Diagnosis not present

## 2023-05-20 DIAGNOSIS — R1310 Dysphagia, unspecified: Secondary | ICD-10-CM | POA: Diagnosis not present

## 2023-05-20 DIAGNOSIS — I110 Hypertensive heart disease with heart failure: Secondary | ICD-10-CM | POA: Diagnosis not present

## 2023-05-20 DIAGNOSIS — J470 Bronchiectasis with acute lower respiratory infection: Secondary | ICD-10-CM | POA: Diagnosis not present

## 2023-05-20 DIAGNOSIS — I058 Other rheumatic mitral valve diseases: Secondary | ICD-10-CM | POA: Diagnosis not present

## 2023-05-20 DIAGNOSIS — I4891 Unspecified atrial fibrillation: Secondary | ICD-10-CM | POA: Diagnosis not present

## 2023-06-01 ENCOUNTER — Encounter: Payer: Self-pay | Admitting: Urology

## 2023-06-01 ENCOUNTER — Ambulatory Visit: Payer: Medicare HMO | Admitting: Urology

## 2023-06-01 VITALS — BP 127/74 | HR 60 | Ht 62.0 in | Wt 102.0 lb

## 2023-06-01 DIAGNOSIS — Z87440 Personal history of urinary (tract) infections: Secondary | ICD-10-CM | POA: Diagnosis not present

## 2023-06-01 DIAGNOSIS — Z09 Encounter for follow-up examination after completed treatment for conditions other than malignant neoplasm: Secondary | ICD-10-CM

## 2023-06-01 DIAGNOSIS — N390 Urinary tract infection, site not specified: Secondary | ICD-10-CM

## 2023-06-01 MED ORDER — ESTRADIOL 0.1 MG/GM VA CREA: TOPICAL_CREAM | VAGINAL | 11 refills | Status: AC

## 2023-06-01 MED ORDER — NITROFURANTOIN MONOHYD MACRO 100 MG PO CAPS: 100.0000 mg | ORAL_CAPSULE | Freq: Every day | ORAL | 0 refills | Status: DC

## 2023-06-01 NOTE — Addendum Note (Signed)
Addended by: Sueanne Margarita on: 06/01/2023 02:27 PM   Modules accepted: Orders

## 2023-06-01 NOTE — Progress Notes (Signed)
06/01/23 2:01 PM   Kristy Archer [birth year] [id number]  CC: Recurrent UTI  HPI: 82 year old female who reports at least 1 year of recurrent urinary infections.  She feels like symptoms of dysuria improved with antibiotics, but symptoms typically return within a few days to 2 months after finishing antibiotics.  She reportedly has been followed by urology clinic in [facility], but none of those records are available to me.  She recently completed a course of nitrofurantoin 100 mg twice daily x 10 days, and denies any UTI symptoms today.   PMH: Past Medical History:  Diagnosis Date   Age-related osteoporosis without current pathological fracture 12/03/2021   Chest pain    Chest pain of uncertain etiology 12/05/2021   Chronic atrial fibrillation, unspecified (HCC) 01/16/2014   Last Assessment & Plan:  Formatting of this note might be different from the original. -Home meds: Sotalol 80 mg twice daily, Xarelto 20 mg daily -Sotalol dose had to be temporarily reduced due to slight decrease in renal functions however it was returned to her home dose.  -Heart rate mostly in the 50s, QTC on EKG 11/30/18  478 msec -Xarelto had to be held on admission for insertion of chest tube    Chronic back pain 11/25/2018   Last Assessment & Plan:  Formatting of this note might be different from the original. - Continue home pain medication  - Added miralax daily prn   Chronic pain syndrome 12/03/2021   Constipation 12/03/2018   Last Assessment & Plan:  Formatting of this note might be different from the original. 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 - History of hiatal hernia status post fundoplication in 2005.  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  History of multiple EGDs for dilatation. - S/p thoracotomy with partial gastrectomy/esophagectomy in 2008 with thoracic esophagogastric    Hypomagnesemia 12/01/2018   Last Assessment & Plan:  Formatting of this note might be different from the original. Replaced during hospitalization.   Hypothyroidism 01/16/2014   Last Assessment & Plan:  Formatting of this note might be different from the original. 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   Recurrent major depression in full remission (HCC) 12/03/2021   Tinea unguium 12/03/2021   Type 2 diabetes mellitus without complications (HCC) 12/03/2021   Unspecified severe protein-calorie malnutrition (HCC) 12/03/2021   Urinary tract infectious disease 12/03/2021   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 She has never been exposed to tobacco smoke. She has never used smokeless tobacco. No history on file for alcohol use and drug use.  Physical Exam: BP 127/74   Pulse 60   Ht 5\' 2"  (1.575 m)   Wt 102 lb (46.3 kg)   BMI 18.66 kg/m    Constitutional:  Alert and oriented, No acute distress. Cardiovascular: No clubbing, cyanosis, or edema. Respiratory: Normal respiratory effort, no increased work of breathing. GI: Abdomen is soft, nontender, nondistended, no abdominal masses   Assessment & Plan:   82 year old female with reported recurrent UTIs over the last year, no outside records or prior culture data available.  Denies any symptoms today and recently finished a treatment course of nitrofurantoin.  We discussed the evaluation and treatment of patients with recurrent UTIs at length.  We specifically discussed the differences between asymptomatic bacteriuria and true urinary tract infection.  We discussed the AUA definition of recurrent UTI of at least 2 culture proven symptomatic acute cystitis episodes in a 68-month period, or 3 within a 1 year period.  We discussed the importance of culture directed antibiotic treatment, and antibiotic stewardship.  First-line therapy includes nitrofurantoin(5 days), Bactrim(3 days), or fosfomycin(3 g single dose).  Possible etiologies of recurrent infection include periurethral tissue atrophy in postmenopausal woman, constipation, sexual activity, incomplete emptying, anatomic abnormalities, and even genetic predisposition.  Finally, we discussed the role of perineal hygiene, timed voiding, adequate hydration, topical vaginal estrogen, cranberry prophylaxis, and low-dose antibiotic prophylaxis.  We discussed considering a CT scan and/or cystoscopy for further evaluation, but she opts to start with topical estrogen cream, 90 days nitrofurantoin prophylaxis, and cranberry tablet prophylaxis.  Will try to obtain outside records and culture data.  -Trial of  topical estrogen cream, 90 days nitrofurantoin prophylaxis, and cranberry tablets -RTC 3 months symptom check, if recurrent infections or microscopic hematuria despite above would recommend CT urogram and cystoscopy   Legrand Rams, MD 06/01/2023  [facility] [address] [place], [place] [address] [phone]

## 2023-06-06 ENCOUNTER — Other Ambulatory Visit: Payer: Self-pay

## 2023-06-06 ENCOUNTER — Observation Stay
Admission: EM | Admit: 2023-06-06 | Discharge: 2023-06-07 | Disposition: A | Discharge status: Home / Self Care | Payer: Medicare HMO | Attending: Obstetrics and Gynecology | Admitting: Obstetrics and Gynecology

## 2023-06-06 ENCOUNTER — Emergency Department: Payer: Medicare HMO

## 2023-06-06 DIAGNOSIS — N390 Urinary tract infection, site not specified: Secondary | ICD-10-CM | POA: Diagnosis not present

## 2023-06-06 DIAGNOSIS — I2510 Atherosclerotic heart disease of native coronary artery without angina pectoris: Secondary | ICD-10-CM | POA: Insufficient documentation

## 2023-06-06 DIAGNOSIS — Z7982 Long term (current) use of aspirin: Secondary | ICD-10-CM | POA: Diagnosis not present

## 2023-06-06 DIAGNOSIS — Z79899 Other long term (current) drug therapy: Secondary | ICD-10-CM | POA: Insufficient documentation

## 2023-06-06 DIAGNOSIS — R0789 Other chest pain: Secondary | ICD-10-CM | POA: Diagnosis present

## 2023-06-06 DIAGNOSIS — I10 Essential (primary) hypertension: Secondary | ICD-10-CM | POA: Diagnosis not present

## 2023-06-06 DIAGNOSIS — I482 Chronic atrial fibrillation, unspecified: Secondary | ICD-10-CM | POA: Diagnosis present

## 2023-06-06 DIAGNOSIS — E119 Type 2 diabetes mellitus without complications: Secondary | ICD-10-CM | POA: Diagnosis not present

## 2023-06-06 DIAGNOSIS — R079 Chest pain, unspecified: Secondary | ICD-10-CM | POA: Diagnosis not present

## 2023-06-06 DIAGNOSIS — E876 Hypokalemia: Secondary | ICD-10-CM | POA: Insufficient documentation

## 2023-06-06 DIAGNOSIS — G4733 Obstructive sleep apnea (adult) (pediatric): Secondary | ICD-10-CM | POA: Diagnosis present

## 2023-06-06 DIAGNOSIS — R1319 Other dysphagia: Secondary | ICD-10-CM | POA: Diagnosis present

## 2023-06-06 DIAGNOSIS — E039 Hypothyroidism, unspecified: Secondary | ICD-10-CM | POA: Diagnosis present

## 2023-06-06 LAB — HEPATIC FUNCTION PANEL
ALT: 13 U/L (ref 0–44)
AST: 37 U/L (ref 15–41)
Albumin: 3.5 g/dL (ref 3.5–5.0)
Alkaline Phosphatase: 84 U/L (ref 38–126)
Bilirubin, Direct: 0.2 mg/dL (ref 0.0–0.2)
Indirect Bilirubin: 0.4 mg/dL (ref 0.3–0.9)
Total Bilirubin: 0.6 mg/dL (ref 0.3–1.2)
Total Protein: 6.9 g/dL (ref 6.5–8.1)

## 2023-06-06 LAB — BASIC METABOLIC PANEL
Anion gap: 11 (ref 5–15)
BUN: 13 mg/dL (ref 8–23)
CO2: 29 mmol/L (ref 22–32)
Calcium: 8.6 mg/dL — ABNORMAL LOW (ref 8.9–10.3)
Chloride: 98 mmol/L (ref 98–111)
Creatinine, Ser: 0.56 mg/dL (ref 0.44–1.00)
GFR, Estimated: >60 mL/min (ref >60)
Glucose, Bld: 148 mg/dL — ABNORMAL HIGH (ref 70–99)
Potassium: 3 mmol/L — ABNORMAL LOW (ref 3.5–5.1)
Sodium: 138 mmol/L (ref 135–145)

## 2023-06-06 LAB — CBC
HCT: 37.9 % (ref 36.0–46.0)
Hemoglobin: 11.5 g/dL — ABNORMAL LOW (ref 12.0–15.0)
MCH: 25.7 pg — ABNORMAL LOW (ref 26.0–34.0)
MCHC: 30.3 g/dL (ref 30.0–36.0)
MCV: 84.6 fL (ref 80.0–100.0)
Platelets: 253 10*3/uL (ref 150–400)
RBC: 4.48 MIL/uL (ref 3.87–5.11)
RDW: 16.4 % — ABNORMAL HIGH (ref 11.5–15.5)
WBC: 6.5 10*3/uL (ref 4.0–10.5)
nRBC: 0 % (ref 0.0–0.2)

## 2023-06-06 LAB — MAGNESIUM: Magnesium: 2.1 mg/dL (ref 1.7–2.4)

## 2023-06-06 LAB — TROPONIN I (HIGH SENSITIVITY)
Troponin I (High Sensitivity): 56 ng/L — ABNORMAL HIGH (ref <18)
Troponin I (High Sensitivity): 56 ng/L — ABNORMAL HIGH (ref <18)

## 2023-06-06 MED ORDER — ASPIRIN 81 MG PO TBEC
81.0000 mg | DELAYED_RELEASE_TABLET | Freq: Every day | ORAL | Status: DC
  Administered 2023-06-07: 81 mg via ORAL
  Filled 2023-06-06: qty 1

## 2023-06-06 MED ORDER — SODIUM CHLORIDE 0.9% FLUSH
3.0000 mL | Freq: Two times a day (BID) | INTRAVENOUS | Status: DC
  Administered 2023-06-06 – 2023-06-07 (×2): 3 mL via INTRAVENOUS

## 2023-06-06 MED ORDER — ONDANSETRON HCL 4 MG/2ML IJ SOLN: 4.0000 mg | Freq: Four times a day (QID) | INTRAMUSCULAR | Status: DC | PRN

## 2023-06-06 MED ORDER — POTASSIUM CHLORIDE 20 MEQ PO PACK
40.0000 meq | PACK | Freq: Once | ORAL | Status: AC
  Administered 2023-06-06: 40 meq via ORAL
  Filled 2023-06-06: qty 2

## 2023-06-06 MED ORDER — SODIUM CHLORIDE 0.9 % IV SOLN: 250.0000 mL | INTRAVENOUS | Status: DC | PRN

## 2023-06-06 MED ORDER — SIMVASTATIN 20 MG PO TABS
20.0000 mg | ORAL_TABLET | Freq: Every evening | ORAL | Status: DC
  Administered 2023-06-06: 20 mg via ORAL
  Filled 2023-06-06: qty 2

## 2023-06-06 MED ORDER — OXYCODONE-ACETAMINOPHEN 10-325 MG PO TABS: 1.0000 | ORAL_TABLET | Freq: Four times a day (QID) | ORAL | Status: DC

## 2023-06-06 MED ORDER — NITROGLYCERIN 0.4 MG SL SUBL: 0.4000 mg | SUBLINGUAL_TABLET | SUBLINGUAL | Status: DC | PRN

## 2023-06-06 MED ORDER — LEVOTHYROXINE SODIUM 88 MCG PO TABS
88.0000 ug | ORAL_TABLET | Freq: Every day | ORAL | Status: DC
  Administered 2023-06-07: 88 ug via ORAL
  Filled 2023-06-06 (×2): qty 1

## 2023-06-06 MED ORDER — SERTRALINE HCL 50 MG PO TABS: 100.0000 mg | ORAL_TABLET | Freq: Every day | ORAL | Status: DC

## 2023-06-06 MED ORDER — POTASSIUM CHLORIDE CRYS ER 20 MEQ PO TBCR
40.0000 meq | EXTENDED_RELEASE_TABLET | Freq: Once | ORAL | Status: DC
  Filled 2023-06-06: qty 2

## 2023-06-06 MED ORDER — OXYCODONE-ACETAMINOPHEN 5-325 MG PO TABS: 1.0000 | ORAL_TABLET | Freq: Four times a day (QID) | ORAL | Status: DC

## 2023-06-06 MED ORDER — HYDRALAZINE HCL 20 MG/ML IJ SOLN: 10.0000 mg | INTRAMUSCULAR | Status: DC | PRN

## 2023-06-06 MED ORDER — OXYCODONE-ACETAMINOPHEN 5-325 MG PO TABS: 1.0000 | ORAL_TABLET | Freq: Four times a day (QID) | ORAL | Status: DC | PRN

## 2023-06-06 MED ORDER — SERTRALINE HCL 50 MG PO TABS
100.0000 mg | ORAL_TABLET | Freq: Every day | ORAL | Status: DC
  Administered 2023-06-06: 100 mg via ORAL
  Filled 2023-06-06: qty 2

## 2023-06-06 MED ORDER — ACETAMINOPHEN 325 MG PO TABS: 650.0000 mg | ORAL_TABLET | ORAL | Status: DC | PRN

## 2023-06-06 MED ORDER — OXYCODONE HCL 5 MG PO TABS
5.0000 mg | ORAL_TABLET | Freq: Four times a day (QID) | ORAL | Status: DC | PRN
  Administered 2023-06-06 – 2023-06-07 (×2): 5 mg via ORAL
  Filled 2023-06-06 (×2): qty 1

## 2023-06-06 MED ORDER — POTASSIUM CHLORIDE CRYS ER 20 MEQ PO TBCR: 60.0000 meq | EXTENDED_RELEASE_TABLET | Freq: Once | ORAL | Status: DC

## 2023-06-06 MED ORDER — ENOXAPARIN SODIUM 40 MG/0.4ML IJ SOSY
40.0000 mg | PREFILLED_SYRINGE | INTRAMUSCULAR | Status: DC
  Administered 2023-06-06: 40 mg via SUBCUTANEOUS
  Filled 2023-06-06: qty 0.4

## 2023-06-06 MED ORDER — AMLODIPINE BESYLATE 5 MG PO TABS
2.5000 mg | ORAL_TABLET | Freq: Every day | ORAL | Status: DC
  Administered 2023-06-07: 2.5 mg via ORAL
  Filled 2023-06-06: qty 1

## 2023-06-06 MED ORDER — SODIUM CHLORIDE 0.9% FLUSH: 3.0000 mL | INTRAVENOUS | Status: DC | PRN

## 2023-06-06 MED ORDER — OXYCODONE HCL 5 MG PO TABS: 5.0000 mg | ORAL_TABLET | Freq: Four times a day (QID) | ORAL | Status: DC

## 2023-06-06 MED ORDER — NITROFURANTOIN MONOHYD MACRO 100 MG PO CAPS
100.0000 mg | ORAL_CAPSULE | Freq: Every day | ORAL | Status: DC
  Administered 2023-06-07: 100 mg via ORAL
  Filled 2023-06-06: qty 1

## 2023-06-06 NOTE — ED Triage Notes (Signed)
Pt sts that she has been having chest pain since this morning.

## 2023-06-06 NOTE — H&P (Signed)
History and Physical    Kristy Archer UEA:[id number] DOB: [birth year] DOA: 06/06/2023  PCP: Hortencia Conradi, NP  Patient coming from: home   Chief Complaint: chest pain  HPI: Kristy Archer is a 82 y.o. female with medical history significant for cad on cardiac ct, a fib not anticoagulated, chronic pain, htn, hypothyroid, osa, dm, who presents with the above.  New problem. Began while seated eating lunch. Left chest pain below left breast, didn't radiate, associated w/ mild dyspnea. Lasted for a couple of hours. Not exertional but doesn't exert self. Not pleuritic. No fevers. No abdominal pain. Hasn't happened before. Thinks may have had a heart cath many years ago. Went home and took 4 aspirins and presented here. Chest pain resolved a couple of hours ago.    Review of Systems: As per HPI otherwise 10 point review of systems negative.    Past Medical History:  Diagnosis Date   Age-related osteoporosis without current pathological fracture 12/03/2021   Chest pain    Chest pain of uncertain etiology 12/05/2021   Chronic atrial fibrillation, unspecified (HCC) 01/16/2014   Last Assessment & Plan:  Formatting of this note might be different from the original. -Home meds: Sotalol 80 mg twice daily, Xarelto 20 mg daily -Sotalol dose had to be temporarily reduced due to slight decrease in renal functions however it was returned to her home dose.  -Heart rate mostly in the 50s, QTC on EKG 11/30/18  478 msec -Xarelto had to be held on admission for insertion of chest tube    Chronic back pain 11/25/2018   Last Assessment & Plan:  Formatting of this note might be different from the original. - Continue home pain medication  - Added miralax daily prn   Chronic pain syndrome 12/03/2021   Constipation 12/03/2018   Last Assessment & Plan:  Formatting of this note might be different from the original. 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 - History of hiatal hernia status post fundoplication in 2005.  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  History of multiple EGDs for dilatation. - S/p thoracotomy with partial gastrectomy/esophagectomy in 2008 with thoracic esophagogastric    Hypomagnesemia 12/01/2018   Last Assessment & Plan:  Formatting of this note might be different from the original. Replaced during hospitalization.   Hypothyroidism 01/16/2014   Last Assessment & Plan:  Formatting of this note might be different from the original. 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   Recurrent major depression in full remission (  HCC) 12/03/2021   Tinea unguium 12/03/2021   Type 2 diabetes mellitus without complications (HCC) 12/03/2021   Unspecified severe protein-calorie malnutrition (HCC) 12/03/2021   Urinary tract infectious disease 12/03/2021   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 She has never been exposed to tobacco smoke. She has never used smokeless tobacco. No history on file for alcohol use and drug use.  Allergies  Allergen Reactions   Codeine Anaphylaxis    Other reaction(s): Unknown Can take morphine per patient; also tolerates hydromorphone    Linezolid     Other reaction(s): Angioedema (ALLERGY/intolerance)   Sulfa Antibiotics Itching    Family History  Problem Relation Age of Onset   Diabetes Mother    Cancer Mother    Heart disease Father    Diabetes Father    Hypertension Father     Prior to Admission medications   Medication Sig Start Date End Date Taking? Authorizing Provider  amLODipine (NORVASC) 2.5 MG tablet Take 2.5 mg by mouth daily. 09/28/22   [provider]  aspirin EC 81 MG tablet Take 81 mg by mouth daily.    [provider]  cyanocobalamin 1000 MCG tablet Take 1,000 mcg by mouth daily.    [provider]  estradiol (ESTRACE) 0.1 MG/GM vaginal cream Estrogen Cream Instruction Discard applicator Apply pea sized amount to tip of finger to urethra before bed. Wash hands well after application. Use Monday, Wednesday and Friday 06/01/23   Sondra Come, MD  famotidine (PEPCID) 40 MG tablet Take 40 mg by mouth at bedtime. 10/27/21   [provider]  levothyroxine (SYNTHROID) 88 MCG tablet Take 88 mcg by mouth every morning. 11/05/21   [provider]  meclizine (ANTIVERT) 25 MG tablet Take 25 mg by mouth 3 (three) times daily as needed for dizziness.    [provider]  nitrofurantoin, macrocrystal-monohydrate, (MACROBID) 100 MG capsule Take 1 capsule (100 mg total) by mouth daily. 06/01/23   Sondra Come, MD  omeprazole (PRILOSEC) 40 MG capsule Take 40 mg by mouth 2 (two) times daily. 11/02/21   [provider]  ondansetron (ZOFRAN) 8 MG tablet Take 8 mg by mouth 2 (two) times daily as needed for  nausea/vomiting. 11/26/21   [provider]  oxyCODONE-acetaminophen (PERCOCET) 10-325 MG tablet Take 1 tablet by mouth 4 (four) times daily as needed for pain. 11/21/21   [provider]  sertraline (ZOLOFT) 100 MG tablet Take 100 mg by mouth daily. 10/16/21   [provider]  simvastatin (ZOCOR) 20 MG tablet Take 20 mg by mouth every evening. 10/18/21   [provider]  sotalol (BETAPACE) 80 MG tablet Take 80 mg by mouth every 12 (twelve) hours. 11/01/21   [provider]  sucralfate (CARAFATE) 1 g tablet Take 1 g by mouth 4 (four) times daily. 11/13/21   [provider]    Physical Exam: Vitals:   06/06/23 1412 06/06/23 1413  BP:  (!) 157/74  Pulse:  71  Resp:  18  Temp:  98.7 F (37.1 C)  TempSrc:  Oral  SpO2:  96%  Weight: 46.2 kg   Height: 5\' 2"  (1.575 m)     Constitutional: No acute distress Head: Atraumatic Eyes: Conjunctiva clear Neck: Supple Respiratory: Clear to auscultation bilaterally, no wheezing/rales/rhonchi. Normal  respiratory effort. No accessory muscle use. . Cardiovascular: Regular rate and rhythm. No murmurs/rubs/gallops. Abdomen: Non-tender, non-distended. No masses. No rebound or guarding. Positive bowel sounds. Musculoskeletal: No joint deformity upper and lower extremities. Normal ROM, no contractures. Normal muscle tone.  Skin: No rashes, lesions, or ulcers.  Extremities: No peripheral edema. Palpable peripheral pulses. Neurologic: Alert, moving all 4 extremities. Psychiatric: Normal insight and judgement.   Labs on Admission: I have personally reviewed following labs and imaging studies  CBC: Recent Labs  Lab 06/06/23 1414  WBC 6.5  HGB 11.5*  HCT 37.9  MCV 84.6  PLT 253   Basic Metabolic Panel: Recent Labs  Lab 06/06/23 1414 06/06/23 1640  NA 138  --   K 3.0*  --   CL 98  --   CO2 29  --   GLUCOSE 148*  --   BUN 13  --   CREATININE 0.56  --   CALCIUM 8.6*  --   MG  --  2.1    GFR: Estimated Creatinine Clearance: 40.2 mL/min (by C-G formula based on SCr of 0.56 mg/dL). Liver Function Tests: Recent Labs  Lab 06/06/23 1640  AST 37  ALT 13  ALKPHOS 84  BILITOT 0.6  PROT 6.9  ALBUMIN 3.5   No results for input(s): "LIPASE", "AMYLASE" in the last 168 hours. No results for input(s): "AMMONIA" in the last 168 hours. Coagulation Profile: No results for input(s): "INR", "PROTIME" in the last 168 hours. Cardiac Enzymes: No results for input(s): "CKTOTAL", "CKMB", "CKMBINDEX", "TROPONINI" in the last 168 hours. BNP (last 3 results) No results for input(s): "PROBNP" in the last 8760 hours. HbA1C: No results for input(s): "HGBA1C" in the last 72 hours. CBG: No results for input(s): "GLUCAP" in the last 168 hours. Lipid Profile: No results for input(s): "CHOL", "HDL", "LDLCALC", "TRIG", "CHOLHDL", "LDLDIRECT" in the last 72 hours. Thyroid Function Tests: No results for input(s): "TSH", "T4TOTAL", "FREET4", "T3FREE", "THYROIDAB" in the last 72 hours. Anemia Panel: No results for input(s): "VITAMINB12", "FOLATE", "FERRITIN", "TIBC", "IRON", "RETICCTPCT" in the last 72 hours. Urine analysis: No results found for: "COLORURINE", "APPEARANCEUR", "LABSPEC", "PHURINE", "GLUCOSEU", "HGBUR", "BILIRUBINUR", "KETONESUR", "PROTEINUR", "UROBILINOGEN", "NITRITE", "LEUKOCYTESUR"  Radiological Exams on Admission: DG Chest 2 View  Result Date: 06/06/2023 CLINICAL DATA:  Chest pain EXAM: CHEST - 2 VIEW COMPARISON:  Chest radiograph 04/20/2023 FINDINGS: The cardiomediastinal silhouette is stable and within normal limits There is unchanged asymmetric elevation of the right hemidiaphragm. There is no focal consolidation or pulmonary edema. There is no pleural effusion or pneumothorax. Remote right rib fractures are noted. There is no acute osseous abnormality. Cholecystectomy clips are noted. IMPRESSION: Stable chest with no radiographic evidence of acute cardiopulmonary process.  Electronically Signed   By: Lesia Hausen M.D.   On: 06/06/2023 14:50    EKG: Independently reviewed. nsr  Assessment/Plan Principal Problem:   Chest pain Active Problems:   Chronic atrial fibrillation, unspecified (HCC)   Esophageal dysphagia   Essential hypertension   Hypothyroidism   OSA (obstructive sleep apnea)   Type 2 diabetes mellitus without complications (HCC)   # Chest pain With mild flat troponin elevation. Received aspirin. Chest pain resolved. No hypoxia and clear lungs, doubt significant pulmonary process. Does have coronary calcifications on ct and other risk factors. Bp elevated but not severe - tele - TTE - nuclear stress - lipid profile, A1c - continue asa/statin  # Chronic pain - home oxy  # A-fib Not anticoagulated, follows with cardiology - hold BB pending stress test  #  Hypothyroid - home synthroid  # OSA - cpap ordered  # Dysphagia 2/2 esophageal stricture Gets serial dilations with GI. Reports stable dysphagia w/ solids - monitor  # Hypokalemia 3.0 - replete  # T2DM Appears is diet controlled - monitor  # Chronic uti On macrobid suppression - macrobid  # MDD - home sertraline  # HTN Here bp elevated - hydral prn, home amlodipine  DVT prophylaxis: lovenox Code Status: full  Family Communication: daughter updated @ bedside  Consults called: none   Level of care: Telemetry Cardiac Status is: Observation The patient remains OBS appropriate and will d/c before 2 midnights.    Silvano Bilis MD [facility] Pager [phone]  If 7PM-7AM, please contact night-coverage [email or web address] Password [facility]  06/06/2023, 6:04 PM

## 2023-06-06 NOTE — ED Notes (Addendum)
Hospitalist messaged due to pt being unable to swallow big pills (potassium) asked to switch to alternative form. See orders

## 2023-06-06 NOTE — Progress Notes (Signed)
CPAP therapy refused at this time.

## 2023-06-06 NOTE — ED Notes (Signed)
Pt ambulated to the bathroom with assistance from RN and pt daughter. Pt back in bed, call light within reach.

## 2023-06-06 NOTE — ED Provider Notes (Signed)
[facility] Provider Note    Event Date/Time   First MD Initiated Contact with Patient 06/06/23 1633     (approximate)   History   Chest Pain   HPI  Kristy Archer is a 82 year old female with history of A-fib not on anticoagulation, OSA, diabetes presenting to the emergency department for evaluation of chest pain.  Around 1 PM, patient was eating lunch when she had onset of chest pain described as a sharp sensation underneath her left breast with associated nausea without vomiting.  Additionally reported tingling in her bilateral arms.  Episode lasted for about 45 minutes.  Says she has had something similar before, but not as severe.  Says she has some baseline shortness of breath, did not think this was acutely worse during her episode.  At the time of my initial evaluation, says that her chest pain has resolved.  She did take 324 of aspirin prior to my evaluation.     Physical Exam   Triage Vital Signs: ED Triage Vitals  Encounter Vitals Group     BP 06/06/23 1413 (!) 157/74     Systolic BP Percentile --      Diastolic BP Percentile --      Pulse Rate 06/06/23 1413 71     Resp 06/06/23 1413 18     Temp 06/06/23 1413 98.7 F (37.1 C)     Temp Source 06/06/23 1413 Oral     SpO2 06/06/23 1413 96 %     Weight 06/06/23 1412 101 lb 13.6 oz (46.2 kg)     Height 06/06/23 1412 5\' 2"  (1.575 m)     Head Circumference --      Peak Flow --      Pain Score 06/06/23 1412 7     Pain Loc --      Pain Education --      Exclude from Growth Chart --     Most recent vital signs: Vitals:   06/06/23 2030 06/06/23 2100  BP: (!) 124/54 (!) 141/87  Pulse: 68 67  Resp: 18 (!) 22  Temp:    SpO2: 100% 100%     General: Awake, interactive  CV:  Regular rate, good peripheral perfusion.  Resp:  Lungs clear, unlabored respirations.  Abd:  Soft, nondistended, nontender to palpation Neuro:  Symmetric facial movement, fluid speech   ED Results / Procedures /  Treatments   Labs (all labs ordered are listed, but only abnormal results are displayed) Labs Reviewed  BASIC METABOLIC PANEL - Abnormal; Notable for the following components:      Result Value   Potassium 3.0 (*)    Glucose, Bld 148 (*)    Calcium 8.6 (*)    All other components within normal limits  CBC - Abnormal; Notable for the following components:   Hemoglobin 11.5 (*)    MCH 25.7 (*)    RDW 16.4 (*)    All other components within normal limits  TROPONIN I (HIGH SENSITIVITY) - Abnormal; Notable for the following components:   Troponin I (High Sensitivity) 56 (*)    All other components within normal limits  TROPONIN I (HIGH SENSITIVITY) - Abnormal; Notable for the following components:   Troponin I (High Sensitivity) 56 (*)    All other components within normal limits  MAGNESIUM  HEPATIC FUNCTION PANEL  BASIC METABOLIC PANEL  LIPID PANEL  HEMOGLOBIN A1C     EKG EKG independently reviewed interpreted by myself (ER attending) demonstrates:  EKG  demonstrates sinus rhythm at a rate of 70, PR 118, QRS 82, QTc 486, limited by artifact, T wave inversions noted in anterior leads  Repeat EKG at 1643 demonstrates sinus rhythm at rate of 53, PR 140, QRS 95, QTc 498, persistent slight T wave inversions in anterior leads, no other acute ST findings  RADIOLOGY Imaging independently reviewed and interpreted by myself demonstrates:  CXR without focal consolidation  PROCEDURES:  Critical Care performed: No  Procedures   MEDICATIONS ORDERED IN ED: Medications  aspirin EC tablet 81 mg (has no administration in time range)  nitrofurantoin (macrocrystal-monohydrate) (MACROBID) capsule 100 mg (has no administration in time range)  amLODipine (NORVASC) tablet 2.5 mg (has no administration in time range)  simvastatin (ZOCOR) tablet 20 mg (20 mg Oral Given 06/06/23 2205)  levothyroxine (SYNTHROID) tablet 88 mcg (has no administration in time range)  nitroGLYCERIN (NITROSTAT) SL  tablet 0.4 mg (has no administration in time range)  acetaminophen (TYLENOL) tablet 650 mg (has no administration in time range)  ondansetron (ZOFRAN) injection 4 mg (has no administration in time range)  enoxaparin (LOVENOX) injection 40 mg (40 mg Subcutaneous Given 06/06/23 1848)  sodium chloride flush (NS) 0.9 % injection 3 mL (3 mLs Intravenous Given 06/06/23 2209)  sodium chloride flush (NS) 0.9 % injection 3 mL (has no administration in time range)  0.9 %  sodium chloride infusion (has no administration in time range)  hydrALAZINE (APRESOLINE) injection 10 mg (has no administration in time range)  oxyCODONE-acetaminophen (PERCOCET/ROXICET) 5-325 MG per tablet 1 tablet (has no administration in time range)    And  oxyCODONE (Oxy IR/ROXICODONE) immediate release tablet 5 mg (5 mg Oral Given 06/06/23 2225)  sertraline (ZOLOFT) tablet 100 mg (100 mg Oral Given 06/06/23 2225)  potassium chloride (KLOR-CON) packet 40 mEq (40 mEq Oral Given 06/06/23 1750)     IMPRESSION / MDM / ASSESSMENT AND PLAN / ED COURSE  I reviewed the triage vital signs and the nursing notes.  Differential diagnosis includes, but is not limited to, ACS, pneumonia, pneumothorax, lower suspicion PE, and or dissection given complete resolution of symptoms  Patient's presentation is most consistent with acute presentation with potential threat to life or bodily function.  82 year old female presenting to the emergency department for evaluation of chest pain.  Labs sent from triage notable for elevated troponin at 56, has previously been checked and does not appear to have baseline troponin leak.  Potassium low at 3.  Troponin was drawn shortly after onset of symptoms, so I am concerned that this may increase significantly.  Initial and repeat EKG without significant ST abnormalities.  Will obtain repeat troponin, with consideration for heparin if significantly increased, but ultimate plan for admission.  Repeat troponin  remained stable at 56.  However, this is a new elevation and patient is coming in with acute chest pain with risk factors.  With this, will reach out to hospitalist team to discuss admission.  Reviewed with hospitalist team.  They will the patient for anticipated admission.     FINAL CLINICAL IMPRESSION(S) / ED DIAGNOSES   Final diagnoses:  Acute chest pain     Rx / DC Orders   ED Discharge Orders     None        Note:  This document was prepared using Dragon voice recognition software and may include unintentional dictation errors.   Trinna Post, MD 06/06/23 [phone]

## 2023-06-06 NOTE — ED Notes (Signed)
This RN called pt to room here to a room and she did not answer multiple times, [name] (daughter) called and asked where they were and she said she had left, pt educated to return due to concerning lab values. Daughter states "I will try to get her to come back". This RN educated on importance of pt returning.

## 2023-06-07 ENCOUNTER — Other Ambulatory Visit: Payer: Self-pay | Admitting: Medical

## 2023-06-07 ENCOUNTER — Observation Stay (HOSPITAL_BASED_OUTPATIENT_CLINIC_OR_DEPARTMENT_OTHER): Payer: Medicare HMO

## 2023-06-07 ENCOUNTER — Observation Stay (HOSPITAL_BASED_OUTPATIENT_CLINIC_OR_DEPARTMENT_OTHER)
Admit: 2023-06-07 | Discharge: 2023-06-07 | Disposition: A | Discharge status: Home / Self Care | Payer: Medicare HMO | Attending: Obstetrics and Gynecology | Admitting: Obstetrics and Gynecology

## 2023-06-07 DIAGNOSIS — R079 Chest pain, unspecified: Secondary | ICD-10-CM

## 2023-06-07 LAB — BASIC METABOLIC PANEL
Anion gap: 7 (ref 5–15)
BUN: 10 mg/dL (ref 8–23)
CO2: 31 mmol/L (ref 22–32)
Calcium: 8.2 mg/dL — ABNORMAL LOW (ref 8.9–10.3)
Chloride: 102 mmol/L (ref 98–111)
Creatinine, Ser: 0.44 mg/dL (ref 0.44–1.00)
GFR, Estimated: >60 mL/min (ref >60)
Glucose, Bld: 83 mg/dL (ref 70–99)
Potassium: 3.4 mmol/L — ABNORMAL LOW (ref 3.5–5.1)
Sodium: 140 mmol/L (ref 135–145)

## 2023-06-07 LAB — LIPID PANEL
Cholesterol: 156 mg/dL (ref 0–200)
HDL: 59 mg/dL (ref >40)
LDL Cholesterol: 86 mg/dL (ref 0–99)
Total CHOL/HDL Ratio: 2.6 ratio
Triglycerides: 55 mg/dL (ref <150)
VLDL: 11 mg/dL (ref 0–40)

## 2023-06-07 LAB — NM MYOCAR MULTI W/SPECT W/WALL MOTION / EF
LV dias vol: 26 mL (ref 46–106)
LV sys vol: 7 mL
Nuc Stress EF: 73 %
Peak HR: 96 {beats}/min
Rest HR: 67 {beats}/min
Rest Nuclear Isotope Dose: 10.1 mCi
SDS: 0
SRS: 17
SSS: 5
ST Depression (mm): 0.5 mm
Stress Nuclear Isotope Dose: 30.9 mCi
TID: 0.65

## 2023-06-07 LAB — ECHOCARDIOGRAM COMPLETE
AR max vel: 3.58 cm2
AV Area VTI: 3.25 cm2
AV Area mean vel: 3.31 cm2
AV Mean grad: 6.5 mmHg
AV Peak grad: 11 mmHg
Ao pk vel: 1.66 m/s
Area-P 1/2: 2.58 cm2
Height: 62 in
P 1/2 time: 585 ms
S' Lateral: 2.8 cm
Weight: 1629.64 [oz_av]

## 2023-06-07 MED ORDER — SOTALOL HCL 80 MG PO TABS
80.0000 mg | ORAL_TABLET | Freq: Two times a day (BID) | ORAL | Status: DC
  Administered 2023-06-07: 80 mg via ORAL
  Filled 2023-06-07: qty 1

## 2023-06-07 MED ORDER — AMINOPHYLLINE 25 MG/ML IV (NUC MED): 75.0000 mg | Freq: Once | INTRAVENOUS | 0 refills | Status: DC

## 2023-06-07 MED ORDER — TECHNETIUM TC 99M TETROFOSMIN IV KIT
30.0000 | PACK | Freq: Once | INTRAVENOUS | Status: AC | PRN
  Administered 2023-06-07: 30.86 via INTRAVENOUS

## 2023-06-07 MED ORDER — PANTOPRAZOLE SODIUM 40 MG PO TBEC
40.0000 mg | DELAYED_RELEASE_TABLET | Freq: Every day | ORAL | Status: DC
  Administered 2023-06-07: 40 mg via ORAL
  Filled 2023-06-07: qty 1

## 2023-06-07 MED ORDER — FAMOTIDINE 20 MG PO TABS: 40.0000 mg | ORAL_TABLET | Freq: Every day | ORAL | Status: DC

## 2023-06-07 MED ORDER — REGADENOSON 0.4 MG/5ML IV SOLN
0.4000 mg | Freq: Once | INTRAVENOUS | Status: AC
  Administered 2023-06-07: 0.4 mg via INTRAVENOUS
  Filled 2023-06-07: qty 5

## 2023-06-07 MED ORDER — AMLODIPINE BESYLATE 5 MG PO TABS: 5.0000 mg | ORAL_TABLET | Freq: Every day | ORAL | 1 refills | Status: DC

## 2023-06-07 MED ORDER — TECHNETIUM TC 99M TETROFOSMIN IV KIT
10.0000 | PACK | Freq: Once | INTRAVENOUS | Status: AC | PRN
  Administered 2023-06-07: 10.08 via INTRAVENOUS

## 2023-06-07 MED ORDER — SUCRALFATE 1 G PO TABS
1.0000 g | ORAL_TABLET | Freq: Four times a day (QID) | ORAL | Status: DC
  Administered 2023-06-07: 1 g via ORAL
  Filled 2023-06-07: qty 1

## 2023-06-07 NOTE — ED Notes (Signed)
Informed RN bed assigned

## 2023-06-07 NOTE — ED Notes (Signed)
Pt transported to [facility] for stress test.

## 2023-06-07 NOTE — Discharge Summary (Signed)
Kristy Archer FAO:[id number] DOB: 12-27-1940 DOA: 06/06/2023  PCP: Kristy Conradi, NP  Admit date: 06/06/2023 Discharge date: 06/07/2023  Time spent: 35 minutes  Recommendations for Outpatient Follow-up:  Pcp f/u, titrate bp meds as needed Cardiology f/u     Discharge Diagnoses:  Principal Problem:   Chest pain Active Problems:   Chronic atrial fibrillation, unspecified (HCC)   Esophageal dysphagia   Essential hypertension   Hypothyroidism   OSA (obstructive sleep apnea)   Type 2 diabetes mellitus without complications (HCC)   Discharge Condition: improved  Diet recommendation: heart healthy  Filed Weights   06/06/23 1412  Weight: 46.2 kg    History of present illness:  Kristy Archer is a 82 y.o. female with medical history significant for cad on cardiac ct, a fib not anticoagulated, chronic pain, htn, hypothyroid, osa, dm, who presents with chest pain.   New problem. Began while seated eating lunch. Left chest pain below left breast, didn't radiate, associated w/ mild dyspnea. Lasted for a couple of hours. Not exertional but doesn't exert self. Not pleuritic. No fevers. No abdominal pain. Hasn't happened before. Thinks may have had a heart cath many years ago. Went home and took 4 aspirins and presented here. Chest pain resolved a couple of hours ago.  Hospital Course:  Chest pain resolved prior to arrival, took aspirin at home. Troponin mildly elevated to 54 and stable there on repeat. EKG non-ischemic, is in sinus rhythm. CXR clear. Does have history of coronary calcification on prior CT and risk factors (diabetes, age, a-fib, htn). Evaluated with TTE and nuclear stress test, both unremarkable. Case reviewed with cardiology. Discharged with PCP and cardiology f/u. BP elevated here, improved to 150s by time of discharge, will up-titrate home amlodipine and advise close PCP f/u for continued titration.   Procedures: Nuclear stress test    Consultations: none  Discharge Exam: Vitals:   06/07/23 1300 06/07/23 1354  BP: (!) 164/74 (!) 152/75  Pulse: 72 (!) 51  Resp: 17 18  Temp:  97.9 F (36.6 C)  SpO2: 97% (!) 87%    General: NAD Cardiovascular: RRR Respiratory: CTAB  Discharge Instructions   Discharge Instructions     Diet - low sodium heart healthy   Complete by: As directed    Increase activity slowly   Complete by: As directed       Allergies as of 06/07/2023       Reactions   Codeine Anaphylaxis   Other reaction(s): Unknown Can take morphine per patient; also tolerates hydromorphone   Linezolid    Other reaction(s): Angioedema (ALLERGY/intolerance)   Sulfa Antibiotics Itching        Medication List     TAKE these medications    aminophylline injection Inject 3 mLs (75 mg total) into the vein once for 1 dose.   amLODipine 5 MG tablet Commonly known as: NORVASC Take 1 tablet (5 mg total) by mouth daily. What changed:  medication strength how much to take   aspirin EC 81 MG tablet Take 81 mg by mouth daily.   cyanocobalamin 1000 MCG tablet Take 1,000 mcg by mouth daily.   estradiol 0.1 MG/GM vaginal cream Commonly known as: ESTRACE Estrogen Cream Instruction Discard applicator Apply pea sized amount to tip of finger to urethra before bed. Wash hands well after application. Use Monday, Wednesday and Friday   famotidine 40 MG tablet Commonly known as: PEPCID Take 40 mg by mouth at bedtime.   levothyroxine 88 MCG tablet Commonly known  as: SYNTHROID Take 88 mcg by mouth every morning.   meclizine 25 MG tablet Commonly known as: ANTIVERT Take 25 mg by mouth 3 (three) times daily as needed for dizziness.   metoCLOPramide 10 MG tablet Commonly known as: REGLAN Take 10 mg by mouth 2 (two) times daily before a meal.   nitrofurantoin (macrocrystal-monohydrate) 100 MG capsule Commonly known as: MACROBID Take 1 capsule (100 mg total) by mouth daily.   omeprazole 40 MG  capsule Commonly known as: PRILOSEC Take 40 mg by mouth 2 (two) times daily.   oxyCODONE-acetaminophen 10-325 MG tablet Commonly known as: PERCOCET Take 1 tablet by mouth 4 (four) times daily as needed for pain.   sertraline 100 MG tablet Commonly known as: ZOLOFT Take 100 mg by mouth daily.   simvastatin 20 MG tablet Commonly known as: ZOCOR Take 20 mg by mouth every evening.   sotalol 80 MG tablet Commonly known as: BETAPACE Take 80 mg by mouth every 12 (twelve) hours.   sucralfate 1 g tablet Commonly known as: CARAFATE Take 1 g by mouth 4 (four) times daily.       Allergies  Allergen Reactions   Codeine Anaphylaxis    Other reaction(s): Unknown Can take morphine per patient; also tolerates hydromorphone    Linezolid     Other reaction(s): Angioedema (ALLERGY/intolerance)   Sulfa Antibiotics Itching    Follow-up Information     Kristy Conradi, NP Follow up.   Contact information: [address] [place] [address] [phone]         Kristy Daub, MD Follow up.   Specialties: Cardiology, Radiology Contact information: [address] [place] [address] [phone]                  The results of significant diagnostics from this hospitalization (including imaging, microbiology, ancillary and laboratory) are listed below for reference.    Significant Diagnostic Studies: ECHOCARDIOGRAM COMPLETE  Result Date: 06/07/2023    ECHOCARDIOGRAM REPORT   Patient Name:   Kristy Archer Date of Exam: 06/07/2023 Medical Rec #:  [id number]      Height:       62.0 in Accession #:    [id number]     Weight:       101.9 lb Date of Birth:  Apr 16, 1941      BSA:          1.435 m Patient Age:    82 years       BP:           151/62 mmHg Patient Gender: F              HR:           60 bpm. Exam Location:  [facility] Procedure: 2D Echo, Cardiac Doppler and Color Doppler Indications:     Chest pain R07.9  History:         Patient has prior history of Echocardiogram  examinations, most                  recent 12/16/2021. Arrythmias:Atrial Fibrillation,                  Signs/Symptoms:Chest Pain; Risk Factors:Sleep Apnea.  Sonographer:     Kristy Archer Referring Phys:  [id number] Kristy Archer Kristy Archer Diagnosing Phys: Kristy Bears MD IMPRESSIONS  1. Left ventricular ejection fraction, by estimation, is 70 to 75%. The left ventricle has hyperdynamic function. The left ventricle has no regional wall motion abnormalities. There  is moderate asymmetric left ventricular hypertrophy of the basal-septal  segment. Left ventricular diastolic parameters are consistent with Grade I diastolic dysfunction (impaired relaxation). No significant LVOT gradient at rest or with Valsalva.  2. Right ventricular systolic function is normal. The right ventricular size is normal. Tricuspid regurgitation signal is inadequate for assessing PA pressure.  3. The mitral valve is normal in structure. No evidence of mitral valve regurgitation. No evidence of mitral stenosis. Moderate mitral annular calcification.  4. The aortic valve is normal in structure. Aortic valve regurgitation is mild to moderate. Aortic valve sclerosis/calcification is present, without any evidence of aortic stenosis. FINDINGS  Left Ventricle: Left ventricular ejection fraction, by estimation, is 70 to 75%. The left ventricle has hyperdynamic function. The left ventricle has no regional wall motion abnormalities. The left ventricular internal cavity size was normal in size. There is moderate asymmetric left ventricular hypertrophy of the basal-septal segment. Left ventricular diastolic parameters are consistent with Grade I diastolic dysfunction (impaired relaxation). Right Ventricle: The right ventricular size is normal. No increase in right ventricular wall thickness. Right ventricular systolic function is normal. Tricuspid regurgitation signal is inadequate for assessing PA pressure. Left Atrium: Left atrial size was normal in size. Right  Atrium: Right atrial size was normal in size. Pericardium: There is no evidence of pericardial effusion. Mitral Valve: The mitral valve is normal in structure. There is mild thickening of the mitral valve leaflet(s). Moderate mitral annular calcification. No evidence of mitral valve regurgitation. No evidence of mitral valve stenosis. Tricuspid Valve: The tricuspid valve is normal in structure. Tricuspid valve regurgitation is not demonstrated. No evidence of tricuspid stenosis. Aortic Valve: The aortic valve is normal in structure. Aortic valve regurgitation is mild to moderate. Aortic regurgitation PHT measures 585 msec. Aortic valve sclerosis/calcification is present, without any evidence of aortic stenosis. Aortic valve mean  gradient measures 6.5 mmHg. Aortic valve peak gradient measures 11.0 mmHg. Aortic valve area, by VTI measures 3.25 cm. Pulmonic Valve: The pulmonic valve was normal in structure. Pulmonic valve regurgitation is mild. No evidence of pulmonic stenosis. Aorta: The aortic root is normal in size and structure. Venous: The inferior vena cava was not well visualized. IAS/Shunts: No atrial level shunt detected by color flow Doppler.  LEFT VENTRICLE PLAX 2D LVIDd:         4.10 cm   Diastology LVIDs:         2.80 cm   LV e' medial:    4.35 cm/s LV PW:         1.20 cm   LV E/e' medial:  14.3 LV IVS:        1.30 cm   LV e' lateral:   9.36 cm/s LVOT diam:     2.00 cm   LV E/e' lateral: 6.7 LV SV:         122 LV SV Index:   85 LVOT Area:     3.14 cm  RIGHT VENTRICLE RV Basal diam:  2.40 cm RV Mid diam:    2.20 cm RV S prime:     15.80 cm/s TAPSE (M-mode): 1.7 cm LEFT ATRIUM           Index        RIGHT ATRIUM          Index LA diam:      3.90 cm 2.72 cm/m   RA Area:     8.67 cm LA Vol (A2C): 24.4 ml 17.00 ml/m  RA Volume:   14.60 ml  10.17 ml/m LA Vol (A4C): 23.5 ml 16.38 ml/m  AORTIC VALVE AV Area (Vmax):    3.58 cm AV Area (Vmean):   3.31 cm AV Area (VTI):     3.25 cm AV Vmax:           166.00  cm/s AV Vmean:          114.000 cm/s AV VTI:            0.376 m AV Peak Grad:      11.0 mmHg AV Mean Grad:      6.5 mmHg LVOT Vmax:         189.00 cm/s LVOT Vmean:        120.000 cm/s LVOT VTI:          0.389 m LVOT/AV VTI ratio: 1.04 AI PHT:            585 msec  AORTA Ao Root diam: 3.55 cm MITRAL VALVE               TRICUSPID VALVE MV Area (PHT): 2.58 cm    TR Peak grad:   28.1 mmHg MV Decel Time: 294 msec    TR Vmax:        265.00 cm/s MV E velocity: 62.40 cm/s MV A velocity: 99.00 cm/s  SHUNTS MV E/A ratio:  0.63        Systemic VTI:  0.39 m                            Systemic Diam: 2.00 cm Kristy Bears MD Electronically signed by Kristy Bears MD Signature Date/Time: 06/07/2023/1:51:12 PM    Final    NM Myocar Multi W/Spect Izetta Dakin Motion / EF  Result Date: 06/07/2023   The study is normal. The study is low risk.   0.5 mm of horizontal ST depression in the inferior and inferolateral leads (II, III, aVF, V5 and V6) was noted.   LV perfusion is normal. There is no evidence of ischemia. There is no evidence of infarction.   Left ventricular function is normal. Nuclear stress EF: 73%. The left ventricular ejection fraction is hyperdynamic (>65%). End diastolic cavity size is normal. End systolic cavity size is normal.   CT attenuation images showed mild aortic and coronary calcification   DG Chest 2 View  Result Date: 06/06/2023 CLINICAL DATA:  Chest pain EXAM: CHEST - 2 VIEW COMPARISON:  Chest radiograph 04/20/2023 FINDINGS: The cardiomediastinal silhouette is stable and within normal limits There is unchanged asymmetric elevation of the right hemidiaphragm. There is no focal consolidation or pulmonary edema. There is no pleural effusion or pneumothorax. Remote right rib fractures are noted. There is no acute osseous abnormality. Cholecystectomy clips are noted. IMPRESSION: Stable chest with no radiographic evidence of acute cardiopulmonary process. Electronically Signed   By: Lesia Hausen M.D.   On:  06/06/2023 14:50    Microbiology: No results found for this or any previous visit (from the past 240 hour(s)).   Labs: Basic Metabolic Panel: Recent Labs  Lab 06/06/23 1414 06/06/23 1640 06/07/23 0629  NA 138  --  140  K 3.0*  --  3.4*  CL 98  --  102  CO2 29  --  31  GLUCOSE 148*  --  83  BUN 13  --  10  CREATININE 0.56  --  0.44  CALCIUM 8.6*  --  8.2*  MG  --  2.1  --  Liver Function Tests: Recent Labs  Lab 06/06/23 1640  AST 37  ALT 13  ALKPHOS 84  BILITOT 0.6  PROT 6.9  ALBUMIN 3.5   No results for input(s): "LIPASE", "AMYLASE" in the last 168 hours. No results for input(s): "AMMONIA" in the last 168 hours. CBC: Recent Labs  Lab 06/06/23 1414  WBC 6.5  HGB 11.5*  HCT 37.9  MCV 84.6  PLT 253   Cardiac Enzymes: No results for input(s): "CKTOTAL", "CKMB", "CKMBINDEX", "TROPONINI" in the last 168 hours. BNP: BNP (last 3 results) No results for input(s): "BNP" in the last 8760 hours.  ProBNP (last 3 results) No results for input(s): "PROBNP" in the last 8760 hours.  CBG: No results for input(s): "GLUCAP" in the last 168 hours.     Signed:  Silvano Bilis MD.  [facility] 06/07/2023, 2:22 PM

## 2023-06-07 NOTE — ED Notes (Signed)
Pt denies CP at this time

## 2023-06-07 NOTE — ED Notes (Signed)
Echo at bedside

## 2023-06-08 LAB — HEMOGLOBIN A1C
Hgb A1c MFr Bld: 5.8 % — ABNORMAL HIGH (ref 4.8–5.6)
Mean Plasma Glucose: 120 mg/dL

## 2023-07-06 DIAGNOSIS — K222 Esophageal obstruction: Secondary | ICD-10-CM | POA: Insufficient documentation

## 2023-07-13 ENCOUNTER — Other Ambulatory Visit: Payer: Self-pay

## 2023-07-13 ENCOUNTER — Encounter: Payer: Self-pay | Admitting: *Deleted

## 2023-07-13 ENCOUNTER — Inpatient Hospital Stay
Admission: EM | Admit: 2023-07-13 | Discharge: 2023-07-20 | DRG: 388 | Disposition: A | Discharge status: Home Health | Payer: Medicare HMO | Attending: Internal Medicine | Admitting: Internal Medicine

## 2023-07-13 ENCOUNTER — Emergency Department: Payer: Medicare HMO

## 2023-07-13 DIAGNOSIS — E43 Unspecified severe protein-calorie malnutrition: Secondary | ICD-10-CM | POA: Insufficient documentation

## 2023-07-13 DIAGNOSIS — H9193 Unspecified hearing loss, bilateral: Secondary | ICD-10-CM | POA: Diagnosis present

## 2023-07-13 DIAGNOSIS — E11649 Type 2 diabetes mellitus with hypoglycemia without coma: Secondary | ICD-10-CM | POA: Diagnosis present

## 2023-07-13 DIAGNOSIS — Z7901 Long term (current) use of anticoagulants: Secondary | ICD-10-CM

## 2023-07-13 DIAGNOSIS — G894 Chronic pain syndrome: Secondary | ICD-10-CM | POA: Diagnosis present

## 2023-07-13 DIAGNOSIS — M810 Age-related osteoporosis without current pathological fracture: Secondary | ICD-10-CM | POA: Diagnosis present

## 2023-07-13 DIAGNOSIS — E871 Hypo-osmolality and hyponatremia: Secondary | ICD-10-CM | POA: Diagnosis present

## 2023-07-13 DIAGNOSIS — K219 Gastro-esophageal reflux disease without esophagitis: Secondary | ICD-10-CM | POA: Diagnosis present

## 2023-07-13 DIAGNOSIS — E119 Type 2 diabetes mellitus without complications: Secondary | ICD-10-CM

## 2023-07-13 DIAGNOSIS — Z903 Acquired absence of stomach [part of]: Secondary | ICD-10-CM

## 2023-07-13 DIAGNOSIS — Z8249 Family history of ischemic heart disease and other diseases of the circulatory system: Secondary | ICD-10-CM

## 2023-07-13 DIAGNOSIS — N179 Acute kidney failure, unspecified: Secondary | ICD-10-CM | POA: Diagnosis present

## 2023-07-13 DIAGNOSIS — Z1152 Encounter for screening for COVID-19: Secondary | ICD-10-CM

## 2023-07-13 DIAGNOSIS — I482 Chronic atrial fibrillation, unspecified: Secondary | ICD-10-CM | POA: Diagnosis present

## 2023-07-13 DIAGNOSIS — R1314 Dysphagia, pharyngoesophageal phase: Secondary | ICD-10-CM | POA: Diagnosis present

## 2023-07-13 DIAGNOSIS — Z681 Body mass index (BMI) 19 or less, adult: Secondary | ICD-10-CM

## 2023-07-13 DIAGNOSIS — R079 Chest pain, unspecified: Secondary | ICD-10-CM | POA: Diagnosis present

## 2023-07-13 DIAGNOSIS — R1319 Other dysphagia: Secondary | ICD-10-CM | POA: Diagnosis present

## 2023-07-13 DIAGNOSIS — Z833 Family history of diabetes mellitus: Secondary | ICD-10-CM

## 2023-07-13 DIAGNOSIS — K5651 Intestinal adhesions [bands], with partial obstruction: Principal | ICD-10-CM | POA: Diagnosis present

## 2023-07-13 DIAGNOSIS — Z885 Allergy status to narcotic agent status: Secondary | ICD-10-CM

## 2023-07-13 DIAGNOSIS — R109 Unspecified abdominal pain: Secondary | ICD-10-CM | POA: Diagnosis not present

## 2023-07-13 DIAGNOSIS — R1030 Lower abdominal pain, unspecified: Principal | ICD-10-CM

## 2023-07-13 DIAGNOSIS — Z9049 Acquired absence of other specified parts of digestive tract: Secondary | ICD-10-CM

## 2023-07-13 DIAGNOSIS — Z7982 Long term (current) use of aspirin: Secondary | ICD-10-CM

## 2023-07-13 DIAGNOSIS — E876 Hypokalemia: Secondary | ICD-10-CM | POA: Diagnosis present

## 2023-07-13 DIAGNOSIS — I10 Essential (primary) hypertension: Secondary | ICD-10-CM | POA: Diagnosis present

## 2023-07-13 DIAGNOSIS — D72829 Elevated white blood cell count, unspecified: Secondary | ICD-10-CM | POA: Diagnosis present

## 2023-07-13 DIAGNOSIS — L89159 Pressure ulcer of sacral region, unspecified stage: Secondary | ICD-10-CM | POA: Diagnosis present

## 2023-07-13 DIAGNOSIS — K56609 Unspecified intestinal obstruction, unspecified as to partial versus complete obstruction: Secondary | ICD-10-CM | POA: Diagnosis present

## 2023-07-13 DIAGNOSIS — R64 Cachexia: Secondary | ICD-10-CM | POA: Diagnosis present

## 2023-07-13 DIAGNOSIS — Z79890 Hormone replacement therapy (postmenopausal): Secondary | ICD-10-CM

## 2023-07-13 DIAGNOSIS — E782 Mixed hyperlipidemia: Secondary | ICD-10-CM | POA: Diagnosis present

## 2023-07-13 DIAGNOSIS — Z882 Allergy status to sulfonamides status: Secondary | ICD-10-CM

## 2023-07-13 DIAGNOSIS — I2489 Other forms of acute ischemic heart disease: Secondary | ICD-10-CM | POA: Diagnosis present

## 2023-07-13 DIAGNOSIS — E039 Hypothyroidism, unspecified: Secondary | ICD-10-CM | POA: Diagnosis present

## 2023-07-13 LAB — CBC
HCT: 42 % (ref 36.0–46.0)
Hemoglobin: 12.7 g/dL (ref 12.0–15.0)
MCH: 25.1 pg — ABNORMAL LOW (ref 26.0–34.0)
MCHC: 30.2 g/dL (ref 30.0–36.0)
MCV: 83 fL (ref 80.0–100.0)
Platelets: 284 10*3/uL (ref 150–400)
RBC: 5.06 MIL/uL (ref 3.87–5.11)
RDW: 16.1 % — ABNORMAL HIGH (ref 11.5–15.5)
WBC: 12 10*3/uL — ABNORMAL HIGH (ref 4.0–10.5)
nRBC: 0 % (ref 0.0–0.2)

## 2023-07-13 NOTE — ED Provider Notes (Signed)
[facility] Provider Note   Event Date/Time   First MD Initiated Contact with Patient 07/13/23 2053     (approximate) History  Abdominal Pain  HPI Kristy Archer is a 82 y.o. female with a past medical history of hypertension, chronic A-fib, hypothyroidism, and status post appendectomy presents for bilateral lower quadrant abdominal pain that is been present over the last 2 days and has been associated with nausea as well as dry heaving.  Patient states that she has had bowel obstructions in the past that felt somewhat similar.  Patient states last bowel movement was yesterday ROS: Patient currently denies any vision changes, tinnitus, difficulty speaking, facial droop, sore throat, chest pain, shortness of breath, diarrhea, dysuria, or weakness/numbness/paresthesias in any extremity   Physical Exam  Triage Vital Signs: ED Triage Vitals  Encounter Vitals Group     BP 07/13/23 1958 (!) 103/56     Systolic BP Percentile --      Diastolic BP Percentile --      Pulse Rate 07/13/23 1958 80     Resp 07/13/23 1958 20     Temp 07/13/23 1958 (!) 97.5 F (36.4 C)     Temp Source 07/13/23 1958 Oral     SpO2 07/13/23 1927 95 %     Weight 07/13/23 1956 101 lb 6.6 oz (46 kg)     Height 07/13/23 1956 5\' 2"  (1.575 m)     Head Circumference --      Peak Flow --      Pain Score 07/13/23 1956 5     Pain Loc --      Pain Education --      Exclude from Growth Chart --    Most recent vital signs: Vitals:   07/13/23 1927 07/13/23 1958  BP:  (!) 103/56  Pulse:  80  Resp:  20  Temp:  (!) 97.5 F (36.4 C)  SpO2: 95% 95%   General: Awake, oriented x4. CV:  Good peripheral perfusion.  Resp:  Normal effort.  Abd:  No distention.  Bilateral lower quadrant tenderness to palpation Other:  Elderly well-developed, well-nourished Caucasian female resting comfortably in no acute distress ED Results / Procedures / Treatments  Labs (all labs ordered are listed, but only  abnormal results are displayed) Labs Reviewed  CBC - Abnormal; Notable for the following components:      Result Value   WBC 12.0 (*)    MCH 25.1 (*)    RDW 16.1 (*)    All other components within normal limits  URINALYSIS, ROUTINE W REFLEX MICROSCOPIC  BASIC METABOLIC PANEL  TROPONIN I (HIGH SENSITIVITY)  TROPONIN I (HIGH SENSITIVITY)   EKG ED ECG REPORT I, Merwyn Katos, the attending physician, personally viewed and interpreted this ECG. Date: 07/13/2023 EKG Time: 2003 Rate: 74 Rhythm: normal sinus rhythm QRS Axis: normal Intervals: normal ST/T Wave abnormalities: normal Narrative Interpretation: no evidence of acute ischemia RADIOLOGY ED MD interpretation: One-view chest x-ray interpreted by me shows no evidence of acute abnormalities including no pneumonia, pneumothorax, or widened mediastinum.  Elevation of right hemidiaphragm unchanged from previous -Agree with radiology assessment Official radiology report(s): DG Chest 1 View  Result Date: 07/13/2023 CLINICAL DATA:  Abdominal pain. EXAM: CHEST  1 VIEW COMPARISON:  06/06/2023 FINDINGS: Elevation of the right hemidiaphragm. No change since prior study. Heart size and pulmonary vascularity are normal. No airspace disease or consolidation. No pleural effusions. Mediastinal contours appear intact. Soft tissue shadow projecting over the right  chest. Surgical clips in the right upper quadrant. Old posttraumatic or postoperative rib deformities in the right upper chest. Degenerative changes in the spine and shoulders. IMPRESSION: Elevation of the right hemidiaphragm is unchanged. No evidence of active pulmonary disease. Electronically Signed   By: Burman Nieves M.D.   On: 07/13/2023 22:20   PROCEDURES: Critical Care performed: No Procedures MEDICATIONS ORDERED IN ED: Medications - No data to display IMPRESSION / MDM / ASSESSMENT AND PLAN / ED COURSE  I reviewed the triage vital signs and the nursing notes.                              The patient is on the cardiac monitor to evaluate for evidence of arrhythmia and/or significant heart rate changes. Patient's presentation is most consistent with acute presentation with potential threat to life or bodily function. Patient's symptoms not typical for emergent causes of abdominal pain such as, but not limited to, appendicitis, abdominal aortic aneurysm, surgical biliary disease, pancreatitis, SBO, mesenteric ischemia, serious intra-abdominal bacterial illness. Presentation also not typical of gynecologic emergencies such as TOA, Ovarian Torsion, PID. Not Ectopic. Doubt atypical ACS.  Care of this patient will be signed out to the oncoming physician at the end of my shift.  All pertinent patient information conveyed and all questions answered.  All further care and disposition decisions will be made by the oncoming physician.   FINAL CLINICAL IMPRESSION(S) / ED DIAGNOSES   Final diagnoses:  None   Rx / DC Orders   ED Discharge Orders     None      Note:  This document was prepared using Dragon voice recognition software and may include unintentional dictation errors.   Merwyn Katos, MD 07/13/23 [phone]

## 2023-07-13 NOTE — ED Triage Notes (Signed)
EMS brings pt in from home for c/o abd pain accomp by nausea (no emesis), last BM 3 days ago

## 2023-07-13 NOTE — ED Triage Notes (Signed)
Pt brought in via ems from home.    Pt has abd pain.  No v/d  pt reports dry heaves.  Pt alert.   Pt is hard of hearing.    Family with pt.

## 2023-07-14 ENCOUNTER — Inpatient Hospital Stay: Payer: Medicare HMO

## 2023-07-14 ENCOUNTER — Emergency Department: Payer: Medicare HMO

## 2023-07-14 DIAGNOSIS — R1319 Other dysphagia: Secondary | ICD-10-CM | POA: Diagnosis not present

## 2023-07-14 DIAGNOSIS — Z9889 Other specified postprocedural states: Secondary | ICD-10-CM

## 2023-07-14 DIAGNOSIS — R1030 Lower abdominal pain, unspecified: Secondary | ICD-10-CM | POA: Diagnosis not present

## 2023-07-14 DIAGNOSIS — N179 Acute kidney failure, unspecified: Secondary | ICD-10-CM | POA: Diagnosis present

## 2023-07-14 DIAGNOSIS — Z79890 Hormone replacement therapy (postmenopausal): Secondary | ICD-10-CM | POA: Diagnosis not present

## 2023-07-14 DIAGNOSIS — I10 Essential (primary) hypertension: Secondary | ICD-10-CM | POA: Diagnosis present

## 2023-07-14 DIAGNOSIS — I2489 Other forms of acute ischemic heart disease: Secondary | ICD-10-CM | POA: Diagnosis present

## 2023-07-14 DIAGNOSIS — M810 Age-related osteoporosis without current pathological fracture: Secondary | ICD-10-CM | POA: Diagnosis present

## 2023-07-14 DIAGNOSIS — E1169 Type 2 diabetes mellitus with other specified complication: Secondary | ICD-10-CM

## 2023-07-14 DIAGNOSIS — Z515 Encounter for palliative care: Secondary | ICD-10-CM | POA: Diagnosis not present

## 2023-07-14 DIAGNOSIS — R7989 Other specified abnormal findings of blood chemistry: Secondary | ICD-10-CM | POA: Diagnosis not present

## 2023-07-14 DIAGNOSIS — K5651 Intestinal adhesions [bands], with partial obstruction: Secondary | ICD-10-CM | POA: Diagnosis present

## 2023-07-14 DIAGNOSIS — Z1152 Encounter for screening for COVID-19: Secondary | ICD-10-CM | POA: Diagnosis not present

## 2023-07-14 DIAGNOSIS — R079 Chest pain, unspecified: Secondary | ICD-10-CM | POA: Diagnosis not present

## 2023-07-14 DIAGNOSIS — E871 Hypo-osmolality and hyponatremia: Secondary | ICD-10-CM | POA: Diagnosis present

## 2023-07-14 DIAGNOSIS — Z8719 Personal history of other diseases of the digestive system: Secondary | ICD-10-CM

## 2023-07-14 DIAGNOSIS — E43 Unspecified severe protein-calorie malnutrition: Secondary | ICD-10-CM | POA: Diagnosis present

## 2023-07-14 DIAGNOSIS — E782 Mixed hyperlipidemia: Secondary | ICD-10-CM | POA: Diagnosis present

## 2023-07-14 DIAGNOSIS — Z8249 Family history of ischemic heart disease and other diseases of the circulatory system: Secondary | ICD-10-CM | POA: Diagnosis not present

## 2023-07-14 DIAGNOSIS — G894 Chronic pain syndrome: Secondary | ICD-10-CM | POA: Diagnosis present

## 2023-07-14 DIAGNOSIS — Z9049 Acquired absence of other specified parts of digestive tract: Secondary | ICD-10-CM

## 2023-07-14 DIAGNOSIS — I482 Chronic atrial fibrillation, unspecified: Secondary | ICD-10-CM

## 2023-07-14 DIAGNOSIS — K56609 Unspecified intestinal obstruction, unspecified as to partial versus complete obstruction: Secondary | ICD-10-CM | POA: Diagnosis not present

## 2023-07-14 DIAGNOSIS — R109 Unspecified abdominal pain: Secondary | ICD-10-CM | POA: Diagnosis present

## 2023-07-14 DIAGNOSIS — R64 Cachexia: Secondary | ICD-10-CM | POA: Diagnosis present

## 2023-07-14 DIAGNOSIS — E039 Hypothyroidism, unspecified: Secondary | ICD-10-CM | POA: Diagnosis present

## 2023-07-14 DIAGNOSIS — L89159 Pressure ulcer of sacral region, unspecified stage: Secondary | ICD-10-CM | POA: Diagnosis present

## 2023-07-14 DIAGNOSIS — Z882 Allergy status to sulfonamides status: Secondary | ICD-10-CM | POA: Diagnosis not present

## 2023-07-14 DIAGNOSIS — Z7982 Long term (current) use of aspirin: Secondary | ICD-10-CM | POA: Diagnosis not present

## 2023-07-14 DIAGNOSIS — Z7901 Long term (current) use of anticoagulants: Secondary | ICD-10-CM | POA: Diagnosis not present

## 2023-07-14 DIAGNOSIS — E119 Type 2 diabetes mellitus without complications: Secondary | ICD-10-CM | POA: Diagnosis not present

## 2023-07-14 DIAGNOSIS — Z681 Body mass index (BMI) 19 or less, adult: Secondary | ICD-10-CM | POA: Diagnosis not present

## 2023-07-14 DIAGNOSIS — E11649 Type 2 diabetes mellitus with hypoglycemia without coma: Secondary | ICD-10-CM | POA: Diagnosis present

## 2023-07-14 DIAGNOSIS — Z833 Family history of diabetes mellitus: Secondary | ICD-10-CM | POA: Diagnosis not present

## 2023-07-14 DIAGNOSIS — D72829 Elevated white blood cell count, unspecified: Secondary | ICD-10-CM | POA: Diagnosis present

## 2023-07-14 DIAGNOSIS — E876 Hypokalemia: Secondary | ICD-10-CM | POA: Diagnosis present

## 2023-07-14 LAB — BASIC METABOLIC PANEL
Anion gap: 10 (ref 5–15)
BUN: 14 mg/dL (ref 8–23)
CO2: 28 mmol/L (ref 22–32)
Calcium: 8.7 mg/dL — ABNORMAL LOW (ref 8.9–10.3)
Chloride: 94 mmol/L — ABNORMAL LOW (ref 98–111)
Creatinine, Ser: 0.89 mg/dL (ref 0.44–1.00)
GFR, Estimated: >60 mL/min (ref >60)
Glucose, Bld: 187 mg/dL — ABNORMAL HIGH (ref 70–99)
Potassium: 4 mmol/L (ref 3.5–5.1)
Sodium: 132 mmol/L — ABNORMAL LOW (ref 135–145)

## 2023-07-14 LAB — CBG MONITORING, ED
Glucose-Capillary: 124 mg/dL — ABNORMAL HIGH (ref 70–99)
Glucose-Capillary: 105 mg/dL — ABNORMAL HIGH (ref 70–99)
Glucose-Capillary: 113 mg/dL — ABNORMAL HIGH (ref 70–99)

## 2023-07-14 LAB — TROPONIN I (HIGH SENSITIVITY)
Troponin I (High Sensitivity): 31 ng/L — ABNORMAL HIGH (ref <18)
Troponin I (High Sensitivity): 58 ng/L — ABNORMAL HIGH (ref <18)
Troponin I (High Sensitivity): 80 ng/L — ABNORMAL HIGH (ref <18)
Troponin I (High Sensitivity): 99 ng/L — ABNORMAL HIGH (ref <18)
Troponin I (High Sensitivity): 115 ng/L (ref <18)

## 2023-07-14 LAB — GLUCOSE, CAPILLARY: Glucose-Capillary: 116 mg/dL — ABNORMAL HIGH (ref 70–99)

## 2023-07-14 LAB — D-DIMER, QUANTITATIVE: D-Dimer, Quant: 2.14 ug{FEU}/mL — ABNORMAL HIGH (ref 0.00–0.50)

## 2023-07-14 MED ORDER — ENOXAPARIN SODIUM 40 MG/0.4ML IJ SOSY: 40.0000 mg | PREFILLED_SYRINGE | INTRAMUSCULAR | Status: DC

## 2023-07-14 MED ORDER — IOHEXOL 300 MG/ML  SOLN
75.0000 mL | Freq: Once | INTRAMUSCULAR | Status: AC | PRN
  Administered 2023-07-14: 75 mL via INTRAVENOUS

## 2023-07-14 MED ORDER — ONDANSETRON HCL 4 MG PO TABS
4.0000 mg | ORAL_TABLET | Freq: Four times a day (QID) | ORAL | Status: DC | PRN
  Administered 2023-07-20: 4 mg
  Filled 2023-07-14: qty 1

## 2023-07-14 MED ORDER — ENOXAPARIN SODIUM 40 MG/0.4ML IJ SOSY
40.0000 mg | PREFILLED_SYRINGE | INTRAMUSCULAR | Status: DC
  Administered 2023-07-14: 40 mg via SUBCUTANEOUS
  Filled 2023-07-14: qty 0.4

## 2023-07-14 MED ORDER — SODIUM CHLORIDE 0.9 % IV SOLN: INTRAVENOUS | Status: DC

## 2023-07-14 MED ORDER — DEXTROSE-SODIUM CHLORIDE 5-0.45 % IV SOLN: INTRAVENOUS | Status: AC

## 2023-07-14 MED ORDER — INSULIN ASPART 100 UNIT/ML IJ SOLN
0.0000 [IU] | INTRAMUSCULAR | Status: DC
  Administered 2023-07-14 – 2023-07-16 (×3): 1 [IU] via SUBCUTANEOUS
  Administered 2023-07-19: 2 [IU] via SUBCUTANEOUS
  Filled 2023-07-14 (×4): qty 1

## 2023-07-14 MED ORDER — HYDROMORPHONE HCL 1 MG/ML IJ SOLN
0.5000 mg | INTRAMUSCULAR | Status: DC | PRN
  Administered 2023-07-14 – 2023-07-18 (×18): 0.5 mg via INTRAVENOUS
  Filled 2023-07-14 (×18): qty 0.5

## 2023-07-14 MED ORDER — LACTATED RINGERS IV BOLUS: 500.0000 mL | Freq: Once | INTRAVENOUS | Status: DC

## 2023-07-14 MED ORDER — MORPHINE SULFATE (PF) 4 MG/ML IV SOLN
4.0000 mg | Freq: Once | INTRAVENOUS | Status: AC
  Administered 2023-07-14: 4 mg via INTRAVENOUS
  Filled 2023-07-14: qty 1

## 2023-07-14 MED ORDER — SODIUM CHLORIDE 0.9 % IV BOLUS
500.0000 mL | Freq: Once | INTRAVENOUS | Status: AC
  Administered 2023-07-14: 500 mL via INTRAVENOUS

## 2023-07-14 MED ORDER — ONDANSETRON HCL 4 MG/2ML IJ SOLN
4.0000 mg | Freq: Four times a day (QID) | INTRAMUSCULAR | Status: DC | PRN
  Administered 2023-07-14 – 2023-07-20 (×9): 4 mg via INTRAVENOUS
  Filled 2023-07-14 (×10): qty 2

## 2023-07-14 MED ORDER — IOHEXOL 350 MG/ML SOLN
75.0000 mL | Freq: Once | INTRAVENOUS | Status: AC | PRN
  Administered 2023-07-14: 75 mL via INTRAVENOUS

## 2023-07-14 NOTE — Assessment & Plan Note (Signed)
BP stable  Titrate BP regimen

## 2023-07-14 NOTE — Assessment & Plan Note (Addendum)
Recurrent nausea with noted SBO on CT imaging  Noted prior GI/surgical history including esophagectomy and gastric pull through in 2009, exploratory laparotomy and LOA in 2015, as well as appendectomy and cholecystectomy  NG tube placed at the bedside  Case discussed w/ Dr. Aleen Campi overnight  Follow up general surgery recommendations  Follow

## 2023-07-14 NOTE — H&P (Addendum)
History and Physical    Patient: Kristy Archer FTD:[id number] DOB: Oct 08, 1940 DOA: 07/13/2023 DOS: the patient was seen and examined on 07/14/2023 PCP: Hortencia Conradi, NP  Patient coming from: Home  Chief Complaint:  Chief Complaint  Patient presents with   Abdominal Pain   HPI: Kristy Archer is a 82 y.o. 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  Limited history as patient is currently sleeping and hard of hearing.  History primarily from nursing staff. History also from daughter Yolande Jolly ([id number]). Per report, pt w/ recurrent dry heaves over past 24 hours. + generalized abd pain. No chest pain. Follows GI at [facility]. Last EGD was within the past 2 weeks. Was unable to properly do esophageal dilatation at that time per report. Last similar episode of SBO was in 2015 per the daughter. No reported dietary changes. No fevers or chills. No reported diarrhea or SOB.  Presented to the ER afebrile, hemodynamically stable.  White count 12, hemoglobin 12.7, platelets 284, creatinine 0.9, glucose 190.  Troponin in the 30s.  CT of the abdomen pelvis positive for small bowel obstruction.  Dr. Aleen Campi with general surgery consulted with recommendation for NG tube placement.  NG tube placement shortened in the setting of prior esophagectomy. Review of Systems: As mentioned in the history of present illness. All other systems reviewed and are negative. Past Medical History:  Diagnosis Date   Age-related osteoporosis without current pathological fracture 12/03/2021   Chest pain    Chest pain of uncertain etiology 12/05/2021   Chronic atrial fibrillation, unspecified (HCC) 01/16/2014   Last Assessment & Plan:   Formatting of this note might be different from the original. -Home meds: Sotalol 80 mg twice daily, Xarelto 20 mg daily -Sotalol dose had to be temporarily reduced due to slight decrease in renal functions however it was returned to her home dose.  -Heart rate mostly in the 50s, QTC on EKG 11/30/18  478 msec -Xarelto had to be held on admission for insertion of chest tube    Chronic back pain 11/25/2018   Last Assessment & Plan:  Formatting of this note might be different from the original. - Continue home pain medication  - Added miralax daily prn   Chronic pain syndrome 12/03/2021   Constipation 12/03/2018   Last Assessment & Plan:  Formatting of this note might be different from the original. 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 - History of hiatal hernia status post fundoplication in 2005.  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  History of multiple EGDs for dilatation. - S/p thoracotomy with partial gastrectomy/esophagectomy in 2008 with thoracic esophagogastric    Hypomagnesemia 12/01/2018   Last Assessment & Plan:  Formatting of this note might be different from the original.  Replaced during hospitalization.   Hypothyroidism 01/16/2014   Last Assessment & Plan:  Formatting of this note might be different from the original. Continue levothyroxine 88 mcg   Irritable bowel syndrome 12/03/2021   Mild recurrent  major depression (HCC) 12/03/2021   Mixed hyperlipidemia 12/03/2021   OSA (obstructive sleep apnea) 04/16/2021   Other long term (current) drug therapy 12/03/2021   Other vitamin B12 deficiency anemias 12/03/2021   Pain in left hip 12/03/2021   Pain in right foot 12/03/2021   Pain in right knee 12/03/2021   Primary generalized (osteo)arthritis 12/03/2021   Primary osteoarthritis, right ankle and foot 12/03/2021   Recurrent major depression in full remission (HCC) 12/03/2021   Tinea unguium 12/03/2021   Type 2 diabetes mellitus without complications (HCC) 12/03/2021   Unspecified severe protein-calorie malnutrition (HCC) 12/03/2021   Urinary tract infectious disease 12/03/2021   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 She has never been exposed to tobacco smoke. She has never used smokeless tobacco. She reports that she does not currently use alcohol. No history on file for drug use.  Allergies  Allergen Reactions   Codeine Anaphylaxis    Other reaction(s): Unknown Can take morphine per patient; also tolerates hydromorphone    Linezolid     Other reaction(s): Angioedema (ALLERGY/intolerance)   Sulfa Antibiotics Itching    Family History  Problem Relation Age of Onset   Diabetes Mother    Cancer Mother    Heart disease Father    Diabetes Father    Hypertension Father     Prior to Admission medications   Medication Sig Start Date End Date Taking? Authorizing Provider  amLODipine (NORVASC) 5 MG tablet Take 1 tablet (5 mg total) by mouth daily. 06/07/23 06/06/24 Yes Wouk, Wilfred Curtis, MD  cyanocobalamin 1000 MCG tablet Take 1,000 mcg by mouth daily.   Yes [provider]  famotidine (PEPCID) 40 MG tablet Take 40 mg by mouth at bedtime. 10/27/21  Yes [provider]  levothyroxine (SYNTHROID) 88 MCG  tablet Take 88 mcg by mouth every morning. 11/05/21  Yes [provider]  meclizine (ANTIVERT) 25 MG tablet Take 25 mg by mouth 3 (three) times daily as needed for dizziness.   Yes [provider]  metoCLOPramide (REGLAN) 10 MG tablet Take 10 mg by mouth 2 (two) times daily before a meal.   Yes [provider]  nitrofurantoin, macrocrystal-monohydrate, (MACROBID) 100 MG capsule Take 1 capsule (100 mg total) by mouth daily. 06/01/23  Yes Sondra Come, MD  omeprazole (PRILOSEC) 40 MG capsule Take 40 mg by mouth 2 (two) times daily. 11/02/21  Yes [provider]  oxyCODONE-acetaminophen (PERCOCET) 10-325 MG tablet Take 1 tablet by mouth 4 (four) times daily as needed for pain. 11/21/21  Yes [provider]  sertraline (ZOLOFT) 100 MG tablet Take 100 mg by mouth daily. 10/16/21  Yes [provider]  sotalol (BETAPACE) 80 MG tablet Take 80 mg by mouth every 12 (twelve) hours. 11/01/21  Yes [provider]  sucralfate (CARAFATE) 1 g tablet Take 1 g by mouth 4 (four) times daily. 11/13/21  Yes [provider]  VOQUEZNA 20 MG TABS Take 1 tablet by mouth daily. 07/06/23  Yes [provider]  aminophylline injection Inject 3 mLs (75 mg total) into the  vein once for 1 dose. 06/07/23 06/07/23  Furth, Cadence H, PA-C  aspirin EC 81 MG tablet Take 81 mg by mouth daily. Patient not taking: Reported on 07/14/2023    [provider]  estradiol (ESTRACE) 0.1 MG/GM vaginal cream Estrogen Cream Instruction Discard applicator Apply pea sized amount to tip of finger to urethra before bed. Wash hands well after application. Use Monday, Wednesday and Friday Patient not taking: Reported on 07/14/2023 06/01/23   Sondra Come, MD  simvastatin (ZOCOR) 20 MG tablet Take 20 mg by mouth every evening. 10/18/21   [provider]    Physical Exam: Vitals:   07/14/23 0520 07/14/23 0530 07/14/23 0600 07/14/23 0630  BP: 121/64 118/65  116/69 110/65  Pulse: 80 79 81 78  Resp: 17     Temp: 98.2 F (36.8 C)     TempSrc:      SpO2: 96% 95% 97% 97%  Weight:      Height:       Physical Exam Constitutional:      Appearance: She is normal weight.     Comments: Pt sleeping and hard of hearing    HENT:     Head: Normocephalic and atraumatic.     Nose: Nose normal.     Mouth/Throat:     Mouth: Mucous membranes are dry.  Eyes:     Pupils: Pupils are equal, round, and reactive to light.  Cardiovascular:     Rate and Rhythm: Normal rate and regular rhythm.  Pulmonary:     Effort: Pulmonary effort is normal.  Abdominal:     General: Bowel sounds are normal.  Skin:    General: Skin is warm and dry.  Neurological:     General: No focal deficit present.  Psychiatric:        Mood and Affect: Mood normal.     Data Reviewed:  There are no new results to review at this time.  DG Abd Portable 1 View CLINICAL DATA:  NG tube insertion  EXAM: PORTABLE ABDOMEN - 1 VIEW  COMPARISON:  07/14/2023  FINDINGS: Interval placement of a nasogastric tube. The tip and side port are well below the level of the hemidiaphragms. Tip is in the right upper quadrant of the abdomen in the expected location of the proximal duodenum. Cholecystectomy clips noted in the right upper quadrant. Contrast material noted within the collecting systems of both kidneys.  IMPRESSION: Nasogastric tube tip and side port are well below the level of the hemidiaphragms.  Electronically Signed   By: Signa Kell M.D.   On: 07/14/2023 05:10 CT ABDOMEN PELVIS W CONTRAST CLINICAL DATA:  Acute nonlocalized abdominal pain. Nausea. Last bowel movement was 3 days ago.  EXAM: CT ABDOMEN AND PELVIS WITH CONTRAST  TECHNIQUE: Multidetector CT imaging of the abdomen and pelvis was performed using the standard protocol following bolus administration of intravenous contrast.  RADIATION DOSE REDUCTION: This exam was performed according to  the departmental dose-optimization program which includes automated exposure control, adjustment of the mA and/or kV according to patient size and/or use of iterative reconstruction technique.  CONTRAST:  75mL OMNIPAQUE IOHEXOL 300 MG/ML  SOLN  COMPARISON:  CT pelvis 04/27/2021.  CT abdomen and pelvis 07/19/2019  FINDINGS: Lower chest: Emphysematous changes, scarring, and bronchiectasis in the lung bases. Postoperative changes with esophagectomy and gastric pull-through procedure. Dilated fluid-filled esophagogastric structure similar to prior studies. No change since prior study.  Hepatobiliary: No focal liver abnormality is seen. Status post cholecystectomy. No biliary dilatation.  Pancreas: Unremarkable. No pancreatic ductal dilatation or surrounding inflammatory changes.  Spleen: Normal in size without focal abnormality.  Adrenals/Urinary Tract: Adrenal glands are unremarkable. Kidneys are normal, without renal calculi, focal lesion, or hydronephrosis. Bladder is unremarkable.  Stomach/Bowel: Postoperative changes with esophagectomy and gastric pull-through. Diffusely fluid-filled distended small bowel. No discrete wall thickening. Terminal ileum is decompressed with transition zone in the right lower quadrant. Changes are likely to represent small bowel obstruction. Colon is partially stool filled but mostly decompressed. Colonic diverticulosis without evidence of acute diverticulitis. Appendix is not visualized.  Vascular/Lymphatic: No significant vascular findings are present. No enlarged abdominal or pelvic lymph nodes.  Reproductive: Status post hysterectomy. No adnexal masses.  Other: Small amount of free fluid around the liver, likely ascites. No free air.  Musculoskeletal: Previous left hip arthroplasty. Degenerative changes in the spine and right hip. No acute bony abnormalities.  IMPRESSION: 1. Diffusely dilated fluid-filled small bowel with transition  zone in the right lower quadrant consistent with small-bowel obstruction. 2. Postoperative changes with esophagectomy and gastric pull-through procedure, unchanged. 3. Emphysematous changes, scarring, and bronchiectasis in the lung bases. 4. Small amount of free fluid around the liver.  Electronically Signed   By: Burman Nieves M.D.   On: 07/14/2023 02:21  Lab Results  Component Value Date   WBC 12.0 (H) 07/13/2023   HGB 12.7 07/13/2023   HCT 42.0 07/13/2023   MCV 83.0 07/13/2023   PLT 284 07/13/2023   Last metabolic panel Lab Results  Component Value Date   GLUCOSE 187 (H) 07/13/2023   NA 132 (L) 07/13/2023   K 4.0 07/13/2023   CL 94 (L) 07/13/2023   CO2 28 07/13/2023   BUN 14 07/13/2023   CREATININE 0.89 07/13/2023   GFRNONAA >60 07/13/2023   CALCIUM 8.7 (L) 07/13/2023   PROT 6.9 06/06/2023   ALBUMIN 3.5 06/06/2023   BILITOT 0.6 06/06/2023   ALKPHOS 84 06/06/2023   AST 37 06/06/2023   ALT 13 06/06/2023   ANIONGAP 10 07/13/2023    Assessment and Plan: * SBO (small bowel obstruction) (HCC) Recurrent nausea with noted SBO on CT imaging  Noted prior GI/surgical history including esophagectomy and gastric pull through in 2009, exploratory laparotomy and LOA in 2015, as well as appendectomy and cholecystectomy  NG tube placed at the bedside  Case discussed w/ Dr. Aleen Campi overnight  Follow up general surgery recommendations  Follow    Type 2 diabetes mellitus without complications (HCC) Blood sugar in 180s  Very sensitive SSI in setting of SBO and NPO status  A1C   Essential hypertension BP stable  Titrate BP regimen    Esophageal dysphagia Noted prior history of esophagectomy and multiple esophageal dilatations in the past + concurrent SBO s/p NGT placement  Followed by [facility]  Monitor    Chronic atrial fibrillation, unspecified (HCC) Rate controlled at present  Not on [facility]  Monitor  Chest pain Noted admission 05/2023 for chest pain Had  normal 2D ECHO and stress test  No reported CP at present  Trop in 30s (50s during last admission) Monitor     Greater than 50% was spent in counseling and coordination of care with patient Total encounter time 80 minutes or more     Advance Care Planning:   Code Status: Full Code   Consults: General Surgery- [place]   Family Communication: Discussed plan of care w/ daughter [name] over the phone   Severity of Illness: The appropriate patient status for this patient is INPATIENT.  Inpatient status is judged to be reasonable and necessary in order to provide the required intensity of service to ensure the patient's safety. The patient's presenting symptoms, physical exam findings, and initial radiographic and laboratory data in the context of their chronic comorbidities is felt to place them at high risk for further clinical deterioration. Furthermore, it is not anticipated that the patient will be medically stable for discharge from the hospital within 2 midnights of admission.   * I certify that at the point of admission it is my clinical judgment that the patient will require inpatient hospital care spanning beyond 2 midnights from the point of admission due to high intensity of service, high risk for further deterioration and high frequency of surveillance required.*  Author: Floydene Flock, MD 07/14/2023 7:43 AM  For on call review [email or web address].

## 2023-07-14 NOTE — ED Notes (Signed)
Per Dr. Dolores Frame, NG tube repositioned to 45 cm mark. Tube was advanced too far for pt's current anatomy.

## 2023-07-14 NOTE — ED Notes (Signed)
Pt gone to CT 

## 2023-07-14 NOTE — ED Notes (Signed)
Rn spoke with floor. Room ready for transport.

## 2023-07-14 NOTE — Assessment & Plan Note (Addendum)
Blood sugar in 180s  Very sensitive SSI in setting of SBO and NPO status  A1C

## 2023-07-14 NOTE — ED Provider Notes (Signed)
-----------------------------------------   1:23 AM on 07/14/2023 -----------------------------------------   Awaiting CT scan results.  Patient asleep.  I personally reviewed her records and see that she is status post ex-lap and enterolysis for internal hernia on 01/10/2014. She is s/p esophagectomy in 2009 for benign disease.   ----------------------------------------- 3:11 AM on 07/14/2023 -----------------------------------------   CT scan demonstrates SBO.  Discussed with surgeon on-call Dr. Aleen Campi who reviewed patient's CT images from home.  Agrees with NG tube.  Since patient has had esophagectomy, NG tube will not need to be advanced too far.  Will consult hospitalist services for evaluation and admission.  ----------------------------------------- 5:42 AM on 07/14/2023 ----------------------------------------- .  KUB demonstrates NG tube and right upper quadrant; will retract 6cm and repeat KUB.  ----------------------------------------- 05:59 AM on 07/14/2023 -----------------------------------------   Reviewed repeat KUB after retracting NG tube 6 cm; still towards the right.  Obtained additional gastric contents.  Will recheck another 6 cm and repeat x-ray.  ----------------------------------------- 6:07 AM on 07/14/2023 -----------------------------------------   Improved NGT position.     Irean Hong, MD 07/14/23 [phone]

## 2023-07-14 NOTE — ED Notes (Signed)
Called pt's daughter and gave her an update per Dr. Dolores Frame; the surgeon recommended we insert a NG tube. Per pt's daughter she is uncomfortable with this because pt has a history of esophageal strictures. Dr Dolores Frame notified.

## 2023-07-14 NOTE — ED Notes (Signed)
Pt placed on 2L [place] due to oxygen saturation of 89%. MD notified. Pt's O2 saturation increased to 94%.

## 2023-07-14 NOTE — Assessment & Plan Note (Signed)
Noted admission 05/2023 for chest pain Had normal 2D ECHO and stress test  No reported CP at present  Trop in 30s (50s during last admission) Monitor

## 2023-07-14 NOTE — ED Notes (Signed)
Pt reports pain in left side when breathing. Kristy Morin, MD notified.

## 2023-07-14 NOTE — ED Notes (Addendum)
Dr. Alvester Morin notified of pt increased troponin, continue to trend troponin per Dr. Alvester Morin. No new orders at this time

## 2023-07-14 NOTE — Progress Notes (Addendum)
+   L sided pain w/ deep breathing  Otherwise stable  Will EKG, trop x 1 D dimer x 1 Consider general surgery bedside eval if sxs worsen  Otherwise follow closely

## 2023-07-14 NOTE — Assessment & Plan Note (Signed)
Rate controlled at present  Not on [facility]  Monitor

## 2023-07-14 NOTE — Consult Note (Signed)
[facility] SURGICAL CONSULTATION NOTE (initial) - cpt: 40981   HISTORY OF PRESENT ILLNESS (HPI):  82 y.o. female presented to [facility] ED yesterday for evaluation of abdominal pain. Patient reports lower abdominal pain over the course of the last 48 hours. This has been accompanied with nausea and emesis. No fever, chills, cough, CP, SOB, bowel changes. She does report a history of small bowel obstructions previously and this is similar. She does have a complex surgical history including esophagectomy and gastric pull through in 2009, exploratory laparotomy and LOA in 2015, as well as appendectomy and cholecystectomy. Work up in the ED revealed a mild leukocytosis to 12.0K, Hgb to 12.7, sCr - 0.89, mild hyponatremia to 132. She did undergo CT Abdomen/Pelvis which was concerning for small bowel obstruction.   Surgery is consulted by emergency medicine physician Dr. Chiquita Loth, MD in this context for evaluation and management of SBO.  PAST MEDICAL HISTORY (PMH):  Past Medical History:  Diagnosis Date   Age-related osteoporosis without current pathological fracture 12/03/2021   Chest pain    Chest pain of uncertain etiology 12/05/2021   Chronic atrial fibrillation, unspecified (HCC) 01/16/2014   Last Assessment & Plan:  Formatting of this note might be different from the original. -Home meds: Sotalol 80 mg twice daily, Xarelto 20 mg daily -Sotalol dose had to be temporarily reduced due to slight decrease in renal functions however it was returned to her home dose.  -Heart rate mostly in the 50s, QTC on EKG 11/30/18  478 msec -Xarelto had to be held on admission for insertion of chest tube    Chronic back pain 11/25/2018   Last Assessment & Plan:  Formatting of this note might be different from the original. - Continue home pain medication  - Added miralax daily prn   Chronic pain syndrome 12/03/2021   Constipation 12/03/2018   Last Assessment & Plan:  Formatting of this note might be different  from the original. 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 - History of hiatal hernia status post fundoplication in 2005.  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  History of multiple EGDs for dilatation. - S/p thoracotomy with partial gastrectomy/esophagectomy in 2008 with thoracic esophagogastric    Hypomagnesemia 12/01/2018   Last Assessment & Plan:  Formatting of this note might be different from the original. Replaced during hospitalization.   Hypothyroidism 01/16/2014   Last Assessment & Plan:  Formatting of this note might be different from the original. 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   Mixed hyperlipidemia 12/03/2021   OSA (obstructive sleep apnea) 04/16/2021   Other long term (current) drug therapy 12/03/2021   Other vitamin B12 deficiency anemias 12/03/2021   Pain in left hip 12/03/2021   Pain in right foot 12/03/2021   Pain in right knee 12/03/2021   Primary generalized (osteo)arthritis 12/03/2021   Primary osteoarthritis, right ankle and foot  12/03/2021   Recurrent major depression in full remission (HCC) 12/03/2021   Tinea unguium 12/03/2021   Type 2 diabetes mellitus without complications (HCC) 12/03/2021   Unspecified severe protein-calorie malnutrition (HCC) 12/03/2021   Urinary tract infectious disease 12/03/2021    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 Authorizing Provider  amLODipine (NORVASC) 5 MG tablet Take 1 tablet (5 mg total) by mouth daily. 06/07/23 06/06/24 Yes Wouk, Wilfred Curtis, MD  cyanocobalamin 1000 MCG tablet Take 1,000 mcg by mouth daily.   Yes [provider]  famotidine (PEPCID) 40 MG tablet Take 40 mg by mouth at bedtime. 10/27/21  Yes [provider]  levothyroxine (SYNTHROID) 88 MCG tablet Take 88 mcg by mouth every morning. 11/05/21  Yes [provider]  meclizine (ANTIVERT) 25 MG tablet Take 25 mg by mouth 3 (three) times daily as needed for dizziness.   Yes [provider]  metoCLOPramide (REGLAN) 10 MG tablet Take 10 mg by mouth 2 (two) times daily before a meal.   Yes [provider]  nitrofurantoin, macrocrystal-monohydrate, (MACROBID) 100 MG capsule Take 1 capsule (100 mg total) by mouth daily. 06/01/23  Yes Sondra Come, MD  omeprazole (PRILOSEC) 40 MG capsule Take 40 mg by mouth 2 (two) times daily. 11/02/21  Yes [provider]  oxyCODONE-acetaminophen (PERCOCET) 10-325 MG tablet Take 1 tablet by mouth 4 (four) times daily as needed for pain. 11/21/21  Yes [provider]  sertraline (ZOLOFT) 100 MG tablet Take 100 mg by mouth daily. 10/16/21  Yes [provider]  sotalol (BETAPACE) 80 MG tablet Take 80 mg by mouth every 12 (twelve) hours. 11/01/21  Yes [provider]  sucralfate (CARAFATE) 1 g tablet Take 1 g by mouth 4 (four) times daily. 11/13/21  Yes [provider]  VOQUEZNA 20 MG TABS Take 1 tablet by mouth daily. 07/06/23  Yes [provider]  aminophylline injection Inject 3 mLs (75 mg total) into the vein once for 1 dose. 06/07/23  06/07/23  Furth, Cadence H, PA-C  aspirin EC 81 MG tablet Take 81 mg by mouth daily. Patient not taking: Reported on 07/14/2023    [provider]  estradiol (ESTRACE) 0.1 MG/GM vaginal cream Estrogen Cream Instruction Discard applicator Apply pea sized amount to tip of finger to urethra before bed. Wash hands well after application. Use Monday, Wednesday and Friday Patient not taking: Reported on 07/14/2023 06/01/23   Sondra Come, MD  simvastatin (ZOCOR) 20 MG tablet Take 20 mg by mouth every evening. 10/18/21   [provider]     ALLERGIES:  Allergies  Allergen Reactions   Codeine Anaphylaxis    Other reaction(s): Unknown Can take morphine per patient; also tolerates hydromorphone    Linezolid     Other reaction(s): Angioedema (ALLERGY/intolerance)   Sulfa Antibiotics Itching     SOCIAL HISTORY:  Social History   Socioeconomic History   Marital status: Divorced    Spouse name: Not on file   Number of children: Not on file   Years of education: Not on file   Highest education level: Not on file  Occupational History   Not on file  Tobacco Use   Smoking status: Never  Passive exposure: Never   Smokeless tobacco: Never  Substance and Sexual Activity   Alcohol use: Not Currently   Drug use: Not on file   Sexual activity: Not on file  Other Topics Concern   Not on file  Social History Narrative   Not on file   Social Determinants of Health   Financial Resource Strain: Not on file  Food Insecurity: Not on file  Transportation Needs: Not on file  Physical Activity: Not on file  Stress: Not on file  Social Connections: Not on file  Intimate Partner Violence: Not on file     FAMILY HISTORY:  Family History  Problem Relation Age of Onset   Diabetes Mother    Cancer Mother    Heart disease Father    Diabetes Father    Hypertension Father       REVIEW OF SYSTEMS:  Review of Systems  Constitutional:  Negative for chills and fever.   HENT:  Negative for congestion and sore throat.   Respiratory:  Negative for cough and shortness of breath.   Cardiovascular:  Negative for chest pain and palpitations.  Gastrointestinal:  Positive for abdominal pain, nausea and vomiting. Negative for constipation and diarrhea.  Genitourinary:  Negative for dysuria and urgency.  All other systems reviewed and are negative.   VITAL SIGNS:  Temp:  [97.5 F (36.4 C)-98.2 F (36.8 C)] 98.2 F (36.8 C) (10/23 0520) Pulse Rate:  [72-97] 78 (10/23 0630) Resp:  [16-20] 17 (10/23 0520) BP: (103-146)/(56-74) 110/65 (10/23 0630) SpO2:  [95 %-98 %] 97 % (10/23 0630) Weight:  [46 kg] 46 kg (10/22 1956)     Height: 5\' 2"  (157.5 cm) Weight: 46 kg BMI (Calculated): 18.54   INTAKE/OUTPUT:  10/22 0701 - 10/23 0700 In: 30 [NG/GT:30] Out: 250 [Emesis/NG output:250]  PHYSICAL EXAM:  Physical Exam Vitals and nursing note reviewed. Exam conducted with a chaperone present.  Constitutional:      General: She is not in acute distress.    Appearance: She is well-developed and normal weight. She is not ill-appearing.     Comments: Patient resting in bed; NAD  HENT:     Head: Normocephalic and atraumatic.     Right Ear: Decreased hearing noted.     Left Ear: Decreased hearing noted.     Nose:     Comments: NGT in place; 45 cm at nare Eyes:     General: No scleral icterus.    Extraocular Movements: Extraocular movements intact.     Comments: Wearing glasses  Cardiovascular:     Rate and Rhythm: Normal rate.     Heart sounds: Normal heart sounds.  Pulmonary:     Effort: Pulmonary effort is normal. No respiratory distress.  Abdominal:     General: Abdomen is flat. A surgical scar is present. There is no distension.     Palpations: Abdomen is soft.     Tenderness: There is abdominal tenderness in the right lower quadrant and suprapubic area. There is no guarding or rebound.     Comments: Abdomen is soft, she is not markedly distended, she is  tender in the RLQ and suprapubic region, no rebound/guarding. She is not peritonitic. Noted previous surgical scars   Genitourinary:    Comments: Deferred Skin:    General: Skin is warm and dry.     Findings: No erythema.  Neurological:     General: No focal deficit present.     Mental Status: She is alert and oriented to person,  place, and time.  Psychiatric:        Mood and Affect: Mood normal.        Behavior: Behavior normal.      Labs:     Latest Ref Rng & Units 07/13/2023    8:03 PM 06/06/2023    2:14 PM 03/30/2023   12:13 PM  CBC  WBC 4.0 - 10.5 K/uL 12.0  6.5  4.9   Hemoglobin 12.0 - 15.0 g/dL 81.1  91.4  78.2   Hematocrit 36.0 - 46.0 % 42.0  37.9  32.7   Platelets 150 - 400 K/uL 284  253  209       Latest Ref Rng & Units 07/13/2023   11:20 PM 06/07/2023    6:29 AM 06/06/2023    4:40 PM  CMP  Glucose 70 - 99 mg/dL 956  83    BUN 8 - 23 mg/dL 14  10    Creatinine 2.13 - 1.00 mg/dL 0.86  5.78    Sodium 469 - 145 mmol/L 132  140    Potassium 3.5 - 5.1 mmol/L 4.0  3.4    Chloride 98 - 111 mmol/L 94  102    CO2 22 - 32 mmol/L 28  31    Calcium 8.9 - 10.3 mg/dL 8.7  8.2    Total Protein 6.5 - 8.1 g/dL   6.9   Total Bilirubin 0.3 - 1.2 mg/dL   0.6   Alkaline Phos 38 - 126 U/L   84   AST 15 - 41 U/L   37   ALT 0 - 44 U/L   13     Imaging studies:   CT Abdomen/Pelvis (07/14/2023) personally reviewed with noted post-surgical changes to esophagus/stomach, dilated loops of small bowel, no free air, no pneumatosis, and radiologist report reviewed below: IMPRESSION: 1. Diffusely dilated fluid-filled small bowel with transition zone in the right lower quadrant consistent with small-bowel obstruction. 2. Postoperative changes with esophagectomy and gastric pull-through procedure, unchanged. 3. Emphysematous changes, scarring, and bronchiectasis in the lung bases. 4. Small amount of free fluid around the liver.    KUB (10/23 - 0600) personally reviewed with more  adequate positioning of NGT given patients post-surgical anatomy, and radiologist report pending...   Assessment/Plan: (ICD-10's: K16.609) 82 y.o. 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   - Appreciate medicine admission - Agree with NGT decompression; LIS; monitor and record output - recent KUB shows adequate positioning, she has history of esophagectomy and gastric pull through, so stomach body is in chest - No emergent surgical intervention. We will attempt to manage this conservatively especially given her surgical history. She understands that should she fail to improve, or clinically deteriorate, we will have to consider more urgent intervention - NPO for now; IVF support - Monitor abdominal examination; on-going bowel function - Serial KUBs as needed - Pain control prn; antiemetics prn   - Mobilize   - Further management per primary service; we will follow    All of the above findings and recommendations were discussed with the patient, and all of patient's questions were answered to her expressed satisfaction.  Thank you for the opportunity to participate in this patient's care.   -- Lynden Oxford, PA-C [facility] 07/14/2023, 7:33 AM M-F: 7am - 4pm

## 2023-07-14 NOTE — Assessment & Plan Note (Signed)
Noted prior history of esophagectomy and multiple esophageal dilatations in the past + concurrent SBO s/p NGT placement  Followed by [facility]  Monitor

## 2023-07-14 NOTE — ED Notes (Signed)
EKG given to ED provider. Alvester Morin, MD messaged of EKG completion.

## 2023-07-15 ENCOUNTER — Inpatient Hospital Stay: Payer: Medicare HMO

## 2023-07-15 DIAGNOSIS — K56609 Unspecified intestinal obstruction, unspecified as to partial versus complete obstruction: Secondary | ICD-10-CM | POA: Diagnosis not present

## 2023-07-15 DIAGNOSIS — E43 Unspecified severe protein-calorie malnutrition: Secondary | ICD-10-CM

## 2023-07-15 DIAGNOSIS — Z515 Encounter for palliative care: Secondary | ICD-10-CM

## 2023-07-15 DIAGNOSIS — R1319 Other dysphagia: Secondary | ICD-10-CM | POA: Diagnosis not present

## 2023-07-15 DIAGNOSIS — I482 Chronic atrial fibrillation, unspecified: Secondary | ICD-10-CM | POA: Diagnosis not present

## 2023-07-15 DIAGNOSIS — E119 Type 2 diabetes mellitus without complications: Secondary | ICD-10-CM | POA: Diagnosis not present

## 2023-07-15 HISTORY — DX: Unspecified severe protein-calorie malnutrition: E43

## 2023-07-15 LAB — COMPREHENSIVE METABOLIC PANEL
ALT: 10 U/L (ref 0–44)
AST: 20 U/L (ref 15–41)
Albumin: 2.9 g/dL — ABNORMAL LOW (ref 3.5–5.0)
Alkaline Phosphatase: 61 U/L (ref 38–126)
Anion gap: 7 (ref 5–15)
BUN: 24 mg/dL — ABNORMAL HIGH (ref 8–23)
CO2: 27 mmol/L (ref 22–32)
Calcium: 7.6 mg/dL — ABNORMAL LOW (ref 8.9–10.3)
Chloride: 98 mmol/L (ref 98–111)
Creatinine, Ser: 1.11 mg/dL — ABNORMAL HIGH (ref 0.44–1.00)
GFR, Estimated: 50 mL/min — ABNORMAL LOW (ref >60)
Glucose, Bld: 118 mg/dL — ABNORMAL HIGH (ref 70–99)
Potassium: 3.5 mmol/L (ref 3.5–5.1)
Sodium: 132 mmol/L — ABNORMAL LOW (ref 135–145)
Total Bilirubin: 0.6 mg/dL (ref 0.3–1.2)
Total Protein: 5.8 g/dL — ABNORMAL LOW (ref 6.5–8.1)

## 2023-07-15 LAB — CBC
HCT: 33.8 % — ABNORMAL LOW (ref 36.0–46.0)
Hemoglobin: 11 g/dL — ABNORMAL LOW (ref 12.0–15.0)
MCH: 25.8 pg — ABNORMAL LOW (ref 26.0–34.0)
MCHC: 32.5 g/dL (ref 30.0–36.0)
MCV: 79.2 fL — ABNORMAL LOW (ref 80.0–100.0)
Platelets: 251 10*3/uL (ref 150–400)
RBC: 4.27 MIL/uL (ref 3.87–5.11)
RDW: 16.6 % — ABNORMAL HIGH (ref 11.5–15.5)
WBC: 15.6 10*3/uL — ABNORMAL HIGH (ref 4.0–10.5)
nRBC: 0 % (ref 0.0–0.2)

## 2023-07-15 LAB — GLUCOSE, CAPILLARY
Glucose-Capillary: 100 mg/dL — ABNORMAL HIGH (ref 70–99)
Glucose-Capillary: 106 mg/dL — ABNORMAL HIGH (ref 70–99)
Glucose-Capillary: 115 mg/dL — ABNORMAL HIGH (ref 70–99)
Glucose-Capillary: 117 mg/dL — ABNORMAL HIGH (ref 70–99)
Glucose-Capillary: 125 mg/dL — ABNORMAL HIGH (ref 70–99)
Glucose-Capillary: 96 mg/dL (ref 70–99)

## 2023-07-15 LAB — TROPONIN I (HIGH SENSITIVITY): Troponin I (High Sensitivity): 123 ng/L (ref <18)

## 2023-07-15 MED ORDER — ENOXAPARIN SODIUM 30 MG/0.3ML IJ SOSY
30.0000 mg | PREFILLED_SYRINGE | INTRAMUSCULAR | Status: DC
  Administered 2023-07-15: 30 mg via SUBCUTANEOUS
  Filled 2023-07-15: qty 0.3

## 2023-07-15 MED ORDER — DEXTROSE-SODIUM CHLORIDE 5-0.45 % IV SOLN
INTRAVENOUS | Status: AC
Start: 2023-07-15 — End: 2023-07-16

## 2023-07-15 MED ORDER — SERTRALINE HCL 50 MG PO TABS
100.0000 mg | ORAL_TABLET | Freq: Every day | ORAL | Status: DC
Start: 2023-07-15 — End: 2023-07-20
  Administered 2023-07-15 – 2023-07-19 (×5): 100 mg
  Filled 2023-07-15 (×6): qty 2

## 2023-07-15 MED ORDER — LEVOTHYROXINE SODIUM 88 MCG PO TABS
88.0000 ug | ORAL_TABLET | Freq: Every morning | ORAL | Status: DC
  Administered 2023-07-16 – 2023-07-19 (×4): 88 ug
  Filled 2023-07-15 (×4): qty 1

## 2023-07-15 MED ORDER — NITROFURANTOIN MONOHYD MACRO 100 MG PO CAPS
100.0000 mg | ORAL_CAPSULE | Freq: Every day | ORAL | Status: DC
  Administered 2023-07-15 – 2023-07-17 (×3): 100 mg
  Filled 2023-07-15 (×4): qty 1

## 2023-07-15 NOTE — Progress Notes (Signed)
Initial Nutrition Assessment  DOCUMENTATION CODES:   Severe malnutrition in context of chronic illness  INTERVENTION:   -RD will follow for diet advancement and add supplements as appropriate   NUTRITION DIAGNOSIS:   Severe Malnutrition related to chronic illness (esophageal dysphagia and stricture s/p esophagectomy) as evidenced by moderate fat depletion, severe fat depletion, moderate muscle depletion, severe muscle depletion.  GOAL:   Patient will meet greater than or equal to 90% of their needs  MONITOR:   Diet advancement  REASON FOR ASSESSMENT:   Malnutrition Screening Tool    ASSESSMENT:   Pt with medical history significant of esophageal dysphagia and stricture status post esophagectomy as well as multiple esophageal dilatations in the past, atrial fibrillation on anticoagulation, hypertension, hypothyroidism, GERD, type 2 diabetes, multiple abdominal surgeries presenting with small bowel obstruction.  10/23- s/p NGT placement for decompression; CT reveals placement in the abdomen  Reviewed I/O's: -100 ml x 24 hours and -320 ml x 24 hours  NGT output: 150 ml x 24 hours   Case discussed with RN. NGT currently connected to low, intermittent suction. Pt NPO.   Spoke with pt at bedside, who was pleasant and in good spirits. She complains of some stomach pain, but pain medications have been helping. Pt shares that she has had decreased oral intake over the past 2 weeks secondary to abdominal pain. She reports her intake is usually fair at baseline, consuming 2 meals and snacks. Pt consumes softer textured foods and soups secondary to esophageal stricture.  Reviewed wt hx; no wt loss noted over the past 8 months. Pt reports ongoing weight loss over the past several years ("I used to weigh 200#") secondary to esophageal stricture and other medical problems.   Pt understanding of NPO status. Per general surgery notes, no plan for emergent surgical intervention. Pt shares  that she has had SBO before.   Medications reviewed.   Lab Results  Component Value Date   HGBA1C 5.8 (H) 06/07/2023   PTA DM medications are none.   Labs reviewed: Na: 132, CBGS: 100-125 (inpatient orders for glycemic control are 0-9 units insulin aspart every 4 hours).    NUTRITION - FOCUSED PHYSICAL EXAM:  Flowsheet Row Most Recent Value  Orbital Region Severe depletion  Upper Arm Region Severe depletion  Thoracic and Lumbar Region Moderate depletion  Buccal Region Moderate depletion  Temple Region Severe depletion  Clavicle Bone Region Severe depletion  Clavicle and Acromion Bone Region Severe depletion  Scapular Bone Region Severe depletion  Dorsal Hand Moderate depletion  Patellar Region Severe depletion  Anterior Thigh Region Severe depletion  Posterior Calf Region Severe depletion  Edema (RD Assessment) None  Hair Reviewed  Eyes Reviewed  Mouth Reviewed  Skin Reviewed  Nails Reviewed       Diet Order:   Diet Order             Diet NPO time specified  Diet effective now                   EDUCATION NEEDS:   Education needs have been addressed  Skin:  Skin Assessment: Skin Integrity Issues: Skin Integrity Issues:: Other (Comment) Other: pressure injury to coccyx  Last BM:  Unknown  Height:   Ht Readings from Last 1 Encounters:  07/13/23 5\' 2"  (1.575 m)    Weight:   Wt Readings from Last 1 Encounters:  07/13/23 46 kg    Ideal Body Weight:  50 kg  BMI:  Body mass index  is 18.55 kg/m.  Estimated Nutritional Needs:   Kcal:  1400-1600  Protein:  70-85 grams  Fluid:  > 1.4 L    Levada Schilling, RD, LDN, CDCES Registered Dietitian III Certified Diabetes Care and Education Specialist Please refer to [facility] for RD and/or RD on-call/weekend/after hours pager

## 2023-07-15 NOTE — Progress Notes (Signed)
1      PROGRESS NOTE    Kristy Archer  YNW:[id number] DOB: 31-Aug-1941 DOA: 07/13/2023 PCP: Hortencia Conradi, NP    Brief Narrative:   82 y.o. 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 as well as multiple esophageal dilatations in the past, atrial fibrillation on anticoagulation, hypertension, hypothyroidism, GERD, type 2 diabetes, multiple abdominal surgeries presenting with small bowel obstruction   10/24: Surgery seen and recommending conservative management   Assessment & Plan:   Principal Problem:   SBO (small bowel obstruction) (HCC) Active Problems:   Chest pain   Chronic atrial fibrillation, unspecified (HCC)   Esophageal dysphagia   Essential hypertension   Type 2 diabetes mellitus without complications (HCC)   Protein-calorie malnutrition, severe   * SBO (small bowel obstruction) (HCC) Recurrent nausea with noted SBO on CT imaging  Noted prior GI/surgical history including esophagectomy and gastric pull through in 2009, exploratory laparotomy and LOA in 2015, as well as appendectomy and cholecystectomy  NG tube in place with low intermittent suction. -Surgical team following.  Keep n.p.o. for now -Further management per surgical team  Type 2 diabetes mellitus without complications (HCC) Sliding scale for now   Essential hypertension BP stable  Monitor   Esophageal dysphagia Noted prior history of esophagectomy and multiple esophageal dilatations in the past + concurrent SBO s/p NGT placement  Followed by [facility]    Chronic atrial fibrillation, unspecified (HCC) Rate controlled at present  Not on [facility]    Chest pain Noted admission 05/2023 for chest pain Had normal 2D ECHO and stress test  No reported CP at present  Trop in 30s (50s during last admission) Likely noncardiac   Goals of care Will consult palliative care  DVT prophylaxis: (Lovenox enoxaparin (LOVENOX) injection 30 mg  Start: 07/15/23 2200     Code Status: (Full code Family Communication: None at bedside Disposition Plan: Possibly home with home health versus SNF depending on clinical improvement.  Possible in 3 to 4 days   Consultants:  Surgery    Subjective:  Patient is very hard of hearing but shares that her pain has improved.  She does not have much appetite  Objective: Vitals:   07/15/23 0214 07/15/23 0422 07/15/23 0742 07/15/23 1317  BP: (!) 97/58 (!) 99/54 (!) 99/57   Pulse: 90 83 73   Resp: 20 16 18 18   Temp: 98.4 F (36.9 C) 98.5 F (36.9 C) (!) 97.5 F (36.4 C)   TempSrc:  Oral    SpO2: 97% 96% 99%   Weight:      Height:        Intake/Output Summary (Last 24 hours) at 07/15/2023 1400 Last data filed at 07/15/2023 1340 Gross per 24 hour  Intake 840 ml  Output 150 ml  Net 690 ml   Filed Weights   07/13/23 1956  Weight: 46 kg    Examination:  General exam: Appears calm and comfortable.  She is hard of hearing Respiratory system: Clear to auscultation. Respiratory effort normal. Cardiovascular system: S1 & S2 heard, RRR. No JVD, murmurs, rubs, gallops or clicks. No pedal edema. Gastrointestinal system: Abdomen is tender in the right lower quadrant.  Mild distention.  No rebound or guarding.   Central nervous system: Alert and oriented. No focal neurological deficits. Extremities: Symmetric 5 x 5 power. Skin: No rashes, lesions or ulcers Psychiatry: Judgement and insight appear normal. Mood & affect appropriate.     Data Reviewed: I  have personally reviewed following labs and imaging studies  CBC: Recent Labs  Lab 07/13/23 2003 07/15/23 0246  WBC 12.0* 15.6*  HGB 12.7 11.0*  HCT 42.0 33.8*  MCV 83.0 79.2*  PLT 284 251   Basic Metabolic Panel: Recent Labs  Lab 07/13/23 2320 07/15/23 0246  NA 132* 132*  K 4.0 3.5  CL 94* 98  CO2 28 27  GLUCOSE 187* 118*  BUN 14 24*  CREATININE 0.89 1.11*  CALCIUM 8.7* 7.6*   GFR: Estimated Creatinine  Clearance: 28.9 mL/min (A) (by C-G formula based on SCr of 1.11 mg/dL (H)). Liver Function Tests: Recent Labs  Lab 07/15/23 0246  AST 20  ALT 10  ALKPHOS 61  BILITOT 0.6  PROT 5.8*  ALBUMIN 2.9*    CBG: Recent Labs  Lab 07/14/23 1940 07/15/23 0028 07/15/23 0421 07/15/23 0743 07/15/23 1130  GLUCAP 116* 125* 106* 100* 96      Radiology Studies: DG Abd 2 Views  Result Date: 07/15/2023 CLINICAL DATA:  82 year old female with suspected small bowel obstruction. EXAM: ABDOMEN - 2 VIEW COMPARISON:  CT Abdomen and Pelvis yesterday. FINDINGS: Portable AP upright and supine views at 0814 hours. Enteric tube terminates at the distal esophagus in the midline just above the diaphragm. External portion of the tube loops over the epigastrium. No pneumoperitoneum.  Stable lung bases. Persistent nephrograms, and excreted IV contrast in the urinary bladder. Stable cholecystectomy clips. Left pelvic, hypogastric embolization coils. Bowel-gas pattern has improved since the scout view yesterday, not completely normalized but largely nonobstructed now. Osteopenia. Stable visualized osseous structures. Left hip arthroplasty. IMPRESSION: 1. Enteric tube terminates at the distal esophagus. Advanced at least 12 cm to ensure side hole placement within the stomach. 2. Bowel-gas pattern has improved since yesterday, suggesting resolving small bowel obstruction. No pneumoperitoneum. 3. Persistent nephrogram, query renal insufficiency. Electronically Signed   By: Odessa Fleming M.D.   On: 07/15/2023 10:29   CT Angio Chest Pulmonary Embolism (PE) W or WO Contrast  Result Date: 07/14/2023 CLINICAL DATA:  Chest pain. EXAM: CT ANGIOGRAPHY CHEST WITH CONTRAST TECHNIQUE: Multidetector CT imaging of the chest was performed using the standard protocol during bolus administration of intravenous contrast. Multiplanar CT image reconstructions and MIPs were obtained to evaluate the vascular anatomy. RADIATION DOSE REDUCTION: This  exam was performed according to the departmental dose-optimization program which includes automated exposure control, adjustment of the mA and/or kV according to patient size and/or use of iterative reconstruction technique. CONTRAST:  75mL OMNIPAQUE IOHEXOL 350 MG/ML SOLN COMPARISON:  March 08, 2023. FINDINGS: Cardiovascular: Satisfactory opacification of the pulmonary arteries to the segmental level. No evidence of pulmonary embolism. Normal heart size. No pericardial effusion. Mediastinum/Nodes: Status post gastric pull-through and esophageal resection. No definite adenopathy is noted. Feeding tube is seen passing through chest into abdomen. Lungs/Pleura: No pneumothorax is noted. Mild bilateral posterior basilar subsegmental atelectasis or infiltrates are noted. Small right pleural effusion is noted. Upper Abdomen: No acute abnormality. Musculoskeletal: No chest wall abnormality. No acute or significant osseous findings. Review of the MIP images confirms the above findings. IMPRESSION: No definite evidence of pulmonary embolism. Status post esophagectomy and gastric pull-through. Mild bilateral posterior basilar subsegmental atelectasis or infiltrates are noted. Aortic Atherosclerosis (ICD10-I70.0). Electronically Signed   By: Lupita Raider M.D.   On: 07/14/2023 19:55   DG Abdomen 1 View  Result Date: 07/14/2023 CLINICAL DATA:  eval NGT placement. EXAM: ABDOMEN - 1 VIEW COMPARISON:  Abdominal radiograph 07/14/2023 at 0554 hours. FINDINGS: 0601 hours.  Endotracheal tube tip now projects over the lower esophagus, near the GE junction. IMPRESSION: Endotracheal tube tip now projects over the lower esophagus, near the GE junction. Recommend advancement. Electronically Signed   By: Orvan Falconer M.D.   On: 07/14/2023 08:29   DG Abdomen 1 View  Result Date: 07/14/2023 CLINICAL DATA:  Nasogastric tube placement EXAM: ABDOMEN - 1 VIEW COMPARISON:  Earlier film of the same day FINDINGS: Nasogastric tube into  the decompressed stomach, proximal side hole near the GE junction. No other interval change. IMPRESSION: Nasogastric tube into the decompressed stomach. Electronically Signed   By: Corlis Leak M.D.   On: 07/14/2023 08:13   DG Abd Portable 1 View  Result Date: 07/14/2023 CLINICAL DATA:  NG tube insertion EXAM: PORTABLE ABDOMEN - 1 VIEW COMPARISON:  07/14/2023 FINDINGS: Interval placement of a nasogastric tube. The tip and side port are well below the level of the hemidiaphragms. Tip is in the right upper quadrant of the abdomen in the expected location of the proximal duodenum. Cholecystectomy clips noted in the right upper quadrant. Contrast material noted within the collecting systems of both kidneys. IMPRESSION: Nasogastric tube tip and side port are well below the level of the hemidiaphragms. Electronically Signed   By: Signa Kell M.D.   On: 07/14/2023 05:10   CT ABDOMEN PELVIS W CONTRAST  Result Date: 07/14/2023 CLINICAL DATA:  Acute nonlocalized abdominal pain. Nausea. Last bowel movement was 3 days ago. EXAM: CT ABDOMEN AND PELVIS WITH CONTRAST TECHNIQUE: Multidetector CT imaging of the abdomen and pelvis was performed using the standard protocol following bolus administration of intravenous contrast. RADIATION DOSE REDUCTION: This exam was performed according to the departmental dose-optimization program which includes automated exposure control, adjustment of the mA and/or kV according to patient size and/or use of iterative reconstruction technique. CONTRAST:  75mL OMNIPAQUE IOHEXOL 300 MG/ML  SOLN COMPARISON:  CT pelvis 04/27/2021.  CT abdomen and pelvis 07/19/2019 FINDINGS: Lower chest: Emphysematous changes, scarring, and bronchiectasis in the lung bases. Postoperative changes with esophagectomy and gastric pull-through procedure. Dilated fluid-filled esophagogastric structure similar to prior studies. No change since prior study. Hepatobiliary: No focal liver abnormality is seen. Status  post cholecystectomy. No biliary dilatation. Pancreas: Unremarkable. No pancreatic ductal dilatation or surrounding inflammatory changes. Spleen: Normal in size without focal abnormality. Adrenals/Urinary Tract: Adrenal glands are unremarkable. Kidneys are normal, without renal calculi, focal lesion, or hydronephrosis. Bladder is unremarkable. Stomach/Bowel: Postoperative changes with esophagectomy and gastric pull-through. Diffusely fluid-filled distended small bowel. No discrete wall thickening. Terminal ileum is decompressed with transition zone in the right lower quadrant. Changes are likely to represent small bowel obstruction. Colon is partially stool filled but mostly decompressed. Colonic diverticulosis without evidence of acute diverticulitis. Appendix is not visualized. Vascular/Lymphatic: No significant vascular findings are present. No enlarged abdominal or pelvic lymph nodes. Reproductive: Status post hysterectomy. No adnexal masses. Other: Small amount of free fluid around the liver, likely ascites. No free air. Musculoskeletal: Previous left hip arthroplasty. Degenerative changes in the spine and right hip. No acute bony abnormalities. IMPRESSION: 1. Diffusely dilated fluid-filled small bowel with transition zone in the right lower quadrant consistent with small-bowel obstruction. 2. Postoperative changes with esophagectomy and gastric pull-through procedure, unchanged. 3. Emphysematous changes, scarring, and bronchiectasis in the lung bases. 4. Small amount of free fluid around the liver. Electronically Signed   By: Burman Nieves M.D.   On: 07/14/2023 02:21   DG Chest 1 View  Result Date: 07/13/2023 CLINICAL DATA:  Abdominal pain. EXAM:  CHEST  1 VIEW COMPARISON:  06/06/2023 FINDINGS: Elevation of the right hemidiaphragm. No change since prior study. Heart size and pulmonary vascularity are normal. No airspace disease or consolidation. No pleural effusions. Mediastinal contours appear intact.  Soft tissue shadow projecting over the right chest. Surgical clips in the right upper quadrant. Old posttraumatic or postoperative rib deformities in the right upper chest. Degenerative changes in the spine and shoulders. IMPRESSION: Elevation of the right hemidiaphragm is unchanged. No evidence of active pulmonary disease. Electronically Signed   By: Burman Nieves M.D.   On: 07/13/2023 22:20        Scheduled Meds:  enoxaparin (LOVENOX) injection  30 mg Subcutaneous Q24H   insulin aspart  0-9 Units Subcutaneous Q4H   Continuous Infusions:  dextrose 5 % and 0.45 % NaCl 75 mL/hr at 07/15/23 1156     LOS: 1 day    Time spent: 35 minutes    Tyshia Fenter Sherryll Burger, MD [facility] Pager [phone]  If 7PM-7AM, please contact night-coverage [email or web address] Password [email or web address] 07/15/2023, 2:00 PM

## 2023-07-15 NOTE — Evaluation (Signed)
Occupational Therapy Evaluation Patient Details Name: Kristy Archer MRN: [id number] DOB: 1940-12-30 Today's Date: 07/15/2023   History of Present Illness Pt is an 82 y.o. 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 PMH includes afib, HTN, DM, HOH   Clinical Impression   Pt was seen for OT evaluation this date. Prior to hospital admission, pt was living at home with her daughter. She ambulated with a RW within the home, to the bathroom, on her own. She had SBA from daughter for shower transfers, but could physically bathe herself. Daughter assisted with dressing tasks and drying off.    Pt presents to acute OT demonstrating impaired ADL performance and functional mobility 2/2 pain, weakness, nausea (See OT problem list for additional functional deficits). Pt reports pain in her stomach at 6/10. Requested pain and nausea meds from nurse, OT notified and she was provided nausea meds, but could not get pain meds yet. Pt currently requires SUP/SBA for bed mobility, CGA/SBA for STS from EOB and for lateral steps to foot of bed and back to head x2 using RW. Pt returned to seated EOB and began dry heaving, therefore returned to supine with SUP. Cold washcloth provided and applied to forehead and pt repositioned in bed comfortably. Pt would benefit from skilled OT services to address noted impairments and functional limitations (see below for any additional details) in order to maximize safety and independence while minimizing falls risk and caregiver burden. Do anticipate the need for follow up OT services upon acute hospital DC with [facility] or may progress to no OT needs d/t daughter providing some assist at baseline.       If plan is discharge home, recommend the following: A little help with walking and/or transfers;A little help with bathing/dressing/bathroom;Help with stairs or ramp for entrance;Assist  for transportation;Assistance with cooking/housework    Functional Status Assessment  Patient has had a recent decline in their functional status and demonstrates the ability to make significant improvements in function in a reasonable and predictable amount of time.  Equipment Recommendations  None recommended by OT    Recommendations for Other Services       Precautions / Restrictions Precautions Precautions: Fall Restrictions Weight Bearing Restrictions: No      Mobility Bed Mobility Overal bed mobility: Needs Assistance Bed Mobility: Supine to Sit, Sit to Supine     Supine to sit: Supervision, Used rails, HOB elevated Sit to supine: Supervision, Used rails, HOB elevated   General bed mobility comments: SUP for all bed mobility with HOB elevated and bedrails used    Transfers Overall transfer level: Needs assistance Equipment used: Rolling walker (2 wheels) Transfers: Sit to/from Stand Sit to Stand: Supervision           General transfer comment: SBA from EOB with ability to lateral step to foot of bed and back x2 before returning to seated position and begining to dry heave      Balance Overall balance assessment: Needs assistance Sitting-balance support: Feet supported Sitting balance-Leahy Scale: Fair Sitting balance - Comments: good seated balance at EOB     Standing balance-Leahy Scale: Fair Standing balance comment: need RW for support, but no LOB with lateral steps                           ADL either performed or assessed with clinical judgement   ADL Overall ADL's : At baseline  General ADL Comments: pt likely at baseline with ADL function as daughter was assisting at home     Vision         Perception         Praxis         Pertinent Vitals/Pain Pain Assessment Pain Assessment: 0-10 Pain Score: 6  Pain Location: abdomen Pain Intervention(s): Monitored during  session, Patient requesting pain meds-RN notified, Repositioned     Extremity/Trunk Assessment Upper Extremity Assessment Upper Extremity Assessment: Overall WFL for tasks assessed   Lower Extremity Assessment Lower Extremity Assessment: Generalized weakness       Communication Communication Communication: Hearing impairment   Cognition Arousal: Alert Behavior During Therapy: WFL for tasks assessed/performed Overall Cognitive Status: Within Functional Limits for tasks assessed                                       General Comments       Exercises     Shoulder Instructions      Home Living Family/patient expects to be discharged to:: Private residence Living Arrangements: Children Available Help at Discharge: Family Type of Home: House Home Access: Stairs to enter Secretary/administrator of Steps: 8- daughter helps at baseline Entrance Stairs-Rails: Can reach both Home Layout: One level     Bathroom Shower/Tub: Producer, television/film/video: Standard     Home Equipment: Agricultural consultant (2 wheels);BSC/3in1;Shower seat;Wheelchair - manual          Prior Functioning/Environment Prior Level of Function : Needs assist       Physical Assist : Mobility (physical);ADLs (physical) Mobility (physical): Stairs ADLs (physical): Dressing;IADLs Mobility Comments: RW for mobility ADLs Comments: IND with toileting, daughter ensured safety with shower transfers, but pt could bathe herself, daughter assisted with drying off and dressing        OT Problem List: Decreased strength;Pain;Decreased activity tolerance      OT Treatment/Interventions: Self-care/ADL training;Therapeutic exercise;Therapeutic activities;DME and/or AE instruction;Patient/family education;Balance training    OT Goals(Current goals can be found in the care plan section) Acute Rehab OT Goals Patient Stated Goal: return home OT Goal Formulation: With patient/family Time For Goal  Achievement: 07/29/23 Potential to Achieve Goals: Good ADL Goals Pt Will Perform Lower Body Bathing: sitting/lateral leans;sit to/from stand;with supervision Pt Will Perform Lower Body Dressing: with min assist;sitting/lateral leans;sit to/from stand Pt Will Transfer to Toilet: with modified independence;regular height toilet;ambulating Pt Will Perform Toileting - Clothing Manipulation and hygiene: with supervision;sit to/from stand;sitting/lateral leans  OT Frequency: Min 1X/week    Co-evaluation              AM-PAC OT "6 Clicks" Daily Activity     Outcome Measure Help from another person eating meals?: None Help from another person taking care of personal grooming?: None Help from another person toileting, which includes using toliet, bedpan, or urinal?: A Little Help from another person bathing (including washing, rinsing, drying)?: A Little Help from another person to put on and taking off regular upper body clothing?: None Help from another person to put on and taking off regular lower body clothing?: A Little 6 Click Score: 21   End of Session Equipment Utilized During Treatment: Rolling walker (2 wheels) Nurse Communication: Mobility status  Activity Tolerance: Patient tolerated treatment well;Treatment limited secondary to medical complications (Comment) (limited by nausea) Patient left: in bed;with call bell/phone within reach;with family/visitor present  OT Visit  Diagnosis: Muscle weakness (generalized) (M62.81)                Time: 6962-9528 OT Time Calculation (min): 16 min Charges:  OT General Charges $OT Visit: 1 Visit OT Evaluation $OT Eval Low Complexity: 1 Low Tirso Laws, OTR/L 07/15/23, 4:34 PM  Robena Ewy E Britten Parady 07/15/2023, 4:30 PM

## 2023-07-15 NOTE — Evaluation (Signed)
Physical Therapy Evaluation Patient Details Name: CAMILL DIPRIMA MRN: [id number] DOB: 1940-11-21 Today's Date: 07/15/2023  History of Present Illness  Pt is an 82 y.o. 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 PMH includes afib, HTN, DM, HOH.   Clinical Impression  Patient A&Ox4 but very HOH, denied pain. Per pt at baseline she needs assistance for ADLs from her daughter (lives with her) and ambulates with a RW for household distances, utilizes [facility] for community ambulation.   She was able to move BUE and BLE against gravity freely, rolled on to L side with CGA to finish bed bath with Environmental health practitioner. Supine to sit with minA (handheld assist provided but not necessarily required). Upon sitting the pt immediately began dry heaving, modA to return to supine and RN notified, nursing staff in room to re connect NG tube (disconnected by nursing originally in prep for mobility).  Overall the patient demonstrated deficits (see "PT Problem List") that impede the patient's functional abilities, safety, and mobility and would benefit from skilled PT intervention.         If plan is discharge home, recommend the following: A little help with bathing/dressing/bathroom;A little help with walking and/or transfers;Assist for transportation;Help with stairs or ramp for entrance;Direct supervision/assist for medications management   Can travel by private vehicle        Equipment Recommendations None recommended by PT  Recommendations for Other Services       Functional Status Assessment Patient has had a recent decline in their functional status and demonstrates the ability to make significant improvements in function in a reasonable and predictable amount of time.     Precautions / Restrictions Precautions Precautions: Fall Restrictions Weight Bearing Restrictions: No      Mobility  Bed  Mobility Overal bed mobility: Needs Assistance Bed Mobility: Supine to Sit, Sit to Supine     Supine to sit: Min assist Sit to supine: Mod assist   General bed mobility comments: handheld assist provided to come to sitting EOB not necessarily required. modA to return to supine due to dry heaving    Transfers                   General transfer comment: deferred due to nausea/dry heaving    Ambulation/Gait                  Stairs            Wheelchair Mobility     Tilt Bed    Modified Rankin (Stroke Patients Only)       Balance Overall balance assessment: Needs assistance Sitting-balance support: Feet supported Sitting balance-Leahy Scale: Fair                                       Pertinent Vitals/Pain Pain Assessment Pain Assessment: No/denies pain    Home Living Family/patient expects to be discharged to:: Private residence Living Arrangements: Children Available Help at Discharge: Family Type of Home: House Home Access: Stairs to enter Entrance Stairs-Rails: Can reach both Entrance Stairs-Number of Steps: 8- daughter helps at baseline   Home Layout: One level Home Equipment: Agricultural consultant (2 wheels);BSC/3in1;Shower seat;Wheelchair - manual      Prior Function Prior Level of Function : Needs assist       Physical Assist : Mobility (physical);ADLs (physical) Mobility (physical):  Stairs ADLs (physical): Grooming;Dressing;IADLs         Extremity/Trunk Assessment   Upper Extremity Assessment Upper Extremity Assessment: Generalized weakness    Lower Extremity Assessment Lower Extremity Assessment: Generalized weakness       Communication      Cognition Arousal: Alert Behavior During Therapy: WFL for tasks assessed/performed Overall Cognitive Status: Within Functional Limits for tasks assessed                                          General Comments      Exercises      Assessment/Plan    PT Assessment Patient needs continued PT services  PT Problem List Decreased strength;Decreased balance;Decreased mobility;Decreased activity tolerance       PT Treatment Interventions DME instruction;Neuromuscular re-education;Gait training;Stair training;Patient/family education;Functional mobility training;Therapeutic activities;Therapeutic exercise;Balance training    PT Goals (Current goals can be found in the Care Plan section)  Acute Rehab PT Goals Patient Stated Goal: to get back to normal PT Goal Formulation: With patient Time For Goal Achievement: 07/29/23 Potential to Achieve Goals: Good    Frequency Min 1X/week     Co-evaluation               AM-PAC PT "6 Clicks" Mobility  Outcome Measure Help needed turning from your back to your side while in a flat bed without using bedrails?: A Little Help needed moving from lying on your back to sitting on the side of a flat bed without using bedrails?: A Little Help needed moving to and from a bed to a chair (including a wheelchair)?: A Little Help needed standing up from a chair using your arms (e.g., wheelchair or bedside chair)?: A Little Help needed to walk in hospital room?: A Little Help needed climbing 3-5 steps with a railing? : A Little 6 Click Score: 18    End of Session   Activity Tolerance: Other (comment) (limited by nausea) Patient left: in bed;with call bell/phone within reach;with bed alarm set Nurse Communication: Mobility status PT Visit Diagnosis: Other abnormalities of gait and mobility (R26.89);Muscle weakness (generalized) (M62.81);Difficulty in walking, not elsewhere classified (R26.2)    Time: 4742-5956 PT Time Calculation (min) (ACUTE ONLY): 23 min   Charges:   PT Evaluation $PT Eval Low Complexity: 1 Low PT Treatments $Therapeutic Activity: 8-22 mins PT General Charges $$ ACUTE PT VISIT: 1 Visit        Olga Coaster PT, DPT 12:13 PM,07/15/23

## 2023-07-15 NOTE — Progress Notes (Signed)
   07/15/23 1700  Spiritual Encounters  Type of Visit Initial  Care provided to: Pt and family  Referral source Nurse (RN/NT/LPN)  Reason for visit Advance directives  OnCall Visit Yes   Chaplain provided paperwork and explained procedures for Advance Directives.

## 2023-07-15 NOTE — Plan of Care (Signed)
  Problem: Coping: Goal: Ability to adjust to condition or change in health will improve Outcome: Progressing   Problem: Fluid Volume: Goal: Ability to maintain a balanced intake and output will improve Outcome: Progressing   Problem: Tissue Perfusion: Goal: Adequacy of tissue perfusion will improve Outcome: Progressing   Problem: Education: Goal: Knowledge of General Education information will improve Description: Including pain rating scale, medication(s)/side effects and non-pharmacologic comfort measures Outcome: Progressing   Problem: Coping: Goal: Level of anxiety will decrease Outcome: Progressing   Problem: Elimination: Goal: Will not experience complications related to urinary retention Outcome: Progressing   Problem: Pain Management: Goal: General experience of comfort will improve Outcome: Progressing   Problem: Safety: Goal: Ability to remain free from injury will improve Outcome: Progressing

## 2023-07-15 NOTE — Consult Note (Addendum)
Consultation Note Date: 07/15/2023 at 1515  Patient Name: Kristy Archer  DOB: December 21, 1940  MRN: [id number]  Age / Sex: 82 y.o., female  PCP: Hortencia Conradi, NP Referring Physician: Delfino Lovett, MD  HPI/Patient Profile: 82 y.o. female  with past medical history of esophageal dysphagia with stricture s/p esophagectomy, multiple esophageal dilatations, A-fib (not on [facility]), HTN, hypothyroidism, GERD, type 2 diabetes, and multiple abdominal surgeries admitted on 07/13/2023 with SBO.   General surgery was consulted.  SBO is being treated with continuous NGT for decompression and no emergent surgical invention planned.  Conservative measures and if patient fails to improve then urgent intervention can be considered.  PMT was consulted to discuss goals of care.  Clinical Assessment and Goals of Care: Extensive chart review completed prior to meeting patient including labs, vital signs, imaging, progress notes, orders, and available advanced directive documents from current and previous encounters. I then met with patient at bedside.  She is asleep, easily awakens, but goes back to sleep.  No family or friends present during my visit.  After assessing the patient, I spoke with her daughter [name] over the phone to discuss diagnosis prognosis, GOC, EOL wishes, disposition and options.  I introduced Palliative Medicine as specialized medical care for people living with serious illness. It focuses on providing relief from the symptoms and stress of a serious illness. The goal is to improve quality of life for both the patient and the family.  We discussed a brief life review of the patient.  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  She has 2 daughters and 1 son who live nearby.  [name] says that PTA patient was not eating a lot.  She shares patient had her esophageal dilatation  only a few weeks ago.  Her food was now chopped and pured, like baby food.  She shares there just was not enough time between the dilatation and when the patient got sick this week to fully know if patient was able to take in enough calories with the chopped/pured diet.  We discussed patient's current illness and what it means in the larger context of patient's on-going co-morbidities.  Education provided on SBO, NGT, and conservative measures.  Questions and concerns were addressed.  Lab work reviewed as well as most recent x-ray, revealing possible improvement of SBO.  Advance directives, concepts specific to code status, artificial feeding and hydration, and rehospitalization were considered and discussed. [name] shares she has never discussed these topics with her mother. Her mother does not have a living will or HCPOA.  However, she and her family are interested in creating an HCPOA.  Discussed that spiritual care can assist with advance directive documentation.  She is interested in completing this during hospitalization.  Spiritual care consult placed for advanced directive creation.  I encouraged Tamare to discuss CODE STATUS, use of ventilatory support if needed, feeding tubes, and other details of advanced care planning with her mother when appropriate.  PMT remains available to  assist with these discussions as well.  I attempted to elicit values and goals important to the patient and family.[name] is hopeful that SBO will resolve and patient can return home to be with family.    She asked what the next steps are if patient is showing improvements.  Discussed that NGT will likely be clamped, a trial of clamping and then liquid diet will be attempted, and if patient is able to have flatus/BMs and avoid N/V, then surgical team and attending can assess when to pull NG and advance diet further. Serial scans of her belly can be performed to assess for improvement/decline as well.    Discussed with  [name] the importance of continued conversation with family and the medical providers regarding overall plan of care and treatment options, ensuring decisions are within the context of the patient's values and GOCs.    Questions and concerns were addressed.   [name] is aware that I am off service after today but that my PMT colleagues will continue to follow and support patient and family throughout her hospitalization. TIme for outcomes needed.   Primary Decision Maker NEXT OF KIN  Physical Exam Vitals reviewed.  Constitutional:      General: She is not in acute distress.    Appearance: She is normal weight.  HENT:     Head: Normocephalic.     Nose: Nose normal.     Comments: NG to left nare with bilious output    Mouth/Throat:     Mouth: Mucous membranes are moist.  Cardiovascular:     Rate and Rhythm: Normal rate.  Pulmonary:     Effort: Pulmonary effort is normal.  Skin:    General: Skin is warm and dry.  Neurological:     Mental Status: She is alert.  Psychiatric:        Behavior: Behavior normal.     Palliative Assessment/Data: 60%     Thank you for this consult. Palliative medicine will continue to follow and assist holistically.   Time Total: 75 minutes  Time spent includes: Detailed review of medical records (labs, imaging, vital signs), medically appropriate exam (mental status, respiratory, cardiac, skin), discussed with treatment team, counseling and educating patient, family and staff, documenting clinical information, medication management and coordination of care.  Signed by: Georgiann Cocker, DNP, FNP-BC Palliative Medicine   Please contact Palliative Medicine Team providers via [facility] for questions and concerns.

## 2023-07-15 NOTE — Progress Notes (Signed)
PHARMACIST - PHYSICIAN COMMUNICATION  CONCERNING:  Enoxaparin (Lovenox) for DVT Prophylaxis    RECOMMENDATION: Patient was prescribed enoxaprin 40mg  q24 hours for VTE prophylaxis.   Filed Weights   07/13/23 1956  Weight: 46 kg (101 lb 6.6 oz)    Body mass index is 18.55 kg/m.  Estimated Creatinine Clearance: 28.9 mL/min (A) (by C-G formula based on SCr of 1.11 mg/dL (H)).  Patient is candidate for enoxaparin 30mg  every 24 hours based on CrCl <82ml/min or Weight <45kg  DESCRIPTION: Pharmacy has adjusted enoxaparin dose per [facility] policy.  Patient is now receiving enoxaparin 30 mg every 24 hours    Merryl Hacker, PharmD Clinical Pharmacist  07/15/2023 11:23 AM

## 2023-07-15 NOTE — Progress Notes (Signed)
[facility] SURGICAL PROGRESS NOTE (cpt [phone])  Hospital Day(s): 1.  Interval History: Patient seen and examined, no acute events or new complaints overnight. Patient reports she continues to have right sided abdominal pain. Similar to presentation. No fever, chills, nausea, emesis. Bump in leukocytosis this AM; now 15.6K. Hgb to 11.0. AKI with sCr - 1.11; UO - unmeasured. Hyponatremia to 132. NGT with 150 ccs out; this is more bilious today. No flatus.   Review of Systems:  Constitutional: denies fever, chills  HEENT: denies cough or congestion  Respiratory: denies any shortness of breath  Cardiovascular: denies chest pain or palpitations  Gastrointestinal: + abdominal pain, denied N/V Genitourinary: denies burning with urination or urinary frequency  Vital signs in last 24 hours: [min-max] current  Temp:  [98.1 F (36.7 C)-99.2 F (37.3 C)] 98.5 F (36.9 C) (10/24 0422) Pulse Rate:  [76-95] 83 (10/24 0422) Resp:  [16-24] 16 (10/24 0422) BP: (91-152)/(45-70) 99/54 (10/24 0422) SpO2:  [89 %-100 %] 96 % (10/24 0422)     Height: 5\' 2"  (157.5 cm) Weight: 46 kg BMI (Calculated): 18.54   Intake/Output last 2 shifts:  10/23 0701 - 10/24 0700 In: 750 [I.V.:750] Out: 850 [Emesis/NG output:150]   Physical Exam:  Constitutional: alert, cooperative and no distress  HENT: normocephalic without obvious abnormality; NGT in place; output bilious  Eyes: PERRL, EOM's grossly intact and symmetric  Respiratory: breathing non-labored at rest  Cardiovascular: regular rate and sinus rhythm  Gastrointestinal: soft, abdominal tenderness to the right abdomen, she is not overtly distended but is tympanic, no rebound/guarding. She is without peritonitis  Musculoskeletal: no edema or wounds, motor and sensation grossly intact, NT    Labs:     Latest Ref Rng & Units 07/15/2023    2:46 AM 07/13/2023    8:03 PM 06/06/2023    2:14 PM  CBC  WBC 4.0 - 10.5 K/uL 15.6  12.0  6.5    Hemoglobin 12.0 - 15.0 g/dL 81.1  91.4  78.2   Hematocrit 36.0 - 46.0 % 33.8  42.0  37.9   Platelets 150 - 400 K/uL 251  284  253       Latest Ref Rng & Units 07/15/2023    2:46 AM 07/13/2023   11:20 PM 06/07/2023    6:29 AM  CMP  Glucose 70 - 99 mg/dL 956  213  83   BUN 8 - 23 mg/dL 24  14  10    Creatinine 0.44 - 1.00 mg/dL 0.86  5.78  4.69   Sodium 135 - 145 mmol/L 132  132  140   Potassium 3.5 - 5.1 mmol/L 3.5  4.0  3.4   Chloride 98 - 111 mmol/L 98  94  102   CO2 22 - 32 mmol/L 27  28  31    Calcium 8.9 - 10.3 mg/dL 7.6  8.7  8.2   Total Protein 6.5 - 8.1 g/dL 5.8     Total Bilirubin 0.3 - 1.2 mg/dL 0.6     Alkaline Phos 38 - 126 U/L 61     AST 15 - 41 U/L 20     ALT 0 - 44 U/L 10        Imaging studies:   KUB (07/15/2023) personally reviewed which shows adequate position of NGT given hre anatomy, there appears to be improvement in small bowel dilation, I do appreciate air in the colon, and radiologist report pending...   Assessment/Plan: (ICD-10's: K27.609) 82 y.o. female with small bowel obstruction likely  secondary to adhesive disease, complicated by multiple previous and complicated abdominal surgeries.   - Continue NGT decompression; LIS; monitor and record output - No emergent surgical intervention. We will attempt to manage this conservatively especially given her surgical history. She understands that should she fail to improve, or clinically deteriorate, we will have to consider more urgent intervention - NPO for now - Monitor renal function; continue IVF support  - Monitor abdominal examination; on-going bowel function - Serial KUBs as needed; consider gastrografin if no clinical improvements - Pain control prn; antiemetics prn              - Mobilize              - Further management per primary service; we will follow     All of the above findings and recommendations were discussed with the patient, and the medical team, and all of patient's questions were  answered to her expressed satisfaction.  -- Lynden Oxford, PA-C [facility] 07/15/2023, 7:13 AM M-F: 7am - 4pm

## 2023-07-15 NOTE — Progress Notes (Signed)
I have reviewed and concur with this student's documentation.   Allyn Kenner, RN 07/15/2023 1:25 PM

## 2023-07-16 ENCOUNTER — Inpatient Hospital Stay: Payer: Medicare HMO

## 2023-07-16 DIAGNOSIS — R7989 Other specified abnormal findings of blood chemistry: Secondary | ICD-10-CM

## 2023-07-16 DIAGNOSIS — Z515 Encounter for palliative care: Secondary | ICD-10-CM | POA: Diagnosis not present

## 2023-07-16 DIAGNOSIS — E43 Unspecified severe protein-calorie malnutrition: Secondary | ICD-10-CM | POA: Diagnosis not present

## 2023-07-16 DIAGNOSIS — R1319 Other dysphagia: Secondary | ICD-10-CM | POA: Diagnosis not present

## 2023-07-16 DIAGNOSIS — E876 Hypokalemia: Secondary | ICD-10-CM

## 2023-07-16 DIAGNOSIS — I482 Chronic atrial fibrillation, unspecified: Secondary | ICD-10-CM | POA: Diagnosis not present

## 2023-07-16 DIAGNOSIS — K56609 Unspecified intestinal obstruction, unspecified as to partial versus complete obstruction: Secondary | ICD-10-CM | POA: Diagnosis not present

## 2023-07-16 LAB — URINALYSIS, ROUTINE W REFLEX MICROSCOPIC
Bilirubin Urine: NEGATIVE
Glucose, UA: NEGATIVE mg/dL
Ketones, ur: NEGATIVE mg/dL
Leukocytes,Ua: NEGATIVE
Nitrite: NEGATIVE
Protein, ur: NEGATIVE mg/dL
Specific Gravity, Urine: 1.018 (ref 1.005–1.030)
pH: 5 (ref 5.0–8.0)

## 2023-07-16 LAB — GLUCOSE, CAPILLARY
Glucose-Capillary: 100 mg/dL — ABNORMAL HIGH (ref 70–99)
Glucose-Capillary: 100 mg/dL — ABNORMAL HIGH (ref 70–99)
Glucose-Capillary: 100 mg/dL — ABNORMAL HIGH (ref 70–99)
Glucose-Capillary: 125 mg/dL — ABNORMAL HIGH (ref 70–99)
Glucose-Capillary: 96 mg/dL (ref 70–99)
Glucose-Capillary: 98 mg/dL (ref 70–99)

## 2023-07-16 LAB — CBC
HCT: 32.2 % — ABNORMAL LOW (ref 36.0–46.0)
Hemoglobin: 10.2 g/dL — ABNORMAL LOW (ref 12.0–15.0)
MCH: 25.5 pg — ABNORMAL LOW (ref 26.0–34.0)
MCHC: 31.7 g/dL (ref 30.0–36.0)
MCV: 80.5 fL (ref 80.0–100.0)
Platelets: 207 10*3/uL (ref 150–400)
RBC: 4 MIL/uL (ref 3.87–5.11)
RDW: 16.5 % — ABNORMAL HIGH (ref 11.5–15.5)
WBC: 10.6 10*3/uL — ABNORMAL HIGH (ref 4.0–10.5)
nRBC: 0 % (ref 0.0–0.2)

## 2023-07-16 LAB — BASIC METABOLIC PANEL
Anion gap: 7 (ref 5–15)
BUN: 19 mg/dL (ref 8–23)
CO2: 29 mmol/L (ref 22–32)
Calcium: 7.7 mg/dL — ABNORMAL LOW (ref 8.9–10.3)
Chloride: 97 mmol/L — ABNORMAL LOW (ref 98–111)
Creatinine, Ser: 0.54 mg/dL (ref 0.44–1.00)
GFR, Estimated: >60 mL/min (ref >60)
Glucose, Bld: 84 mg/dL (ref 70–99)
Potassium: 3 mmol/L — ABNORMAL LOW (ref 3.5–5.1)
Sodium: 133 mmol/L — ABNORMAL LOW (ref 135–145)

## 2023-07-16 LAB — TROPONIN I (HIGH SENSITIVITY)
Troponin I (High Sensitivity): 101 ng/L (ref <18)
Troponin I (High Sensitivity): 101 ng/L (ref <18)

## 2023-07-16 MED ORDER — POTASSIUM CHLORIDE 10 MEQ/100ML IV SOLN
10.0000 meq | INTRAVENOUS | Status: AC
  Administered 2023-07-16 (×3): 10 meq via INTRAVENOUS
  Filled 2023-07-16 (×4): qty 100

## 2023-07-16 MED ORDER — PROMETHAZINE (PHENERGAN) 6.25MG IN NS 50ML IVPB
6.2500 mg | Freq: Four times a day (QID) | INTRAVENOUS | Status: DC | PRN
  Administered 2023-07-16: 6.25 mg via INTRAVENOUS
  Filled 2023-07-16: qty 6.25
  Filled 2023-07-16: qty 50

## 2023-07-16 MED ORDER — POTASSIUM CHLORIDE IN NACL 20-0.9 MEQ/L-% IV SOLN
INTRAVENOUS | Status: AC
  Filled 2023-07-16 (×2): qty 1000

## 2023-07-16 MED ORDER — POTASSIUM CHLORIDE 10 MEQ/100ML IV SOLN
10.0000 meq | Freq: Once | INTRAVENOUS | Status: AC
  Administered 2023-07-16: 10 meq via INTRAVENOUS
  Filled 2023-07-16: qty 100

## 2023-07-16 MED ORDER — ENOXAPARIN SODIUM 40 MG/0.4ML IJ SOSY
40.0000 mg | PREFILLED_SYRINGE | INTRAMUSCULAR | Status: DC
  Administered 2023-07-16 – 2023-07-19 (×4): 40 mg via SUBCUTANEOUS
  Filled 2023-07-16 (×4): qty 0.4

## 2023-07-16 NOTE — Progress Notes (Signed)
Daily Progress Note   Patient Name: Kristy Archer       Date: 07/16/2023 DOB: 1940-12-18  Age: 82 y.o. MRN#: [id number] Attending Physician: Marcelino Duster, MD Primary Care Physician: Hortencia Conradi, NP Admit Date: 07/13/2023  Reason for Consultation/Follow-up: Establishing goals of care  HPI/Brief Hospital Review: 82 y.o. female  with past medical history of esophageal dysphagia with stricture s/p esophagectomy, multiple esophageal dilatations, A-fib (not on [facility]), HTN, hypothyroidism, GERD, type 2 diabetes, and multiple abdominal surgeries admitted on 07/13/2023 with SBO.    General surgery was consulted.  SBO is being treated with continuous NGT for decompression and no emergent surgical invention planned.  Conservative measures and if patient fails to improve then urgent intervention can be considered.   PMT was consulted to discuss goals of care.   Subjective: Extensive chart review has been completed prior to meeting patient including labs, vital signs, imaging, progress notes, orders, and available advanced directive documents from current and previous encounters.    Visited with Kristy Archer at her bedside this AM. She is awake, alert and able to engage in conversation. She is aware of SBO and purpose of NGT. Reports nausea that is being controlled with IV Zofran. No family at bedside during time of visit.  Returned to bedside once notified by nursing staff that Kristy Archer was at bedside. Dr. Everlene Farrier at bedside on my return discussing current plan of care with Kristy Archer and Kristy Archer. Reviewed with Kristy Archer Dr. Hurman Archer recommendations as she is HOH.  We discussed as recommended by Dr. Everlene Farrier continuing with conservative management such as NGT for decompression and  bowel rest through the weekend, in hopes to avoid surgical intervention.  As Dr. Everlene Farrier reviewed, Kristy Archer has had multiple extensive abdominal surgeries in the past as well as significant complications from previous surgeries placing her at high risk for surgical intervention at this time also complicated by her advanced age and poor functional status at baseline.  Kristy Archer shares at baseline Kristy Archer spends most of her day in bed/chair, she is able to ambulate with assistance but requires assistance with ADL's due to profound weakness which was a result of last abdominal surgery.  Dr. Everlene Farrier also discussed possibility of needing to initiate TPN over weekend if SBO continues or no signs of improvement.  In review of  this, attempted to elicit goals of care with Kristy Archer. We discussed code status and the difference between Full Code versus Do Not Resuscitate. Encouraged patient/family to consider DNR/DNI status understanding evidenced based poor outcomes in similar hospitalized patients, as the cause of the arrest is likely associated with chronic/terminal disease rather than a reversible acute cardio-pulmonary event.  Kristy Archer shares she would be accepting of resuscitation and wishes to remain Full Code at this time but would NOT be accepting of long term life preserving measures such as trach/PEG.  Advanced Directive booklet left at bedside from PMT colleague, reviewed booklet with Kristy Archer and Kristy Archer. Kristy Archer engaged in completing document. Called again to bedside later as Kristy Archer had completed AD and in need of notary/witnesses/signatures. Paged chaplain to bedside-unfortunately chaplain shares AD not completed after 3pm-will attempt to engage again tomorrow.  PMT to continue to follow for ongoing needs and support.  Objective:  Physical Exam Constitutional:      General: She is not in acute distress.    Appearance: She is ill-appearing.     Comments: Temporal wasting   Pulmonary:     Effort: Pulmonary effort is normal. No respiratory distress.  Abdominal:     General: There is distension.     Tenderness: There is abdominal tenderness.  Skin:    General: Skin is warm and dry.  Neurological:     Mental Status: She is alert and oriented to person, place, and time.     Motor: Weakness present.             Vital Signs: BP (!) 162/62 (BP Location: Right Arm)   Pulse 80   Temp 98.1 F (36.7 C)   Resp 16   Ht 5\' 2"  (1.575 m)   Wt 46 kg   SpO2 100%   BMI 18.55 kg/m  SpO2: SpO2: 100 % O2 Device: O2 Device: Nasal Cannula O2 Flow Rate: O2 Flow Rate (L/min): 2 L/min   Palliative Care Assessment & Plan   Assessment/Recommendation/Plan  Full Code Time for outcomes Completion of AD-will engage with Chaplain services 10/26  Thank you for allowing the Palliative Medicine Team to assist in the care of this patient.  Total time:  50 minutes  Time spent includes: Detailed review of medical records (labs, imaging, vital signs), medically appropriate exam (mental status, respiratory, cardiac, skin), discussed with treatment team, counseling and educating patient, family and staff, documenting clinical information, medication management and coordination of care.  Leeanne Deed, DNP, AGNP-C Palliative Medicine   Please contact Palliative Medicine Team phone at [phone] for questions and concerns.

## 2023-07-16 NOTE — Progress Notes (Signed)
   07/16/23 1900  Spiritual Encounters  Type of Visit Follow up  Care provided to: Pt and family  Referral source Nurse (RN/NT/LPN)  Reason for visit Advance directives  OnCall Visit Yes   Chaplain received follow up with AD at 1730. No notary/witnesses available at this time. Daughter aware Monday will be the next available opportunity to complete and file documentation. Family to contact Nurse on Monday for Chaplain to assist in obtaining all signatures needed.

## 2023-07-16 NOTE — Progress Notes (Addendum)
Progress Note   Patient: Kristy Archer EXB:[id number] DOB: 1940-12-25 DOA: 07/13/2023     2 DOS: the patient was seen and examined on 07/16/2023   Brief hospital course: 82 y.o. 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 10/25: NG tube to low wall suction. Continue IV fluids with electrolyte replacements  Assessment and Plan: * SBO (small bowel obstruction) (HCC) Recurrent nausea with noted SBO on CT imaging  Noted prior GI/surgical history including esophagectomy and gastric pull through in 2009, exploratory laparotomy and LOA in 2015, as well as appendectomy and cholecystectomy  Continue antiemetics, pain control. Gentle IV fluids with KCL as she is NPO. Continue NG tube to low wall suction. Repeat KUB shows improvement. No surgical intervention per surgery team. Follow up general surgery recommendations   Hypokalemia- Due to GI losses. IV replacements ordered. Continue gentle fluids with kcl supplement. Trend daily electrolytes.  Troponin elevated- Will trend troponin. Chest pain unlikely Cardiac in nature. EKG unremarkable. Last admission for chest pain - evaluated with TTE and nuclear stress test, both unremarkable.  Echo last month - EF 75%, gr 1 dd noted.  Type 2 diabetes mellitus without complications (HCC) A1C  5.8 Blood sugar better. Very sensitive SSI in setting of SBO and NPO status   Essential hypertension BP lower side Hold home antihypertensives.  Esophageal dysphagia Noted prior history of esophagectomy and multiple esophageal dilatations in the past + concurrent SBO s/p NGT placement  Outpatient follow up with  GI.  Chronic atrial fibrillation, unspecified (HCC) Rate controlled at present  Not on [facility]. Continue telemetry monitoring.  Severe malnutrition- BMI 18.5 Dietician evaluation. If her NG remains for long time, may need alternative nutrition. Goals of care discussions ongoing with palliative team.  Pressure Injury 07/14/23 Coccyx red, moist, top layer skin gone in several areas. Out of bed to chair. Incentive spirometry. Nursing supportive care. Fall, aspiration precautions. DVT prophylaxis   Code Status: Full Code  Subjective: Patient is seen and examined today morning. She is able to answer me but is severe hard of hearing. Did complain of nausea, lower abdominal discomfort. Later in afternoon I spoke to her daughter.  Physical Exam: Vitals:   07/15/23 1541 07/16/23 0029 07/16/23 0748 07/16/23 1624  BP: (!) 124/59 (!) 140/74 (!) 142/84 (!) 162/62  Pulse: 71 81 97 80  Resp: 16 18 16 16   Temp: (!) 97.5 F (36.4 C) 98.1 F (36.7 C) 98 F (36.7 C) 98.1 F (36.7 C)  TempSrc:   Oral   SpO2: 97% 94% 97% 100%  Weight:      Height:        General - Elderly Caucasian ill female, in distress due to nausea, abdominal discomfort. Has hard of hearing. HEENT - PERRLA, EOMI, atraumatic head, NG tube to low wall suction. Lung - Clear, basal rales, rhonchi, no wheezes. Heart - S1, S2 heard, no murmurs, rubs, no pedal edema. Abdomen - Soft, lower left quad tender, non distended, bowel sounds low Neuro - Alert, awake and oriented, non focal exam. Hard of hearing Skin - Warm and dry.  Data Reviewed:      Latest Ref Rng & Units 07/16/2023    4:01 AM 07/15/2023    2:46 AM 07/13/2023    8:03  PM  CBC  WBC 4.0 - 10.5 K/uL 10.6  15.6  12.0   Hemoglobin 12.0 - 15.0 g/dL 32.4  40.1  02.7   Hematocrit 36.0 - 46.0 % 32.2  33.8  42.0   Platelets 150 - 400 K/uL 207  251  284       Latest Ref Rng & Units 07/16/2023    4:01 AM 07/15/2023    2:46 AM 07/13/2023   11:20 PM  BMP  Glucose 70  - 99 mg/dL 84  253  664   BUN 8 - 23 mg/dL 19  24  14    Creatinine 0.44 - 1.00 mg/dL 4.03  4.74  2.59   Sodium 135 - 145 mmol/L 133  132  132   Potassium 3.5 - 5.1 mmol/L 3.0  3.5  4.0   Chloride 98 - 111 mmol/L 97  98  94   CO2 22 - 32 mmol/L 29  27  28    Calcium 8.9 - 10.3 mg/dL 7.7  7.6  8.7    DG ABD ACUTE 2+V W 1V CHEST  Result Date: 07/16/2023 CLINICAL DATA:  [id number] SBO (small bowel obstruction) (HCC) [id number]. EXAM: DG ABDOMEN ACUTE WITH 1 VIEW CHEST COMPARISON:  Abdominal radiograph 07/15/2023. FINDINGS: Enteric tube tip projects over the distal esophagus. There is no evidence of dilated bowel loops or free intraperitoneal air. No radiopaque calculi or other significant radiographic abnormality is seen. Heart size and mediastinal contours are within normal limits. Both lungs are clear. Cholecystectomy clips in the right upper quadrant. Embolization coils in the left pelvis. Prior left total hip arthroplasty. Degenerative changes of the spine and right hip joint. IMPRESSION: 1. Enteric tube tip projects over the distal esophagus. Recommend advancement. 2. Nonobstructive bowel gas pattern. 3. No acute cardiopulmonary disease. Electronically Signed   By: Orvan Falconer M.D.   On: 07/16/2023 09:57   DG Abd 2 Views  Result Date: 07/15/2023 CLINICAL DATA:  82 year old female with suspected small bowel obstruction. EXAM: ABDOMEN - 2 VIEW COMPARISON:  CT Abdomen and Pelvis yesterday. FINDINGS: Portable AP upright and supine views at 0814 hours. Enteric tube terminates at the distal esophagus in the midline just above the diaphragm. External portion of the tube loops over the epigastrium. No pneumoperitoneum.  Stable lung bases. Persistent nephrograms, and excreted IV contrast in the urinary bladder. Stable cholecystectomy clips. Left pelvic, hypogastric embolization coils. Bowel-gas pattern has improved since the scout view yesterday, not completely normalized but largely nonobstructed now.  Osteopenia. Stable visualized osseous structures. Left hip arthroplasty. IMPRESSION: 1. Enteric tube terminates at the distal esophagus. Advanced at least 12 cm to ensure side hole placement within the stomach. 2. Bowel-gas pattern has improved since yesterday, suggesting resolving small bowel obstruction. No pneumoperitoneum. 3. Persistent nephrogram, query renal insufficiency. Electronically Signed   By: Odessa Fleming M.D.   On: 07/15/2023 10:29   CT Angio Chest Pulmonary Embolism (PE) W or WO Contrast  Result Date: 07/14/2023 CLINICAL DATA:  Chest pain. EXAM: CT ANGIOGRAPHY CHEST WITH CONTRAST TECHNIQUE: Multidetector CT imaging of the chest was performed using the standard protocol during bolus administration of intravenous contrast. Multiplanar CT image reconstructions and MIPs were obtained to evaluate the vascular anatomy. RADIATION DOSE REDUCTION: This exam was performed according to the departmental dose-optimization program which includes automated exposure control, adjustment of the mA and/or kV according to patient size and/or use of iterative reconstruction technique. CONTRAST:  75mL OMNIPAQUE IOHEXOL 350 MG/ML SOLN COMPARISON:  March 08, 2023. FINDINGS: Cardiovascular: Satisfactory opacification  of the pulmonary arteries to the segmental level. No evidence of pulmonary embolism. Normal heart size. No pericardial effusion. Mediastinum/Nodes: Status post gastric pull-through and esophageal resection. No definite adenopathy is noted. Feeding tube is seen passing through chest into abdomen. Lungs/Pleura: No pneumothorax is noted. Mild bilateral posterior basilar subsegmental atelectasis or infiltrates are noted. Small right pleural effusion is noted. Upper Abdomen: No acute abnormality. Musculoskeletal: No chest wall abnormality. No acute or significant osseous findings. Review of the MIP images confirms the above findings. IMPRESSION: No definite evidence of pulmonary embolism. Status post esophagectomy and  gastric pull-through. Mild bilateral posterior basilar subsegmental atelectasis or infiltrates are noted. Aortic Atherosclerosis (ICD10-I70.0). Electronically Signed   By: Lupita Raider M.D.   On: 07/14/2023 19:55     Family Communication: Discussed with patient, daughter at bedside they understand and agree. All questions answereed.  Disposition: Status is: Inpatient Remains inpatient appropriate because: Has NG tube to low wall suction.  Planned Discharge Destination: Home with Home Health     Time spent: 38 minutes  Author: Marcelino Duster, MD 07/16/2023 5:14 PM Secure chat 7am to 7pm For on call review [email or web address].

## 2023-07-16 NOTE — Progress Notes (Addendum)
[facility] PRO[facility]ESS NOTE (cpt [phone])  Hospital Day(s): 2.  Interval History: Patient seen and examined, no acute events or new complaints overnight. Patient reports he abdomen is feeling better. No fever, chills, nausea, emesis. Leukocytosis now improved; 10.6K. Hgb to 10.2. AKI resolved with sCr -  0.54; UO - unmeasured. Hypokalemia to 3.0. NGT with 250 ccs out; thinning. No reported flatus.   Review of Systems:  Constitutional: denies fever, chills  HEENT: denies cough or congestion  Respiratory: denies any shortness of breath  Cardiovascular: denies chest pain or palpitations  Gastrointestinal: + abdominal pain, denied N/V Genitourinary: denies burning with urination or urinary frequency  Vital signs in last 24 hours: [min-max] current  Temp:  [97.5 F (36.4 C)-98.1 F (36.7 C)] 98.1 F (36.7 C) (10/25 0029) Pulse Rate:  [71-81] 81 (10/25 0029) Resp:  [16-18] 18 (10/25 0029) BP: (99-140)/(57-74) 140/74 (10/25 0029) SpO2:  [94 %-99 %] 94 % (10/25 0029)     Height: 5\' 2"  (157.5 cm) Weight: 46 kg BMI (Calculated): 18.54   Intake/Output last 2 shifts:  10/24 0701 - 10/25 0700 In: 1197.8 [I.V.:1077.8; NG/GT:120] Out: 600 [Urine:350; Emesis/NG output:250]   Physical Exam:  Constitutional: alert, cooperative and no distress  HENT: normocephalic without obvious abnormality; NGT in place; output thinning  Eyes: PERRL, EOM's grossly intact and symmetric  Respiratory: breathing non-labored at rest  Cardiovascular: regular rate and sinus rhythm  Gastrointestinal: soft, abdominal tenderness to the right abdomen, she is not overtly distended but is tympanic, no rebound/guarding. She is without peritonitis  Musculoskeletal: no edema or wounds, motor and sensation grossly intact, NT    Labs:     Latest Ref Rng & Units 07/16/2023    4:01 AM 07/15/2023    2:46 AM 07/13/2023    8:03 PM  CBC  WBC 4.0 - 10.5 K/uL 10.6  15.6  12.0   Hemoglobin 12.0 - 15.0 g/dL  74.2  59.5  63.8   Hematocrit 36.0 - 46.0 % 32.2  33.8  42.0   Platelets 150 - 400 K/uL 207  251  284       Latest Ref Rng & Units 07/16/2023    4:01 AM 07/15/2023    2:46 AM 07/13/2023   11:20 PM  CMP  Glucose 70 - 99 mg/dL 84  756  433   BUN 8 - 23 mg/dL 19  24  14    Creatinine 0.44 - 1.00 mg/dL 2.95  1.88  4.16   Sodium 135 - 145 mmol/L 133  132  132   Potassium 3.5 - 5.1 mmol/L 3.0  3.5  4.0   Chloride 98 - 111 mmol/L 97  98  94   CO2 22 - 32 mmol/L 29  27  28    Calcium 8.9 - 10.3 mg/dL 7.7  7.6  8.7   Total Protein 6.5 - 8.1 g/dL  5.8    Total Bilirubin 0.3 - 1.2 mg/dL  0.6    Alkaline Phos 38 - 126 U/L  61    AST 15 - 41 U/L  20    ALT 0 - 44 U/L  10       Imaging studies:   KUB (07/16/2023) personally reviewed which shows adequate position of NGT given her anatomy, there appears to be improvement in small bowel dilation, I do appreciate air in the colon, and radiologist report pending...   Assessment/Plan: (ICD-10's: K67.609) 82 y.o. 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   - Continue NGT decompression; LIS; monitor and record output - No emergent surgical intervention. We will attempt to manage this conservatively especially given her surgical history. She understands that should she fail to improve, or clinically deteriorate, we will have to consider more urgent intervention - NPO for now - Monitor renal function; continue IVF support  - Monitor abdominal examination; on-going bowel function - Serial KUBs as needed; consider gastrografin if no clinical improvements - Pain control prn; antiemetics prn              - Mobilize              - Further management per primary service; we will follow     All of the above findings and recommendations were discussed with the patient, and the medical team, and all of patient's questions were answered to her expressed  satisfaction.  -- Lynden Oxford, PA-C [facility] 07/16/2023, 7:23 AM M-F: 7am - 4pm

## 2023-07-16 NOTE — Progress Notes (Signed)
PT Cancellation Note  Patient Details Name: Kristy Archer MRN: [id number] DOB: 15-Apr-1941   Cancelled Treatment:    Reason Eval/Treat Not Completed: Patient declined, no reason specified. Patient is VERY HOH, reports she does not feel well. Declines any mobility. Will continue to re-attempt.    [name] 07/16/2023, 1:57 PM

## 2023-07-16 NOTE — Plan of Care (Signed)

## 2023-07-17 ENCOUNTER — Inpatient Hospital Stay: Payer: Medicare HMO

## 2023-07-17 DIAGNOSIS — R079 Chest pain, unspecified: Secondary | ICD-10-CM

## 2023-07-17 DIAGNOSIS — Z515 Encounter for palliative care: Secondary | ICD-10-CM | POA: Diagnosis not present

## 2023-07-17 DIAGNOSIS — R1030 Lower abdominal pain, unspecified: Secondary | ICD-10-CM

## 2023-07-17 DIAGNOSIS — R1319 Other dysphagia: Secondary | ICD-10-CM | POA: Diagnosis not present

## 2023-07-17 DIAGNOSIS — K56609 Unspecified intestinal obstruction, unspecified as to partial versus complete obstruction: Secondary | ICD-10-CM | POA: Diagnosis not present

## 2023-07-17 DIAGNOSIS — E43 Unspecified severe protein-calorie malnutrition: Secondary | ICD-10-CM | POA: Diagnosis not present

## 2023-07-17 LAB — BASIC METABOLIC PANEL
Anion gap: 8 (ref 5–15)
BUN: 9 mg/dL (ref 8–23)
CO2: 30 mmol/L (ref 22–32)
Calcium: 8.3 mg/dL — ABNORMAL LOW (ref 8.9–10.3)
Chloride: 97 mmol/L — ABNORMAL LOW (ref 98–111)
Creatinine, Ser: 0.5 mg/dL (ref 0.44–1.00)
GFR, Estimated: >60 mL/min (ref >60)
Glucose, Bld: 78 mg/dL (ref 70–99)
Potassium: 4.6 mmol/L (ref 3.5–5.1)
Sodium: 135 mmol/L (ref 135–145)

## 2023-07-17 LAB — GLUCOSE, CAPILLARY
Glucose-Capillary: 67 mg/dL — ABNORMAL LOW (ref 70–99)
Glucose-Capillary: 69 mg/dL — ABNORMAL LOW (ref 70–99)
Glucose-Capillary: 69 mg/dL — ABNORMAL LOW (ref 70–99)
Glucose-Capillary: 74 mg/dL (ref 70–99)
Glucose-Capillary: 78 mg/dL (ref 70–99)
Glucose-Capillary: 79 mg/dL (ref 70–99)
Glucose-Capillary: 88 mg/dL (ref 70–99)

## 2023-07-17 LAB — CBC
HCT: 34.9 % — ABNORMAL LOW (ref 36.0–46.0)
Hemoglobin: 11.2 g/dL — ABNORMAL LOW (ref 12.0–15.0)
MCH: 25.6 pg — ABNORMAL LOW (ref 26.0–34.0)
MCHC: 32.1 g/dL (ref 30.0–36.0)
MCV: 79.7 fL — ABNORMAL LOW (ref 80.0–100.0)
Platelets: 255 10*3/uL (ref 150–400)
RBC: 4.38 MIL/uL (ref 3.87–5.11)
RDW: 16.4 % — ABNORMAL HIGH (ref 11.5–15.5)
WBC: 11.1 10*3/uL — ABNORMAL HIGH (ref 4.0–10.5)
nRBC: 0 % (ref 0.0–0.2)

## 2023-07-17 MED ORDER — HYDRALAZINE HCL 20 MG/ML IJ SOLN: 10.0000 mg | Freq: Four times a day (QID) | INTRAMUSCULAR | Status: DC | PRN

## 2023-07-17 NOTE — Progress Notes (Signed)
                                                                                                                                                                                                           Daily Progress Note   Patient Name: Kristy Archer       Date: 07/17/2023 DOB: 1940/12/12  Age: 82 y.o. MRN#: [id number] Attending Physician: Delfino Lovett, MD Primary Care Physician: Hortencia Conradi, NP Admit Date: 07/13/2023  Reason for Consultation/Follow-up: Establishing goals of care  HPI/Brief Hospital Review: 82 y.o. female  with past medical history of esophageal dysphagia with stricture s/p esophagectomy, multiple esophageal dilatations, A-fib (not on AC), HTN, hypothyroidism, GERD, type 2 diabetes, and multiple abdominal surgeries admitted on 07/13/2023 with SBO.    General surgery was consulted.  SBO is being treated with continuous NGT for decompression and no emergent surgical invention planned.  Conservative measures and if patient fails to improve then urgent intervention can be considered.   PMT was consulted to discuss goals of care.   Thank you for allowing the Palliative Medicine Team to assist in the care of this patient.  Total time:  25 minutes  Time spent includes: Detailed review of medical records (labs, imaging, vital signs), medically appropriate exam (mental status, respiratory, cardiac, skin), discussed with treatment team, counseling and educating patient, family and staff, documenting clinical information, medication management and coordination of care.  Leeanne Deed, DNP, AGNP-C Palliative Medicine   Please contact Palliative Medicine Team phone at [phone] for questions and concerns.

## 2023-07-17 NOTE — Plan of Care (Signed)

## 2023-07-17 NOTE — Progress Notes (Signed)
Made phone call to multiple "on call notary" left messages and waiting to hear back. Will keep updates coming as we hear back. Explained that weekends can be a challenge to get documents notarized.

## 2023-07-17 NOTE — Plan of Care (Signed)

## 2023-07-17 NOTE — Progress Notes (Signed)
Physical Therapy Treatment Patient Details Name: Kristy Archer MRN: [id number] DOB: Jul 08, 1941 Today's Date: 07/17/2023   History of Present Illness Pt is an 82 y.o. 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 PMH includes afib, HTN, DM, HOH    PT Comments  Pt was long sitting in bed upon arrival. She was on 2 L o2 but able to be discontinued and placed on rm air with sao2> 93% throughout. PT was easily able to exit bed, stand, and ambulate with RW however distance limited by fatigue. Several times during session, pt reports " I feel so weak." Acute PT will continue to progress pt to PLOF. DC recs remain appropriate.    If plan is discharge home, recommend the following: A little help with bathing/dressing/bathroom;A little help with walking and/or transfers;Assist for transportation;Help with stairs or ramp for entrance;Direct supervision/assist for medications management     Equipment Recommendations  None recommended by PT       Precautions / Restrictions Precautions Precautions: Fall Restrictions Weight Bearing Restrictions: No     Mobility  Bed Mobility Overal bed mobility: Needs Assistance Bed Mobility: Supine to Sit, Sit to Supine  Supine to sit: Supervision, Used rails, HOB elevated Sit to supine: Supervision   Transfers Overall transfer level: Needs assistance Equipment used: Rolling walker (2 wheels) Transfers: Sit to/from Stand Sit to Stand: Supervision       Ambulation/Gait Ambulation/Gait assistance: Contact guard assist Gait Distance (Feet): 60 Feet Assistive device: Rolling walker (2 wheels) Gait Pattern/deviations: Step-through pattern, Trunk flexed Gait velocity: decreased  General Gait Details: Pt ambulated ~ 60 ft. distance limited by fatigue." Im just feel so weak, I need to turn around." pt wa son rm air throughout session with sao2 > 92%    Balance  Overall balance assessment: Needs assistance Sitting-balance support: Feet supported Sitting balance-Leahy Scale: Good     Standing balance support: Bilateral upper extremity supported, During functional activity Standing balance-Leahy Scale: Fair     Cognition Arousal: Alert Behavior During Therapy: WFL for tasks assessed/performed Overall Cognitive Status: Within Functional Limits for tasks assessed      General Comments: HOH but A and cooperative               Pertinent Vitals/Pain Pain Assessment Pain Assessment: No/denies pain Pain Score: 0-No pain     PT Goals (current goals can now be found in the care plan section) Acute Rehab PT Goals Patient Stated Goal: go home Progress towards PT goals: Progressing toward goals    Frequency    Min 1X/week       AM-PAC PT "6 Clicks" Mobility   Outcome Measure  Help needed turning from your back to your side while in a flat bed without using bedrails?: A Little Help needed moving from lying on your back to sitting on the side of a flat bed without using bedrails?: A Little Help needed moving to and from a bed to a chair (including a wheelchair)?: A Little Help needed standing up from a chair using your arms (e.g., wheelchair or bedside chair)?: A Little Help needed to walk in hospital room?: A Little Help needed climbing 3-5 steps with a railing? : A Little 6 Click Score: 18    End of Session   Activity Tolerance: Patient limited by fatigue Patient left: in bed;with call bell/phone within reach;with bed alarm set Nurse Communication: Mobility status PT Visit Diagnosis: Other abnormalities of gait and  mobility (R26.89);Muscle weakness (generalized) (M62.81);Difficulty in walking, not elsewhere classified (R26.2)     Time: 1610-9604 PT Time Calculation (min) (ACUTE ONLY): 20 min  Charges:    $Gait Training: 8-22 mins PT General Charges $$ ACUTE PT VISIT: 1 Visit                    Jetta Lout  PTA 07/17/23, 3:30 PM

## 2023-07-17 NOTE — Progress Notes (Signed)
CC: Partial SBO Subjective: Feeling better, kub reviewed showing significant improvement in dilation of bowel.  Objective: Vital signs in last 24 hours: Temp:  [98.3 F (36.8 C)-98.8 F (37.1 C)] 98.7 F (37.1 C) (10/26 1622) Pulse Rate:  [80-89] 89 (10/26 0839) Resp:  [15-16] 16 (10/26 1622) BP: (159-170)/(63-92) 159/91 (10/26 1622) SpO2:  [93 %-99 %] 93 % (10/26 1622) Last BM Date : 07/12/23  Intake/Output from previous day: 10/25 0701 - 10/26 0700 In: 0  Out: 700 [Urine:200; Emesis/NG output:500] Intake/Output this shift: Total I/O In: -  Out: 500 [Emesis/NG output:500]  Physical exam: Debilitated and cachectic Abd: soft, mild diffuse tenderness w/o peritoniitis  Lab Results: CBC  Recent Labs    07/16/23 0401 07/17/23 0453  WBC 10.6* 11.1*  HGB 10.2* 11.2*  HCT 32.2* 34.9*  PLT 207 255   BMET Recent Labs    07/16/23 0401 07/17/23 0453  NA 133* 135  K 3.0* 4.6  CL 97* 97*  CO2 29 30  GLUCOSE 84 78  BUN 19 9  CREATININE 0.54 0.50  CALCIUM 7.7* 8.3*   PT/INR No results for input(s): "LABPROT", "INR" in the last 72 hours. ABG No results for input(s): "PHART", "HCO3" in the last 72 hours.  Invalid input(s): "PCO2", "PO2"  Studies/Results: DG ABD ACUTE 2+V W 1V CHEST  Result Date: 07/17/2023 CLINICAL DATA:  [id number] SBO (small bowel obstruction) (HCC) [id number] EXAM: DG ABDOMEN ACUTE WITH 1 VIEW CHEST COMPARISON:  July 16, 2023 FINDINGS: Enteric tube tip and side port project over the inferior thorax in the expected area of the gastric pull-through. Mild diffuse gaseous distension of loops of bowel in the abdomen without dilation. Air is visualized in the rectum. Osteopenia. Status post LEFT hip arthroplasty. LEFT pelvic embolization coils. Chronic elevation of the RIGHT hemidiaphragm. IMPRESSION: 1. Enteric tube tip and side port project over the expected area of the gastric pull-through. 2. Mild diffuse gaseous distension of loops of bowel in the abdomen  without dilation. Electronically Signed   By: Meda Klinefelter M.D.   On: 07/17/2023 13:11   DG ABD ACUTE 2+V W 1V CHEST  Result Date: 07/16/2023 CLINICAL DATA:  [id number] SBO (small bowel obstruction) (HCC) [id number]. EXAM: DG ABDOMEN ACUTE WITH 1 VIEW CHEST COMPARISON:  Abdominal radiograph 07/15/2023. FINDINGS: Enteric tube tip projects over the distal esophagus. There is no evidence of dilated bowel loops or free intraperitoneal air. No radiopaque calculi or other significant radiographic abnormality is seen. Heart size and mediastinal contours are within normal limits. Both lungs are clear. Cholecystectomy clips in the right upper quadrant. Embolization coils in the left pelvis. Prior left total hip arthroplasty. Degenerative changes of the spine and right hip joint. IMPRESSION: 1. Enteric tube tip projects over the distal esophagus. Recommend advancement. 2. Nonobstructive bowel gas pattern. 3. No acute cardiopulmonary disease. Electronically Signed   By: Orvan Falconer M.D.   On: 07/16/2023 09:57    Anti-infectives: Anti-infectives (From admission, onward)    Start     Dose/Rate Route Frequency Ordered Stop   07/15/23 2200  nitrofurantoin (macrocrystal-monohydrate) (MACROBID) capsule 100 mg        100 mg Per Tube Daily at bedtime 07/15/23 1836         Assessment/Plan: Partial small bowel obstruction improving.  No need for surgical intervention.  We will do NG clamping as well as clear liquid diet. If she fails NG clamping we will need to start TPN and PICC line. Daughter updated  Sterling Big, MD, FACS  07/17/2023     

## 2023-07-17 NOTE — Progress Notes (Addendum)
Progress Note   Patient: Kristy Archer ZOX:[id number] DOB: 05/10/1941 DOA: 07/13/2023     3 DOS: the patient was seen and examined on 07/17/2023   Brief hospital course: 82 y.o. 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 10/25-26: NG tube to low intermittent suction.  Daily KUB.  Palliative care consult  Assessment and Plan: * SBO (small bowel obstruction) (HCC) Recurrent nausea with noted SBO on CT imaging  Noted prior GI/surgical history including esophagectomy and gastric pull through in 2009, exploratory laparotomy and LOA in 2015, as well as appendectomy and cholecystectomy  Continue antiemetics, pain control. Gentle IV fluids with KCL as she is NPO. Continue NG tube to low wall suction. Repeat KUB shows improvement. No surgical intervention per surgery team. Follow up general surgery recommendations   Hypokalemia- Due to GI losses. Repleted and resolved  Troponin elevated- Noncardiac, due to supply/demand ischemia EKG unremarkable. Last admission for chest pain - evaluated with TTE and nuclear stress test, both unremarkable.  Echo last month - EF 75%, gr 1 dd noted.  Type 2 diabetes mellitus without complications (HCC) A1C  5.8 Blood sugar better. Very sensitive SSI in setting of SBO and NPO status.  Follow hypoglycemia protocol if needed Her blood sugar did drop this morning to 69 but she was asymptomatic  Essential hypertension BP trending up.  Will add as needed IV hydralazine Hold home antihypertensives.  Esophageal dysphagia Noted prior history of esophagectomy and multiple esophageal dilatations in the past + concurrent  SBO s/p NGT placement  Outpatient follow up with GI.  Chronic atrial fibrillation, unspecified (HCC) Rate controlled at present  Not on [facility]. Continue telemetry monitoring.  Severe malnutrition- BMI 18.5 Dietician evaluation. If her NG remains for long time, may need alternative nutrition. Goals of care discussions ongoing with palliative team.  Pressure Injury 07/14/23 Coccyx red, moist, top layer skin gone in several areas.     Code Status: Full Code  Subjective: Has severe hard of hearing.  Minimal abdominal discomfort and nausea.  Blood sugar dropped to 69 this morning but she was asymptomatic.  Physical Exam: Vitals:   07/16/23 1624 07/16/23 1942 07/17/23 0507 07/17/23 0839  BP: (!) 162/62 (!) 170/63 (!) 164/65 (!) 160/92  Pulse: 80 87 80 89  Resp: 16   15  Temp: 98.1 F (36.7 C) 98.8 F (37.1 C)  98.3 F (36.8 C)  TempSrc:  Oral    SpO2: 100% 99% 98% 98%  Weight:      Height:        General - Elderly Caucasian ill female, in distress due to nausea, abdominal discomfort.  hard of hearing. HEENT - PERRLA, EOMI, atraumatic head, NG tube to low intermittent suction. Lung - Clear, no rales, rhonchi no wheezes. Heart - S1, S2 heard, no murmurs, rubs, no pedal edema. Abdomen - Soft, lower left quad tender, non distended, bowel sounds low Neuro - Alert, awake and oriented, non focal exam. Hard of hearing Skin - Warm and dry.  Data Reviewed:      Latest Ref Rng & Units 07/17/2023    4:53 AM 07/16/2023    4:01 AM 07/15/2023    2:46 AM  CBC  WBC 4.0 - 10.5 K/uL 11.1  10.6  15.6  Hemoglobin 12.0 - 15.0 g/dL 16.1  09.6  04.5   Hematocrit 36.0 - 46.0 % 34.9  32.2  33.8   Platelets 150 - 400 K/uL 255  207  251       Latest Ref Rng & Units 07/17/2023    4:53 AM 07/16/2023    4:01 AM 07/15/2023    2:46 AM  BMP  Glucose 70 - 99 mg/dL 78  84  409   BUN 8 - 23 mg/dL 9  19  24    Creatinine 0.44 - 1.00 mg/dL 8.11  9.14  7.82   Sodium 135 - 145 mmol/L 135  133  132    Potassium 3.5 - 5.1 mmol/L 4.6  3.0  3.5   Chloride 98 - 111 mmol/L 97  97  98   CO2 22 - 32 mmol/L 30  29  27    Calcium 8.9 - 10.3 mg/dL 8.3  7.7  7.6    DG ABD ACUTE 2+V W 1V CHEST  Result Date: 07/16/2023 CLINICAL DATA:  [id number] SBO (small bowel obstruction) (HCC) [id number]. EXAM: DG ABDOMEN ACUTE WITH 1 VIEW CHEST COMPARISON:  Abdominal radiograph 07/15/2023. FINDINGS: Enteric tube tip projects over the distal esophagus. There is no evidence of dilated bowel loops or free intraperitoneal air. No radiopaque calculi or other significant radiographic abnormality is seen. Heart size and mediastinal contours are within normal limits. Both lungs are clear. Cholecystectomy clips in the right upper quadrant. Embolization coils in the left pelvis. Prior left total hip arthroplasty. Degenerative changes of the spine and right hip joint. IMPRESSION: 1. Enteric tube tip projects over the distal esophagus. Recommend advancement. 2. Nonobstructive bowel gas pattern. 3. No acute cardiopulmonary disease. Electronically Signed   By: Orvan Falconer M.D.   On: 07/16/2023 09:57     Family Communication: Discussed with patient, daughter at bedside they understand and agree. All questions answereed.  Disposition: Status is: Inpatient Remains inpatient appropriate because: Has NG tube to low wall suction.  No family at bedside  Planned Discharge Destination: Home with Home Health     Time spent: 32 minutes  Author: Delfino Lovett, MD 07/17/2023 12:48 PM Secure chat 7am to 7pm For on call review [email or web address].

## 2023-07-17 NOTE — Progress Notes (Signed)
followed up with pt and daughter at bedside. explained challenges with finding a notary today. Offered to have chaplain services follow up tomorrow and Monday if not completed. She shared she appreciated the efforts. The document is completed with the exception of the final page. No other needs shared at this time. Family and pt knows how to reach chaplain services if needs arise.

## 2023-07-18 DIAGNOSIS — R1319 Other dysphagia: Secondary | ICD-10-CM | POA: Diagnosis not present

## 2023-07-18 DIAGNOSIS — Z515 Encounter for palliative care: Secondary | ICD-10-CM | POA: Diagnosis not present

## 2023-07-18 DIAGNOSIS — K56609 Unspecified intestinal obstruction, unspecified as to partial versus complete obstruction: Secondary | ICD-10-CM | POA: Diagnosis not present

## 2023-07-18 DIAGNOSIS — I482 Chronic atrial fibrillation, unspecified: Secondary | ICD-10-CM | POA: Diagnosis not present

## 2023-07-18 DIAGNOSIS — E43 Unspecified severe protein-calorie malnutrition: Secondary | ICD-10-CM | POA: Diagnosis not present

## 2023-07-18 DIAGNOSIS — R079 Chest pain, unspecified: Secondary | ICD-10-CM | POA: Diagnosis not present

## 2023-07-18 LAB — CBC
HCT: 32.1 % — ABNORMAL LOW (ref 36.0–46.0)
Hemoglobin: 10.1 g/dL — ABNORMAL LOW (ref 12.0–15.0)
MCH: 25.7 pg — ABNORMAL LOW (ref 26.0–34.0)
MCHC: 31.5 g/dL (ref 30.0–36.0)
MCV: 81.7 fL (ref 80.0–100.0)
Platelets: 237 10*3/uL (ref 150–400)
RBC: 3.93 MIL/uL (ref 3.87–5.11)
RDW: 16.2 % — ABNORMAL HIGH (ref 11.5–15.5)
WBC: 8.8 10*3/uL (ref 4.0–10.5)
nRBC: 0 % (ref 0.0–0.2)

## 2023-07-18 LAB — BASIC METABOLIC PANEL
Anion gap: 7 (ref 5–15)
BUN: 11 mg/dL (ref 8–23)
CO2: 27 mmol/L (ref 22–32)
Calcium: 7.9 mg/dL — ABNORMAL LOW (ref 8.9–10.3)
Chloride: 99 mmol/L (ref 98–111)
Creatinine, Ser: 0.52 mg/dL (ref 0.44–1.00)
GFR, Estimated: >60 mL/min (ref >60)
Glucose, Bld: 57 mg/dL — ABNORMAL LOW (ref 70–99)
Potassium: 4.3 mmol/L (ref 3.5–5.1)
Sodium: 133 mmol/L — ABNORMAL LOW (ref 135–145)

## 2023-07-18 LAB — GLUCOSE, CAPILLARY
Glucose-Capillary: 108 mg/dL — ABNORMAL HIGH (ref 70–99)
Glucose-Capillary: 46 mg/dL — ABNORMAL LOW (ref 70–99)
Glucose-Capillary: 60 mg/dL — ABNORMAL LOW (ref 70–99)
Glucose-Capillary: 67 mg/dL — ABNORMAL LOW (ref 70–99)
Glucose-Capillary: 86 mg/dL (ref 70–99)
Glucose-Capillary: 91 mg/dL (ref 70–99)
Glucose-Capillary: 93 mg/dL (ref 70–99)
Glucose-Capillary: 94 mg/dL (ref 70–99)

## 2023-07-18 MED ORDER — ACETAMINOPHEN 325 MG PO TABS
650.0000 mg | ORAL_TABLET | Freq: Four times a day (QID) | ORAL | Status: DC | PRN
  Administered 2023-07-18: 650 mg via ORAL
  Filled 2023-07-18: qty 2

## 2023-07-18 MED ORDER — AMLODIPINE BESYLATE 5 MG PO TABS
5.0000 mg | ORAL_TABLET | Freq: Every day | ORAL | Status: DC
  Administered 2023-07-18 – 2023-07-19 (×2): 5 mg via ORAL
  Filled 2023-07-18 (×2): qty 1

## 2023-07-18 MED ORDER — NITROFURANTOIN MONOHYD MACRO 100 MG PO CAPS
100.0000 mg | ORAL_CAPSULE | Freq: Every day | ORAL | Status: DC
  Administered 2023-07-19: 100 mg via ORAL
  Filled 2023-07-18 (×2): qty 1

## 2023-07-18 MED ORDER — ENSURE ENLIVE PO LIQD
237.0000 mL | Freq: Two times a day (BID) | ORAL | Status: DC
  Administered 2023-07-19: 237 mL via ORAL

## 2023-07-18 MED ORDER — OXYCODONE HCL 5 MG PO TABS
5.0000 mg | ORAL_TABLET | Freq: Four times a day (QID) | ORAL | Status: DC | PRN
  Administered 2023-07-18 – 2023-07-20 (×6): 5 mg via ORAL
  Filled 2023-07-18 (×6): qty 1

## 2023-07-18 NOTE — Progress Notes (Signed)
Hypoglycemic Event  CBG: 67  Treatment: 4 oz juice/soda  Symptoms: None  Follow-up CBG: Time:15 CBG Result:108  Possible Reasons for Event: Inadequate meal intake  Comments/MD notified:Dr Sherryll Burger notified,no new orders. Patient Administered   hypoglycemia protocol.    Kristy Archer Kristy Archer

## 2023-07-18 NOTE — Progress Notes (Signed)
CC: Partial SBO  Subjective:  Passed clamping trial and now is tolerating clear liquids.  She did have some flatus.  No emesis WBC trending down Objective: Vital signs in last 24 hours: Temp:  [98 F (36.7 C)-98.9 F (37.2 C)] 98 F (36.7 C) (10/27 0700) Pulse Rate:  [82-103] 82 (10/27 0700) Resp:  [16] 16 (10/27 0700) BP: (158-165)/(66-91) 165/66 (10/27 0700) SpO2:  [93 %-97 %] 97 % (10/27 0700) Last BM Date : 07/12/23  Intake/Output from previous day: 10/26 0701 - 10/27 0700 In: 480 [P.O.:480] Out: 650 [Emesis/NG output:650] Intake/Output this shift: Total I/O In: 180 [P.O.:180] Out: -   Physical exam: Debilitated and cachectic Abd: soft, mild diffuse tenderness w/o peritoniitis  Lab Results: CBC  Recent Labs    07/17/23 0453 07/18/23 0343  WBC 11.1* 8.8  HGB 11.2* 10.1*  HCT 34.9* 32.1*  PLT 255 237   BMET Recent Labs    07/17/23 0453 07/18/23 0343  NA 135 133*  K 4.6 4.3  CL 97* 99  CO2 30 27  GLUCOSE 78 57*  BUN 9 11  CREATININE 0.50 0.52  CALCIUM 8.3* 7.9*   PT/INR No results for input(s): "LABPROT", "INR" in the last 72 hours. ABG No results for input(s): "PHART", "HCO3" in the last 72 hours.  Invalid input(s): "PCO2", "PO2"  Studies/Results: DG ABD ACUTE 2+V W 1V CHEST  Result Date: 07/17/2023 CLINICAL DATA:  [id number] SBO (small bowel obstruction) (HCC) [id number] EXAM: DG ABDOMEN ACUTE WITH 1 VIEW CHEST COMPARISON:  July 16, 2023 FINDINGS: Enteric tube tip and side port project over the inferior thorax in the expected area of the gastric pull-through. Mild diffuse gaseous distension of loops of bowel in the abdomen without dilation. Air is visualized in the rectum. Osteopenia. Status post LEFT hip arthroplasty. LEFT pelvic embolization coils. Chronic elevation of the RIGHT hemidiaphragm. IMPRESSION: 1. Enteric tube tip and side port project over the expected area of the gastric pull-through. 2. Mild diffuse gaseous distension of loops of bowel  in the abdomen without dilation. Electronically Signed   By: Meda Klinefelter M.D.   On: 07/17/2023 13:11    Anti-infectives: Anti-infectives (From admission, onward)    Start     Dose/Rate Route Frequency Ordered Stop   07/15/23 2200  nitrofurantoin (macrocrystal-monohydrate) (MACROBID) capsule 100 mg        100 mg Per Tube Daily at bedtime 07/15/23 1836         Assessment/Plan: Small bowel obstruction resolving.  Continue full liquids.  Given prior esophageal stricture she may have to be on a full liquid diet and at the most soft diet.  Anticipate discharge in the next 24 to 48 hours.  Family updated. No need for surgical intervention   Sterling Big, MD, [facility]  07/18/2023

## 2023-07-18 NOTE — Progress Notes (Signed)
                                                                                                                                                                                                           Daily Progress Note   Patient Name: Kristy Archer       Date: 07/18/2023 DOB: 04-14-1941  Age: 82 y.o. MRN#: [id number] Attending Physician: Delfino Lovett, MD Primary Care Physician: Hortencia Conradi, NP Admit Date: 07/13/2023  Reason for Consultation/Follow-up: Establishing goals of care  HPI/Brief Hospital Review: 82 y.o. female  with past medical history of esophageal dysphagia with stricture s/p esophagectomy, multiple esophageal dilatations, A-fib (not on AC), HTN, hypothyroidism, GERD, type 2 diabetes, and multiple abdominal surgeries admitted on 07/13/2023 with SBO.    General surgery was consulted.  SBO is being treated with continuous NGT for decompression and no emergent surgical invention planned.  Conservative measures and if patient fails to improve then urgent intervention can be considered.  Palliative Medicine consulted for assisting with goals of care conversations.  Subjective: Extensive chart review has been completed prior to meeting patient including labs, vital signs, imaging, progress notes, orders, and available advanced directive documents from current and previous encounters.    Visited with Kristy Archer at her bedside. She is awake, alert and able to engage in conversations. NGT has been removed, she reports much improvement in her abdominal pain, nausea and is able to tolerate liquid diet. She reports having a bowel movement overnight.  No acute palliative needs. Anticipate discharge over next 24-48 hours. Encouraged [name] to reach out to PMT if any needs or concerns arise.  Thank you for allowing the Palliative Medicine Team to assist in the care of this patient.  Total time:  25 minutes  Time spent includes: Detailed review of medical records (labs,  imaging, vital signs), medically appropriate exam (mental status, respiratory, cardiac, skin), discussed with treatment team, counseling and educating patient, family and staff, documenting clinical information, medication management and coordination of care.  Leeanne Deed, DNP, AGNP-C Palliative Medicine   Please contact Palliative Medicine Team phone at [phone] for questions and concerns.

## 2023-07-18 NOTE — Progress Notes (Signed)
Progress Note   Patient: Kristy Archer:[id number] DOB: [birth year] DOA: 07/13/2023     4 DOS: the patient was seen and examined on 07/18/2023   Brief hospital course: 82 y.o. 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 10/25: NG tube to low intermittent suction.  Daily KUB.  Palliative care consult 10/26: NG tube removed, clear liquid diet started 10/27: Advanced to full liquid diet  Assessment and Plan: * SBO (small bowel obstruction) (HCC) Recurrent nausea with noted SBO on CT imaging  Noted prior GI/surgical history including esophagectomy and gastric pull through in 2009, exploratory laparotomy and LOA in 2015, as well as appendectomy and cholecystectomy  Continue antiemetics, pain control. NG tube removed and tolerated clear liquid diet yesterday Diet advanced to full liquid today  Hypokalemia- Due to GI losses. Repleted and resolved  Troponin elevated- Noncardiac, due to supply/demand ischemia EKG unremarkable. Last admission for chest pain - evaluated with TTE and nuclear stress test, both unremarkable.  Echo last month - EF 75%, gr 1 dd noted.  Type 2 diabetes mellitus without complications (HCC) A1C  5.8 Blood sugar better. She does have intermittent hypoglycemia episode although she remains asymptomatic.  Hopefully with her eating oral her sugar will improve.  Essential hypertension Resume amlodipine today as blood pressure is high  Esophageal dysphagia Noted prior history of esophagectomy and multiple esophageal dilatations in the past + concurrent SBO s/p NGT placement  Outpatient follow up with GI.  Chronic atrial  fibrillation, unspecified (HCC) Rate controlled at present  Not on [facility]. Continue telemetry monitoring.  Severe malnutrition- BMI 18.5 Dietician evaluation.   Pressure Injury 07/14/23 Coccyx red, moist, top layer skin gone in several areas.     Code Status: Full Code  Subjective: Tolerating clear liquid diet since NG tube removed last evening.  Denies any other symptoms.  Wants to eat some solid.  Physical Exam: Vitals:   07/17/23 0839 07/17/23 1622 07/17/23 2034 07/18/23 0700  BP: (!) 160/92 (!) 159/91 (!) 158/83 (!) 165/66  Pulse: 89  (!) 103 82  Resp: 15 16  16   Temp: 98.3 F (36.8 C) 98.7 F (37.1 C) 98.9 F (37.2 C) 98 F (36.7 C)  TempSrc:   Oral Oral  SpO2: 98% 93% 97% 97%  Weight:      Height:        General - Elderly Caucasian ill female, in distress due to nausea, abdominal discomfort.  hard of hearing. HEENT - PERRLA, EOMI, atraumatic head Lung - Clear, no rales, rhonchi no wheezes. Heart - S1, S2 heard, no murmurs, rubs, no pedal edema. Abdomen - Soft, lower left quad tender, non distended, bowel sounds low Neuro - Alert, awake and oriented, non focal exam. Hard of hearing Skin - Warm and dry.  Data Reviewed:      Latest Ref Rng & Units 07/18/2023    3:43 AM 07/17/2023    4:53 AM 07/16/2023    4:01 AM  CBC  WBC 4.0 - 10.5 K/uL 8.8  11.1  10.6   Hemoglobin 12.0 - 15.0 g/dL 95.6  21.3  08.6   Hematocrit 36.0 - 46.0 % 32.1  34.9  32.2   Platelets 150 - 400 K/uL 237  255  207  Latest Ref Rng & Units 07/18/2023    3:43 AM 07/17/2023    4:53 AM 07/16/2023    4:01 AM  BMP  Glucose 70 - 99 mg/dL 57  78  84   BUN 8 - 23 mg/dL 11  9  19    Creatinine 0.44 - 1.00 mg/dL 7.82  9.56  2.13   Sodium 135 - 145 mmol/L 133  135  133   Potassium 3.5 - 5.1 mmol/L 4.3  4.6  3.0   Chloride 98 - 111 mmol/L 99  97  97   CO2 22 - 32 mmol/L 27  30  29    Calcium 8.9 - 10.3 mg/dL 7.9  8.3  7.7    DG ABD ACUTE 2+V W 1V CHEST  Result Date: 07/17/2023 CLINICAL  DATA:  [id number] SBO (small bowel obstruction) (HCC) [id number] EXAM: DG ABDOMEN ACUTE WITH 1 VIEW CHEST COMPARISON:  July 16, 2023 FINDINGS: Enteric tube tip and side port project over the inferior thorax in the expected area of the gastric pull-through. Mild diffuse gaseous distension of loops of bowel in the abdomen without dilation. Air is visualized in the rectum. Osteopenia. Status post LEFT hip arthroplasty. LEFT pelvic embolization coils. Chronic elevation of the RIGHT hemidiaphragm. IMPRESSION: 1. Enteric tube tip and side port project over the expected area of the gastric pull-through. 2. Mild diffuse gaseous distension of loops of bowel in the abdomen without dilation. Electronically Signed   By: Meda Klinefelter M.D.   On: 07/17/2023 13:11     Family Communication: Discussed with patient, daughter at bedside they understand and agree. All questions answereed.  Disposition: Status is: Inpatient Remains inpatient appropriate because: Await tolerance of diet before discharge no family at bedside  Planned Discharge Destination: Home with Home Health.  Possible discharge in next 1 to 2 days     Time spent: 35 minutes  Author: Delfino Lovett, MD 07/18/2023 1:09 PM Secure chat 7am to 7pm For on call review [email or web address].

## 2023-07-18 NOTE — TOC Progression Note (Signed)
Transition of Care [facility]) - Progression Note    Patient Details  Name: Kristy Archer MRN: [id number] Date of Birth: August 24, 1941  Transition of Care [facility]) CM/SW Contact  Bing Quarry, RN Phone Number: 07/18/2023, 4:08 PM  Clinical Narrative: 07/18/23: Sherron Monday with daughter regarding HH. Patient was active with [facility] in August 2024 and wished to go with that agency again. Kerrie [place]. Accepted pending orders at discharge. Had RN/PT last time per [facility]. Per daughter patient has hospital bed, RW, and WC at home.  TOC to notify when orders/discharge ready.   Gabriel Cirri MSN RN CM  Care Management Department.  [place]  [facility] Direct Dial: [phone] Main Office Phone: [phone] Weekends Only           Expected Discharge Plan and Services                                     [facility] Agency: [facility]         Social Determinants of Health (SDOH) Interventions SDOH Screenings   Food Insecurity: No Food Insecurity (07/14/2023)  Housing: Low Risk  (07/14/2023)  Transportation Needs: No Transportation Needs (07/14/2023)  Utilities: Not At Risk (07/14/2023)  Tobacco Use: Low Risk  (07/13/2023)    Readmission Risk Interventions     No data to display

## 2023-07-18 NOTE — Progress Notes (Signed)
Mobility Specialist - Progress Note   07/18/23 1435  Mobility  Activity Ambulated with assistance in hallway;Stood at bedside;Dangled on edge of bed  Level of Assistance Standby assist, set-up cues, supervision of patient - no hands on  Assistive Device Front wheel walker  Distance Ambulated (ft) 80 ft  Activity Response Tolerated well  Mobility Referral Yes  $Mobility charge 1 Mobility  Mobility Specialist Start Time (ACUTE ONLY) 1402  Mobility Specialist Stop Time (ACUTE ONLY) 1425  Mobility Specialist Time Calculation (min) (ACUTE ONLY) 23 min   Pt supine asleep in bed on 2L upon arrival. Pt awakens to light touch and agreeable to OOB mobility. Pt completes bed mobility indep, and STS and ambulates in hallway SBA. Pt returns to bed with needs in reach and bed alarm activated.   Terrilyn Saver  Mobility Specialist  07/18/23 2:36 PM

## 2023-07-18 NOTE — Plan of Care (Signed)

## 2023-07-18 NOTE — Plan of Care (Signed)

## 2023-07-19 DIAGNOSIS — K56609 Unspecified intestinal obstruction, unspecified as to partial versus complete obstruction: Secondary | ICD-10-CM | POA: Diagnosis not present

## 2023-07-19 DIAGNOSIS — E43 Unspecified severe protein-calorie malnutrition: Secondary | ICD-10-CM | POA: Diagnosis not present

## 2023-07-19 DIAGNOSIS — I10 Essential (primary) hypertension: Secondary | ICD-10-CM | POA: Diagnosis not present

## 2023-07-19 DIAGNOSIS — R079 Chest pain, unspecified: Secondary | ICD-10-CM | POA: Diagnosis not present

## 2023-07-19 LAB — GLUCOSE, CAPILLARY
Glucose-Capillary: 106 mg/dL — ABNORMAL HIGH (ref 70–99)
Glucose-Capillary: 123 mg/dL — ABNORMAL HIGH (ref 70–99)
Glucose-Capillary: 152 mg/dL — ABNORMAL HIGH (ref 70–99)
Glucose-Capillary: 168 mg/dL — ABNORMAL HIGH (ref 70–99)
Glucose-Capillary: 63 mg/dL — ABNORMAL LOW (ref 70–99)
Glucose-Capillary: 63 mg/dL — ABNORMAL LOW (ref 70–99)
Glucose-Capillary: 76 mg/dL (ref 70–99)
Glucose-Capillary: 79 mg/dL (ref 70–99)

## 2023-07-19 MED ORDER — PANTOPRAZOLE SODIUM 40 MG PO TBEC
40.0000 mg | DELAYED_RELEASE_TABLET | Freq: Two times a day (BID) | ORAL | Status: DC
  Administered 2023-07-19 – 2023-07-20 (×2): 40 mg via ORAL
  Filled 2023-07-19 (×2): qty 1

## 2023-07-19 MED ORDER — ACETAMINOPHEN 325 MG PO TABS: 650.0000 mg | ORAL_TABLET | Freq: Four times a day (QID) | ORAL | Status: DC | PRN

## 2023-07-19 MED ORDER — ENSURE ENLIVE PO LIQD
237.0000 mL | Freq: Three times a day (TID) | ORAL | Status: DC
  Administered 2023-07-19 – 2023-07-20 (×2): 237 mL via ORAL

## 2023-07-19 MED ORDER — METOPROLOL TARTRATE 25 MG PO TABS
25.0000 mg | ORAL_TABLET | Freq: Two times a day (BID) | ORAL | Status: DC
  Administered 2023-07-19: 25 mg via ORAL
  Filled 2023-07-19: qty 1

## 2023-07-19 MED ORDER — ADULT MULTIVITAMIN W/MINERALS CH
1.0000 | ORAL_TABLET | Freq: Every day | ORAL | Status: DC
  Administered 2023-07-19 – 2023-07-20 (×2): 1 via ORAL
  Filled 2023-07-19 (×2): qty 1

## 2023-07-19 MED ORDER — LEVOTHYROXINE SODIUM 88 MCG PO TABS
88.0000 ug | ORAL_TABLET | Freq: Every morning | ORAL | Status: DC
  Administered 2023-07-20: 88 ug via ORAL
  Filled 2023-07-19: qty 1

## 2023-07-19 MED ORDER — HYDROMORPHONE HCL 1 MG/ML IJ SOLN
0.5000 mg | Freq: Once | INTRAMUSCULAR | Status: AC
  Administered 2023-07-19: 0.5 mg via INTRAVENOUS
  Filled 2023-07-19: qty 0.5

## 2023-07-19 MED ORDER — SOTALOL HCL 80 MG PO TABS
80.0000 mg | ORAL_TABLET | Freq: Two times a day (BID) | ORAL | Status: DC
  Administered 2023-07-19 – 2023-07-20 (×2): 80 mg via ORAL
  Filled 2023-07-19 (×3): qty 1

## 2023-07-19 NOTE — Plan of Care (Signed)

## 2023-07-19 NOTE — Progress Notes (Signed)
Progress Note   Patient: Kristy Archer ZOX:[id number] DOB: 04-04-1941 DOA: 07/13/2023     5 DOS: the patient was seen and examined on 07/19/2023   Brief hospital course: 82 y.o. 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 10/25: NG tube to low intermittent suction.  Daily KUB.  Palliative care consult 10/26: NG tube removed, clear liquid diet started 10/27: Advanced to full liquid diet 10/28: Added Protonix, multivitamins and Ensure.  Maintain on full liquid diet for now due to mild abdominal pain per surgery  Assessment and Plan: * SBO (small bowel obstruction) (HCC) Recurrent nausea with noted SBO on CT imaging  Noted prior GI/surgical history including esophagectomy and gastric pull through in 2009, exploratory laparotomy and LOA in 2015, as well as appendectomy and cholecystectomy  Continue antiemetics, pain control. NG tube removed and tolerated clear liquid diet yesterday Considering her having mild discomfort in the right lower quadrant surgical team recommends to keep her on full liquid diet for now  Hypokalemia- Due to GI losses. Repleted and resolved  Troponin elevated- Noncardiac, due to supply/demand ischemia EKG unremarkable. Last admission for chest pain - evaluated with TTE and nuclear stress test, both unremarkable.  Echo last month - EF 75%, gr 1 dd noted.  Type 2 diabetes mellitus without complications (HCC) A1C  5.8 Blood sugar better. She does have intermittent hypoglycemia episode although she remains asymptomatic.  Hopefully with her eating oral her sugar will improve.  Essential hypertension Continue  amlodipine  Esophageal dysphagia Noted prior history of esophagectomy and multiple esophageal dilatations in the past + concurrent SBO s/p NGT placement  Outpatient follow up with GI.  Chronic atrial fibrillation, unspecified (HCC) Rate controlled at present  Not on [facility]. Continue telemetry monitoring.  Severe malnutrition- BMI 18.5 Dietician evaluation.   Pressure Injury 07/14/23 Coccyx red, moist, top layer skin gone in several areas.     Code Status: Full Code  Subjective: Mild lower abdominal pain.  Tolerating full liquid diet  Physical Exam: Vitals:   07/18/23 1644 07/19/23 0006 07/19/23 0740 07/19/23 0840  BP: (!) 164/88 121/71 126/84   Pulse: 83 93 72   Resp: 16  17   Temp: 98 F (36.7 C) (!) 97.5 F (36.4 C) (!) 97.5 F (36.4 C) 97.7 F (36.5 C)  TempSrc:  Oral Oral Oral  SpO2: 100% 100% 100%   Weight:      Height:        General - Elderly Caucasian ill female, in distress due to nausea, abdominal discomfort.  hard of hearing. HEENT - PERRLA, EOMI, atraumatic head Lung - Clear, no rales, rhonchi no wheezes. Heart - S1, S2 heard, no murmurs, rubs, no pedal edema. Abdomen - Soft, lower right quad tender, non distended, bowel sounds positive Neuro - Alert, awake and oriented, non focal exam. Hard of hearing Skin - Warm and dry.  Data Reviewed:      Latest Ref Rng & Units 07/18/2023    3:43 AM 07/17/2023    4:53 AM 07/16/2023    4:01 AM  CBC  WBC 4.0 - 10.5 K/uL 8.8  11.1  10.6   Hemoglobin 12.0 - 15.0 g/dL 81.1  91.4  78.2   Hematocrit 36.0 - 46.0 % 32.1  34.9  32.2   Platelets 150 - 400 K/uL 237  255  207       Latest Ref Rng & Units 07/18/2023    3:43 AM 07/17/2023    4:53 AM 07/16/2023    4:01 AM  BMP  Glucose 70 - 99 mg/dL 57  78  84   BUN 8 - 23 mg/dL 11  9  19    Creatinine 0.44 - 1.00 mg/dL 1.61  0.96  0.45   Sodium 135 - 145 mmol/L 133  135  133   Potassium 3.5 - 5.1 mmol/L 4.3  4.6  3.0   Chloride 98 - 111 mmol/L 99  97  97   CO2 22  - 32 mmol/L 27  30  29    Calcium 8.9 - 10.3 mg/dL 7.9  8.3  7.7    No results found.   Family Communication: Discussed with patient, daughter at bedside they understand and agree. All questions answereed.  Disposition: Status is: Inpatient Remains inpatient appropriate because: Await tolerance of diet before discharge no family at bedside  Planned Discharge Destination: Home with Home Health.  Possible discharge in next 1 to 2 days     Time spent: 35 minutes  Author: Delfino Lovett, MD 07/19/2023 2:00 PM Secure chat 7am to 7pm For on call review [email or web address].

## 2023-07-19 NOTE — Progress Notes (Signed)
Nutrition Follow-up  DOCUMENTATION CODES:   Severe malnutrition in context of chronic illness  INTERVENTION:   -Ensure Enlive po TID, each supplement provides 350 kcal and 20 grams of protein  -Magic cup TID with meals, each supplement provides 290 kcal and 9 grams of protein  -MVI with minerals daily  NUTRITION DIAGNOSIS:   Severe Malnutrition related to chronic illness (esophageal dysphagia and stricture s/p esophagectomy) as evidenced by moderate fat depletion, severe fat depletion, moderate muscle depletion, severe muscle depletion.  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  10/23- s/p NGT placement for decompression; CT reveals placement in the abdomen  10/26- advanced to clear liquid diet, NGT d/c 10/27- advanced to full liquid diet  Reviewed I/O's: +470 ml x 24 hours and -122 ml since admission   Per general surgery notes, pt is a high risk surgical candidate and no plans for surgical intervention.   Pt currently on a full liquid diet. No meal completion data available to assess. Given pt's malnutrition, would benefit from addition of oral nutrition supplements.   Medications reviewed and include phenergan.   Labs reviewed: Na: 133, CBGS: 67-108 (inpatient orders for glycemic control are 0-9 units insulin aspart every 4 hours).    Diet Order:   Diet Order             Diet full liquid Fluid consistency: Thin  Diet effective now                   EDUCATION NEEDS:   Education needs  have been addressed  Skin:  Skin Assessment: Skin Integrity Issues: Skin Integrity Issues:: Other (Comment) Other: pressure injury to coccyx  Last BM:  07/12/23  Height:   Ht Readings from Last 1 Encounters:  07/13/23 5\' 2"  (1.575 m)    Weight:   Wt Readings from Last 1 Encounters:  07/13/23 46 kg    Ideal Body Weight:  50 kg  BMI:  Body mass index is 18.55 kg/m.  Estimated Nutritional Needs:   Kcal:  1400-1600  Protein:  70-85 grams  Fluid:  > 1.4 L    Levada Schilling, RD, LDN, CDCES Registered Dietitian III Certified Diabetes Care and Education Specialist Please refer to [facility] for RD and/or RD on-call/weekend/after hours pager

## 2023-07-19 NOTE — Progress Notes (Signed)
Occupational Therapy Treatment Patient Details Name: Kristy Archer MRN: [id number] DOB: 01/26/1941 Today's Date: 07/19/2023   History of present illness Pt is an 82 y.o. 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 PMH includes afib, HTN, DM, HOH   OT comments  Pt is seated in recliner on arrival. Pleasant and agreeable to OT session. She denies pain. Pt performed STS from recliner to RW with SBA/SUP and ambulated to the bathroom using RW with SBA/CGA. Pt performed toilet transfer with BSC over top of toilet to simulate her home environment with SBA. Pt had BM requiring Min A for thoroughness of hygiene in standing, although pt able to perform most of it via lateral leans seated on toilet. She required Min A for LB dressing to don mesh panties over her feet-reports this is baseline. Pt did report weakness in BLEs and some dizziness during session, but says this is normal for her when she stands for too long. 02 on RA WFL throughout session and HR noted up to 133 upon return to bed after 3-5 minutes of rest, then decreased to 112 while NT checked. BP 107/80-per pt normally lower.  Pt returned to bed with all needs in place and will cont to require skilled acute OT services to maximize her safety and IND to return to PLOF.       If plan is discharge home, recommend the following:  A little help with walking and/or transfers;A little help with bathing/dressing/bathroom;Help with stairs or ramp for entrance;Assist for transportation;Assistance with cooking/housework   Equipment Recommendations  None recommended by OT    Recommendations for Other Services      Precautions / Restrictions Precautions Precautions: Fall Restrictions Weight Bearing Restrictions: No       Mobility Bed Mobility Overal bed mobility: Needs Assistance Bed Mobility: Sit to Supine       Sit to supine: Supervision,  HOB elevated, Used rails   General bed mobility comments: 96% on RA following mobility to the bathroom and back    Transfers Overall transfer level: Needs assistance Equipment used: Rolling walker (2 wheels) Transfers: Sit to/from Stand Sit to Stand: Supervision           General transfer comment: SUP/SBA from toilet and recliner during session and CGA/SBA for mobility 2/2 weakness and dizziness     Balance Overall balance assessment: Needs assistance Sitting-balance support: Feet supported, No upper extremity supported Sitting balance-Leahy Scale: Good     Standing balance support: Bilateral upper extremity supported, During functional activity Standing balance-Leahy Scale: Fair Standing balance comment: no LOB, but limited by fatigue/weakness                           ADL either performed or assessed with clinical judgement   ADL Overall ADL's : Needs assistance/impaired                     Lower Body Dressing: Minimal assistance;Sit to/from stand   Toilet Transfer: Comfort height toilet;Ambulation;Rolling walker (2 wheels);Contact guard assist   Toileting- Clothing Manipulation and Hygiene: Sitting/lateral lean;Minimal assistance;Sit to/from stand Toileting - Clothing Manipulation Details (indicate cue type and reason): Min A for thoroughness in standing     Functional mobility during ADLs: Contact guard assist;Supervision/safety General ADL Comments: dizziness and weakness with increased standing time noted today    Extremity/Trunk Assessment Upper Extremity Assessment Upper Extremity Assessment: Overall WFL for  tasks assessed   Lower Extremity Assessment Lower Extremity Assessment: Generalized weakness        Vision       Perception     Praxis      Cognition Arousal: Alert Behavior During Therapy: WFL for tasks assessed/performed Overall Cognitive Status: Within Functional Limits for tasks assessed                                           Exercises      Shoulder Instructions       General Comments      Pertinent Vitals/ Pain       Pain Assessment Pain Assessment: No/denies pain  Home Living                                          Prior Functioning/Environment              Frequency  Min 1X/week        Progress Toward Goals  OT Goals(current goals can now be found in the care plan section)  Progress towards OT goals: Progressing toward goals  Acute Rehab OT Goals Patient Stated Goal: return home OT Goal Formulation: With patient/family Time For Goal Achievement: 07/29/23 Potential to Achieve Goals: Good  Plan      Co-evaluation                 AM-PAC OT "6 Clicks" Daily Activity     Outcome Measure   Help from another person eating meals?: None Help from another person taking care of personal grooming?: None Help from another person toileting, which includes using toliet, bedpan, or urinal?: A Little Help from another person bathing (including washing, rinsing, drying)?: A Little Help from another person to put on and taking off regular upper body clothing?: None Help from another person to put on and taking off regular lower body clothing?: A Little 6 Click Score: 21    End of Session Equipment Utilized During Treatment: Rolling walker (2 wheels)  OT Visit Diagnosis: Muscle weakness (generalized) (M62.81)   Activity Tolerance Patient tolerated treatment well   Patient Left in bed;with call bell/phone within reach;with family/visitor present   Nurse Communication Mobility status        Time: 1351-1426 OT Time Calculation (min): 35 min  Charges: OT General Charges $OT Visit: 1 Visit OT Treatments $Self Care/Home Management : 23-37 mins  Karigan Cloninger, OTR/L  07/19/23, 4:11 PM  Isadore Palecek E Shawnta Schlegel 07/19/2023, 4:08 PM

## 2023-07-19 NOTE — Progress Notes (Signed)
07/19/2023  Subjective: No acute events overnight.  Patient denies nausea, but currently also denies any flatus or other bowel movement.  Reports only mild discomfort in RLQ, but reports she continues to feel better.  Vital signs: Temp:  [97.5 F (36.4 C)-98 F (36.7 C)] 97.5 F (36.4 C) (10/28 0740) Pulse Rate:  [72-93] 72 (10/28 0740) Resp:  [16-17] 17 (10/28 0740) BP: (121-164)/(71-88) 126/84 (10/28 0740) SpO2:  [100 %] 100 % (10/28 0740)   Intake/Output: 10/27 0701 - 10/28 0700 In: 470 [P.O.:420; IV Piggyback:50] Out: -  Last BM Date : 07/12/23  Physical Exam: Constitutional:  No acute distress Abdomen:  soft, non-distended, with mild discomfort in RLQ.  No diffuse tenderness.  Labs:  Recent Labs    07/17/23 0453 07/18/23 0343  WBC 11.1* 8.8  HGB 11.2* 10.1*  HCT 34.9* 32.1*  PLT 255 237   Recent Labs    07/17/23 0453 07/18/23 0343  NA 135 133*  K 4.6 4.3  CL 97* 99  CO2 30 27  GLUCOSE 78 57*  BUN 9 11  CREATININE 0.50 0.52  CALCIUM 8.3* 7.9*   No results for input(s): "LABPROT", "INR" in the last 72 hours.  Imaging: No results found.  Assessment/Plan: This is a 82 y.o. female with SBO.  --Patient is overall improved compared to couple days ago.  She has been tolerating full liquids and does not appear to have had any issues with her esophageal stricture.   --Since she reports no other bowel movement and still having some mild RLQ discomfort, would keep her on full liquids for now just in case. --No urgent surgical procedures needed at this point.   I spent 35 minutes dedicated to the care of this patient on the date of this encounter to include pre-visit review of records, face-to-face time with the patient discussing diagnosis and management, and any post-visit coordination of care.  Howie Ill, MD [facility]

## 2023-07-19 NOTE — Progress Notes (Signed)
Physical Therapy Treatment Patient Details Name: Kristy Archer MRN: [id number] DOB: [birth year] Today's Date: 07/19/2023   History of Present Illness Pt is an 82 y.o. 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 PMH includes afib, HTN, DM, HOH    PT Comments  Pt alert and oriented, denies pain t/o session, but limited by fatigue. Pt completed supine > sit with supervision and bed rails, placed pt on room air 2/2 baseline (RN notified) and O2 98% sitting EOB. STS with supervision and RW and only able to amb 25' (with CGA and RW) 2/2 endurance deficits. O2 stayed above 95% t/o session, and pt left sitting in recliner with all needs met and on room air with RN's approval. Pt would continue to benefit from continued PT after d/c to address functional impairments.   If plan is discharge home, recommend the following: A little help with bathing/dressing/bathroom;A little help with walking and/or transfers;Assist for transportation;Help with stairs or ramp for entrance;Direct supervision/assist for medications management   Can travel by private vehicle      yes  Equipment Recommendations  None recommended by PT    Recommendations for Other Services       Precautions / Restrictions Precautions Precautions: Fall Restrictions Weight Bearing Restrictions: No     Mobility  Bed Mobility Overal bed mobility: Needs Assistance Bed Mobility: Supine to Sit     Supine to sit: Supervision, Used rails, HOB elevated     General bed mobility comments: placed pt on room air 2/2 baseline O2 needs while sitting EOB (98%)    Transfers Overall transfer level: Needs assistance Equipment used: Rolling walker (2 wheels) Transfers: Sit to/from Stand Sit to Stand: Supervision                Ambulation/Gait Ambulation/Gait assistance: Contact guard assist Gait Distance (Feet): 25 Feet Assistive  device: Rolling walker (2 wheels) Gait Pattern/deviations: Trunk flexed, Decreased stride length, Step-through pattern Gait velocity: decreased     General Gait Details: limited by cardiovascular fatigue; "I feel weak," SpO2 monitored t/o (~98% on RA)   Stairs             Wheelchair Mobility     Tilt Bed    Modified Rankin (Stroke Patients Only)       Balance Overall balance assessment: Needs assistance Sitting-balance support: Feet supported, No upper extremity supported Sitting balance-Leahy Scale: Good Sitting balance - Comments: good seated balance at EOB   Standing balance support: Bilateral upper extremity supported, During functional activity Standing balance-Leahy Scale: Fair Standing balance comment: no LOB, but limited by fatigue                            Cognition Arousal: Alert Behavior During Therapy: WFL for tasks assessed/performed Overall Cognitive Status: Within Functional Limits for tasks assessed                                          Exercises      General Comments        Pertinent Vitals/Pain Pain Assessment Pain Assessment: No/denies pain    Home Living                          Prior Function  PT Goals (current goals can now be found in the care plan section) Acute Rehab PT Goals Patient Stated Goal: go home PT Goal Formulation: With patient Potential to Achieve Goals: Good Progress towards PT goals: Progressing toward goals    Frequency    Min 1X/week      PT Plan      Co-evaluation              AM-PAC PT "6 Clicks" Mobility   Outcome Measure  Help needed turning from your back to your side while in a flat bed without using bedrails?: A Little Help needed moving from lying on your back to sitting on the side of a flat bed without using bedrails?: A Little Help needed moving to and from a bed to a chair (including a wheelchair)?: A Little Help needed  standing up from a chair using your arms (e.g., wheelchair or bedside chair)?: A Little Help needed to walk in hospital room?: A Little Help needed climbing 3-5 steps with a railing? : A Little 6 Click Score: 18    End of Session Equipment Utilized During Treatment: Gait belt Activity Tolerance: Patient limited by fatigue Patient left: in chair;with call bell/phone within reach;with chair alarm set Nurse Communication: Mobility status;Other (comment) (O2 stats) PT Visit Diagnosis: Other abnormalities of gait and mobility (R26.89);Muscle weakness (generalized) (M62.81);Difficulty in walking, not elsewhere classified (R26.2)     Time: 4098-1191 PT Time Calculation (min) (ACUTE ONLY): 14 min  Charges:    $Therapeutic Activity: 8-22 mins PT General Charges $$ ACUTE PT VISIT: 1 Visit                       Shauna Hugh, SPT 07/19/2023, 1:31 PM

## 2023-07-20 ENCOUNTER — Inpatient Hospital Stay: Payer: Medicare HMO

## 2023-07-20 DIAGNOSIS — R1030 Lower abdominal pain, unspecified: Principal | ICD-10-CM

## 2023-07-20 DIAGNOSIS — R079 Chest pain, unspecified: Secondary | ICD-10-CM | POA: Diagnosis not present

## 2023-07-20 DIAGNOSIS — K56609 Unspecified intestinal obstruction, unspecified as to partial versus complete obstruction: Secondary | ICD-10-CM | POA: Diagnosis not present

## 2023-07-20 DIAGNOSIS — E43 Unspecified severe protein-calorie malnutrition: Secondary | ICD-10-CM | POA: Diagnosis not present

## 2023-07-20 HISTORY — DX: Lower abdominal pain, unspecified: R10.30

## 2023-07-20 LAB — CBC
HCT: 34.9 % — ABNORMAL LOW (ref 36.0–46.0)
Hemoglobin: 11 g/dL — ABNORMAL LOW (ref 12.0–15.0)
MCH: 25.1 pg — ABNORMAL LOW (ref 26.0–34.0)
MCHC: 31.5 g/dL (ref 30.0–36.0)
MCV: 79.7 fL — ABNORMAL LOW (ref 80.0–100.0)
Platelets: 247 10*3/uL (ref 150–400)
RBC: 4.38 MIL/uL (ref 3.87–5.11)
RDW: 16.6 % — ABNORMAL HIGH (ref 11.5–15.5)
WBC: 8.6 10*3/uL (ref 4.0–10.5)
nRBC: 0 % (ref 0.0–0.2)

## 2023-07-20 LAB — GLUCOSE, CAPILLARY
Glucose-Capillary: 141 mg/dL — ABNORMAL HIGH (ref 70–99)
Glucose-Capillary: 65 mg/dL — ABNORMAL LOW (ref 70–99)
Glucose-Capillary: 80 mg/dL (ref 70–99)
Glucose-Capillary: 89 mg/dL (ref 70–99)
Glucose-Capillary: 92 mg/dL (ref 70–99)
Glucose-Capillary: 97 mg/dL (ref 70–99)

## 2023-07-20 LAB — BASIC METABOLIC PANEL
Anion gap: 8 (ref 5–15)
BUN: 9 mg/dL (ref 8–23)
CO2: 29 mmol/L (ref 22–32)
Calcium: 8.4 mg/dL — ABNORMAL LOW (ref 8.9–10.3)
Chloride: 98 mmol/L (ref 98–111)
Creatinine, Ser: 0.47 mg/dL (ref 0.44–1.00)
GFR, Estimated: >60 mL/min (ref >60)
Glucose, Bld: 76 mg/dL (ref 70–99)
Potassium: 4 mmol/L (ref 3.5–5.1)
Sodium: 135 mmol/L (ref 135–145)

## 2023-07-20 MED ORDER — DEXTROSE 50 % IV SOLN
1.0000 | INTRAVENOUS | Status: DC | PRN
  Administered 2023-07-20: 50 mL via INTRAVENOUS
  Filled 2023-07-20: qty 50

## 2023-07-20 MED ORDER — ORAL CARE MOUTH RINSE: 15.0000 mL | OROMUCOSAL | Status: DC | PRN

## 2023-07-20 MED ORDER — SERTRALINE HCL 50 MG PO TABS
100.0000 mg | ORAL_TABLET | Freq: Every day | ORAL | Status: DC
  Administered 2023-07-20: 100 mg via ORAL

## 2023-07-20 MED ORDER — PROMETHAZINE HCL 25 MG PO TABS
25.0000 mg | ORAL_TABLET | ORAL | Status: AC
  Administered 2023-07-20: 25 mg via ORAL
  Filled 2023-07-20: qty 1

## 2023-07-20 NOTE — Progress Notes (Signed)
[facility] Liaison Note:   (new referral for outpatient palliative services) Notified by [facility], Jonetta Speak, RN,   of patient/family request for [facility] Palliative Care services at home after discharge.  Referral submitted today.    Please call with any hospice or outpatient palliative care related questions.  Thank you for the opportunity to participate in this patient's care.  Redge Gainer, [facility] Liaison [phone]

## 2023-07-20 NOTE — Discharge Summary (Signed)
Physician Discharge Summary   Patient: Kristy Archer MRN: [id number] DOB: 1941-09-08  Admit date:     07/13/2023  Discharge date: 07/20/23  Discharge Physician: Delfino Lovett   PCP: Hortencia Conradi, NP   Recommendations at discharge:    F/up with outpt providers as requested  Discharge Diagnoses: Principal Problem:   SBO (small bowel obstruction) (HCC) Active Problems:   Chest pain   Chronic atrial fibrillation, unspecified (HCC)   Esophageal dysphagia   Essential hypertension   Type 2 diabetes mellitus without complications (HCC)   Protein-calorie malnutrition, severe   Lower abdominal pain  Hospital Course: Assessment and Plan:  82 y.o. 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 10/25: NG tube to low intermittent suction.  Daily KUB.  Palliative care consult 10/26: NG tube removed, clear liquid diet started 10/27: Advanced to full liquid diet 10/28: Added Protonix, multivitamins and Ensure.  Maintain on full liquid diet for now due to mild abdominal pain per surgery   Assessment and Plan: * SBO (small bowel obstruction) (HCC) Recurrent nausea with noted SBO on CT imaging  Noted prior GI/surgical history including esophagectomy and gastric pull through in 2009, exploratory laparotomy and LOA in 2015, as well as appendectomy and cholecystectomy  Improved with conservative mgmt as recommended by surgery   Hypokalemia- Due to GI losses. Repleted and resolved   Troponin elevated- Noncardiac, due to supply/demand ischemia EKG unremarkable. Last admission for chest pain - evaluated with TTE and nuclear stress test, both unremarkable.   Echo last month - EF 75%, gr 1 dd noted.   Type 2 diabetes mellitus without complications (HCC) A1C  5.8   Essential hypertension Continue amlodipine   Esophageal dysphagia Noted prior history of esophagectomy and multiple esophageal dilatations in the past + concurrent SBO s/p NGT placement  Outpatient follow up with GI at [facility] as schedule don 10/31   Chronic atrial fibrillation, unspecified (HCC) Rate controlled at present  Not on AC   Severe malnutrition- BMI 18.5          Consultants: Surgery Disposition: Home Diet recommendation:  Discharge Diet Orders (From admission, onward)     Start     Ordered   07/20/23 0000  Diet - low sodium heart healthy        07/20/23 1417           Carb modified diet DISCHARGE MEDICATION: Allergies as of 07/20/2023       Reactions   Codeine Anaphylaxis   Other reaction(s): Unknown Can take morphine per patient; also tolerates hydromorphone   Linezolid    Other reaction(s): Angioedema (ALLERGY/intolerance)   Sulfa Antibiotics Itching        Medication List     STOP taking these medications    omeprazole 40 MG capsule Commonly known as: PRILOSEC       TAKE these medications    aminophylline injection Inject 3 mLs (75 mg total) into the vein once for 1 dose.   amLODipine 5 MG tablet Commonly known as: NORVASC Take 1 tablet (5 mg total) by mouth daily.   aspirin EC 81 MG tablet Take 81 mg by mouth daily.   cyanocobalamin 1000 MCG tablet Take 1,000 mcg by mouth daily.   estradiol 0.1 MG/GM vaginal  cream Commonly known as: ESTRACE Estrogen Cream Instruction Discard applicator Apply pea sized amount to tip of finger to urethra before bed. Wash hands well after application. Use Monday, Wednesday and Friday   famotidine 40 MG tablet Commonly known as: PEPCID Take 40 mg by mouth at bedtime.   levothyroxine 88 MCG tablet Commonly known as: SYNTHROID Take 88 mcg by mouth every morning.   meclizine  25 MG tablet Commonly known as: ANTIVERT Take 25 mg by mouth 3 (three) times daily as needed for dizziness.   metoCLOPramide 10 MG tablet Commonly known as: REGLAN Take 10 mg by mouth 2 (two) times daily before a meal.   nitrofurantoin (macrocrystal-monohydrate) 100 MG capsule Commonly known as: MACROBID Take 1 capsule (100 mg total) by mouth daily.   oxyCODONE-acetaminophen 10-325 MG tablet Commonly known as: PERCOCET Take 1 tablet by mouth 4 (four) times daily as needed for pain.   sertraline 100 MG tablet Commonly known as: ZOLOFT Take 100 mg by mouth daily.   simvastatin 20 MG tablet Commonly known as: ZOCOR Take 20 mg by mouth every evening.   sotalol 80 MG tablet Commonly known as: BETAPACE Take 80 mg by mouth every 12 (twelve) hours.   sucralfate 1 g tablet Commonly known as: CARAFATE Take 1 g by mouth 4 (four) times daily.   Voquezna 20 MG Tabs Generic drug: Vonoprazan Fumarate Take 1 tablet by mouth daily.        Follow-up Information     Hortencia Conradi, NP. Schedule an appointment as soon as possible for a visit in 1 week(s).   Why: [facility] Discharge F/UP Contact information: [address] [place] [address] [phone]         Hortencia Conradi, NP Follow up on 07/22/2023.   Why: at [address] information: [address] [place] [address] [phone]                Discharge Exam: Ceasar Mons Weights   07/13/23 1956  Weight: 46 kg   General - Elderly Caucasian ill female, in distress due to nausea, abdominal discomfort.  hard of hearing. HEENT - PERRLA, EOMI, atraumatic head Lung - Clear, no rales, rhonchi no wheezes. Heart - S1, S2 heard, no murmurs, rubs, no pedal edema. Abdomen - Soft, lower right quad tender, non distended, bowel sounds positive Neuro - Alert, awake and oriented, non focal exam. Hard of hearing Skin - Warm and dry.  Condition at discharge: fair  The results of significant diagnostics  from this hospitalization (including imaging, microbiology, ancillary and laboratory) are listed below for reference.   Imaging Studies: DG Abd 2 Views  Result Date: 07/20/2023 CLINICAL DATA:  Abdominal pain EXAM: ABDOMEN - 2 VIEW COMPARISON:  07/17/2023 FINDINGS: Two supine frontal views of the abdomen and pelvis are obtained. The enteric catheter identified previously is no longer present. No bowel obstruction or ileus. No masses or abnormal calcifications. Embolic coils left hemipelvis unchanged. Lung bases are clear. Diffuse thoracolumbar spondylosis. Unremarkable left hip arthroplasty. IMPRESSION: 1. Unremarkable bowel gas pattern. Electronically Signed   By: Sharlet Salina M.D.   On: 07/20/2023 15:43   DG ABD ACUTE 2+V W 1V CHEST  Result Date: 07/17/2023 CLINICAL DATA:  [id number] SBO (small bowel obstruction) (HCC) [id number] EXAM: DG ABDOMEN ACUTE WITH 1 VIEW CHEST COMPARISON:  July 16, 2023 FINDINGS: Enteric tube tip and side port project over the inferior thorax in the expected area of the gastric pull-through. Mild diffuse gaseous distension of loops  of bowel in the abdomen without dilation. Air is visualized in the rectum. Osteopenia. Status post LEFT hip arthroplasty. LEFT pelvic embolization coils. Chronic elevation of the RIGHT hemidiaphragm. IMPRESSION: 1. Enteric tube tip and side port project over the expected area of the gastric pull-through. 2. Mild diffuse gaseous distension of loops of bowel in the abdomen without dilation. Electronically Signed   By: Meda Klinefelter M.D.   On: 07/17/2023 13:11   DG ABD ACUTE 2+V W 1V CHEST  Result Date: 07/16/2023 CLINICAL DATA:  [id number] SBO (small bowel obstruction) (HCC) [id number]. EXAM: DG ABDOMEN ACUTE WITH 1 VIEW CHEST COMPARISON:  Abdominal radiograph 07/15/2023. FINDINGS: Enteric tube tip projects over the distal esophagus. There is no evidence of dilated bowel loops or free intraperitoneal air. No radiopaque calculi or other significant  radiographic abnormality is seen. Heart size and mediastinal contours are within normal limits. Both lungs are clear. Cholecystectomy clips in the right upper quadrant. Embolization coils in the left pelvis. Prior left total hip arthroplasty. Degenerative changes of the spine and right hip joint. IMPRESSION: 1. Enteric tube tip projects over the distal esophagus. Recommend advancement. 2. Nonobstructive bowel gas pattern. 3. No acute cardiopulmonary disease. Electronically Signed   By: Orvan Falconer M.D.   On: 07/16/2023 09:57   DG Abd 2 Views  Result Date: 07/15/2023 CLINICAL DATA:  82 year old female with suspected small bowel obstruction. EXAM: ABDOMEN - 2 VIEW COMPARISON:  CT Abdomen and Pelvis yesterday. FINDINGS: Portable AP upright and supine views at 0814 hours. Enteric tube terminates at the distal esophagus in the midline just above the diaphragm. External portion of the tube loops over the epigastrium. No pneumoperitoneum.  Stable lung bases. Persistent nephrograms, and excreted IV contrast in the urinary bladder. Stable cholecystectomy clips. Left pelvic, hypogastric embolization coils. Bowel-gas pattern has improved since the scout view yesterday, not completely normalized but largely nonobstructed now. Osteopenia. Stable visualized osseous structures. Left hip arthroplasty. IMPRESSION: 1. Enteric tube terminates at the distal esophagus. Advanced at least 12 cm to ensure side hole placement within the stomach. 2. Bowel-gas pattern has improved since yesterday, suggesting resolving small bowel obstruction. No pneumoperitoneum. 3. Persistent nephrogram, query renal insufficiency. Electronically Signed   By: Odessa Fleming M.D.   On: 07/15/2023 10:29   CT Angio Chest Pulmonary Embolism (PE) W or WO Contrast  Result Date: 07/14/2023 CLINICAL DATA:  Chest pain. EXAM: CT ANGIOGRAPHY CHEST WITH CONTRAST TECHNIQUE: Multidetector CT imaging of the chest was performed using the standard protocol during bolus  administration of intravenous contrast. Multiplanar CT image reconstructions and MIPs were obtained to evaluate the vascular anatomy. RADIATION DOSE REDUCTION: This exam was performed according to the departmental dose-optimization program which includes automated exposure control, adjustment of the mA and/or kV according to patient size and/or use of iterative reconstruction technique. CONTRAST:  75mL OMNIPAQUE IOHEXOL 350 MG/ML SOLN COMPARISON:  March 08, 2023. FINDINGS: Cardiovascular: Satisfactory opacification of the pulmonary arteries to the segmental level. No evidence of pulmonary embolism. Normal heart size. No pericardial effusion. Mediastinum/Nodes: Status post gastric pull-through and esophageal resection. No definite adenopathy is noted. Feeding tube is seen passing through chest into abdomen. Lungs/Pleura: No pneumothorax is noted. Mild bilateral posterior basilar subsegmental atelectasis or infiltrates are noted. Small right pleural effusion is noted. Upper Abdomen: No acute abnormality. Musculoskeletal: No chest wall abnormality. No acute or significant osseous findings. Review of the MIP images confirms the above findings. IMPRESSION: No definite evidence of pulmonary embolism. Status post esophagectomy and gastric pull-through. Mild bilateral  posterior basilar subsegmental atelectasis or infiltrates are noted. Aortic Atherosclerosis (ICD10-I70.0). Electronically Signed   By: Lupita Raider M.D.   On: 07/14/2023 19:55   DG Abdomen 1 View  Result Date: 07/14/2023 CLINICAL DATA:  eval NGT placement. EXAM: ABDOMEN - 1 VIEW COMPARISON:  Abdominal radiograph 07/14/2023 at 0554 hours. FINDINGS: 0601 hours. Endotracheal tube tip now projects over the lower esophagus, near the GE junction. IMPRESSION: Endotracheal tube tip now projects over the lower esophagus, near the GE junction. Recommend advancement. Electronically Signed   By: Orvan Falconer M.D.   On: 07/14/2023 08:29   DG Abdomen 1  View  Result Date: 07/14/2023 CLINICAL DATA:  Nasogastric tube placement EXAM: ABDOMEN - 1 VIEW COMPARISON:  Earlier film of the same day FINDINGS: Nasogastric tube into the decompressed stomach, proximal side hole near the GE junction. No other interval change. IMPRESSION: Nasogastric tube into the decompressed stomach. Electronically Signed   By: Corlis Leak M.D.   On: 07/14/2023 08:13   DG Abd Portable 1 View  Result Date: 07/14/2023 CLINICAL DATA:  NG tube insertion EXAM: PORTABLE ABDOMEN - 1 VIEW COMPARISON:  07/14/2023 FINDINGS: Interval placement of a nasogastric tube. The tip and side port are well below the level of the hemidiaphragms. Tip is in the right upper quadrant of the abdomen in the expected location of the proximal duodenum. Cholecystectomy clips noted in the right upper quadrant. Contrast material noted within the collecting systems of both kidneys. IMPRESSION: Nasogastric tube tip and side port are well below the level of the hemidiaphragms. Electronically Signed   By: Signa Kell M.D.   On: 07/14/2023 05:10   CT ABDOMEN PELVIS W CONTRAST  Result Date: 07/14/2023 CLINICAL DATA:  Acute nonlocalized abdominal pain. Nausea. Last bowel movement was 3 days ago. EXAM: CT ABDOMEN AND PELVIS WITH CONTRAST TECHNIQUE: Multidetector CT imaging of the abdomen and pelvis was performed using the standard protocol following bolus administration of intravenous contrast. RADIATION DOSE REDUCTION: This exam was performed according to the departmental dose-optimization program which includes automated exposure control, adjustment of the mA and/or kV according to patient size and/or use of iterative reconstruction technique. CONTRAST:  75mL OMNIPAQUE IOHEXOL 300 MG/ML  SOLN COMPARISON:  CT pelvis 04/27/2021.  CT abdomen and pelvis 07/19/2019 FINDINGS: Lower chest: Emphysematous changes, scarring, and bronchiectasis in the lung bases. Postoperative changes with esophagectomy and gastric pull-through  procedure. Dilated fluid-filled esophagogastric structure similar to prior studies. No change since prior study. Hepatobiliary: No focal liver abnormality is seen. Status post cholecystectomy. No biliary dilatation. Pancreas: Unremarkable. No pancreatic ductal dilatation or surrounding inflammatory changes. Spleen: Normal in size without focal abnormality. Adrenals/Urinary Tract: Adrenal glands are unremarkable. Kidneys are normal, without renal calculi, focal lesion, or hydronephrosis. Bladder is unremarkable. Stomach/Bowel: Postoperative changes with esophagectomy and gastric pull-through. Diffusely fluid-filled distended small bowel. No discrete wall thickening. Terminal ileum is decompressed with transition zone in the right lower quadrant. Changes are likely to represent small bowel obstruction. Colon is partially stool filled but mostly decompressed. Colonic diverticulosis without evidence of acute diverticulitis. Appendix is not visualized. Vascular/Lymphatic: No significant vascular findings are present. No enlarged abdominal or pelvic lymph nodes. Reproductive: Status post hysterectomy. No adnexal masses. Other: Small amount of free fluid around the liver, likely ascites. No free air. Musculoskeletal: Previous left hip arthroplasty. Degenerative changes in the spine and right hip. No acute bony abnormalities. IMPRESSION: 1. Diffusely dilated fluid-filled small bowel with transition zone in the right lower quadrant consistent with small-bowel obstruction. 2.  Postoperative changes with esophagectomy and gastric pull-through procedure, unchanged. 3. Emphysematous changes, scarring, and bronchiectasis in the lung bases. 4. Small amount of free fluid around the liver. Electronically Signed   By: Burman Nieves M.D.   On: 07/14/2023 02:21   DG Chest 1 View  Result Date: 07/13/2023 CLINICAL DATA:  Abdominal pain. EXAM: CHEST  1 VIEW COMPARISON:  06/06/2023 FINDINGS: Elevation of the right hemidiaphragm. No  change since prior study. Heart size and pulmonary vascularity are normal. No airspace disease or consolidation. No pleural effusions. Mediastinal contours appear intact. Soft tissue shadow projecting over the right chest. Surgical clips in the right upper quadrant. Old posttraumatic or postoperative rib deformities in the right upper chest. Degenerative changes in the spine and shoulders. IMPRESSION: Elevation of the right hemidiaphragm is unchanged. No evidence of active pulmonary disease. Electronically Signed   By: Burman Nieves M.D.   On: 07/13/2023 22:20    Microbiology: Results for orders placed or performed during the hospital encounter of 11/24/21  Resp Panel by RT-PCR (Flu A&B, Covid) Nasopharyngeal Swab     Status: None   Collection Time: 11/24/21  8:35 AM   Specimen: Nasopharyngeal Swab; Nasopharyngeal(NP) swabs in vial transport medium  Result Value Ref Range Status   SARS Coronavirus 2 by RT PCR NEGATIVE NEGATIVE Final    Comment: (NOTE) SARS-CoV-2 target nucleic acids are NOT DETECTED.  The SARS-CoV-2 RNA is generally detectable in upper respiratory specimens during the acute phase of infection. The lowest concentration of SARS-CoV-2 viral copies this assay can detect is 138 copies/mL. A negative result does not preclude SARS-Cov-2 infection and should not be used as the sole basis for treatment or other patient management decisions. A negative result may occur with  improper specimen collection/handling, submission of specimen other than nasopharyngeal swab, presence of viral mutation(s) within the areas targeted by this assay, and inadequate number of viral copies(<138 copies/mL). A negative result must be combined with clinical observations, patient history, and epidemiological information. The expected result is Negative.  Fact Sheet for Patients:  BloggerCourse.com  Fact Sheet for Healthcare Providers:   SeriousBroker.it  This test is no t yet approved or cleared by the [place] FDA and  has been authorized for detection and/or diagnosis of SARS-CoV-2 by FDA under an Emergency Use Authorization (EUA). This EUA will remain  in effect (meaning this test can be used) for the duration of the COVID-19 declaration under Section 564(b)(1) of the Act, 21 U.S.C.section 360bbb-3(b)(1), unless the authorization is terminated  or revoked sooner.       Influenza A by PCR NEGATIVE NEGATIVE Final   Influenza B by PCR NEGATIVE NEGATIVE Final    Comment: (NOTE) The Xpert Xpress SARS-CoV-2/FLU/RSV plus assay is intended as an aid in the diagnosis of influenza from Nasopharyngeal swab specimens and should not be used as a sole basis for treatment. Nasal washings and aspirates are unacceptable for Xpert Xpress SARS-CoV-2/FLU/RSV testing.  Fact Sheet for Patients: BloggerCourse.com  Fact Sheet for Healthcare Providers: SeriousBroker.it  This test is not yet approved or cleared by the [place] FDA and has been authorized for detection and/or diagnosis of SARS-CoV-2 by FDA under an Emergency Use Authorization (EUA). This EUA will remain in effect (meaning this test can be used) for the duration of the COVID-19 declaration under Section 564(b)(1) of the Act, 21 U.S.C. section 360bbb-3(b)(1), unless the authorization is terminated or revoked.  Performed at [facility], [address], [place], [place] [address]  Labs: CBC: Recent Labs  Lab 07/15/23 0246 07/16/23 0401 07/17/23 0453 07/18/23 0343 07/20/23 0426  WBC 15.6* 10.6* 11.1* 8.8 8.6  HGB 11.0* 10.2* 11.2* 10.1* 11.0*  HCT 33.8* 32.2* 34.9* 32.1* 34.9*  MCV 79.2* 80.5 79.7* 81.7 79.7*  PLT 251 207 255 237 247   Basic Metabolic Panel: Recent Labs  Lab 07/15/23 0246 07/16/23 0401 07/17/23 0453 07/18/23 0343 07/20/23 0426  NA 132*  133* 135 133* 135  K 3.5 3.0* 4.6 4.3 4.0  CL 98 97* 97* 99 98  CO2 27 29 30 27 29   GLUCOSE 118* 84 78 57* 76  BUN 24* 19 9 11 9   CREATININE 1.11* 0.54 0.50 0.52 0.47  CALCIUM 7.6* 7.7* 8.3* 7.9* 8.4*   Liver Function Tests: Recent Labs  Lab 07/15/23 0246  AST 20  ALT 10  ALKPHOS 61  BILITOT 0.6  PROT 5.8*  ALBUMIN 2.9*   CBG: Recent Labs  Lab 07/20/23 0155 07/20/23 0223 07/20/23 0415 07/20/23 0805 07/20/23 1146  GLUCAP 65* 141* 80 89 92    Discharge time spent: greater than 30 minutes.  Signed: Delfino Lovett, MD [facility] 07/20/2023

## 2023-07-20 NOTE — Progress Notes (Signed)
Attempted to return family call; no answer at this time

## 2023-07-20 NOTE — TOC Transition Note (Signed)
Transition of Care [facility]) - CM/SW Discharge Note   Patient Details  Name: Kristy Archer MRN: [id number] Date of Birth: April 02, 1941  [facility] of [facility]) CM/SW Contact:  Garret Reddish, RN Phone Number: 07/20/2023, 4:22 PM   Clinical Narrative:    Chart reviewed.  Noted that patient has orders for discharge today.    I have spoken with patient's daughter Delaney Meigs.  She informs me that patient lives at home with her.  Delaney Meigs reports that prior to admission patient was active with [facility] and she would like continue to use their services.  I have informed Kelsy with [facility] that patient would be a discharge for today and would need HH PT and OT on discharge.    I have asked Annice Pih with [facility] to accept Pallative Care referral per daughter's request.    Delaney Meigs reports that patient has  [facility], Hospital bed, and Rolling walker at home.    I have informed staff nurse of the above information.    Final next level of care: Home w Home Health Services Barriers to Discharge: No Barriers Identified   Patient Goals and CMS Choice CMS Medicare.gov Compare Post Acute Care list provided to:: Patient Represenative (must comment) Choice offered to / list presented to : Adult Children (T[place])  Discharge Placement                    Name of family member notified: [name] patient's daughter Patient and family notified of of transfer: 07/20/23  Discharge Plan and Services Additional resources added to the After Visit Summary for                  DME Arranged:  (Patient has rolling walker at home)         HH Arranged: PT, OT HH Agency: [facility] Date [facility] Agency Contacted: 07/20/23   Representative spoke with at [facility] Agency: Adelina Mings  Social Determinants of Health (SDOH) Interventions SDOH Screenings   Food Insecurity: No Food Insecurity (07/14/2023)  Housing: Low Risk  (07/14/2023)  Transportation Needs: No Transportation Needs (07/14/2023)  Utilities: Not  At Risk (07/14/2023)  Tobacco Use: Low Risk  (07/13/2023)     Readmission Risk Interventions     No data to display

## 2023-07-20 NOTE — Progress Notes (Signed)
D/C AVS completed and reviewed with pt and daughter [name] All opportunities for questions answered and clarified. IV removed. Pt will be wheeled down to car at medical mall entrance via wheelchair.

## 2023-07-20 NOTE — Progress Notes (Addendum)
07/20/2023  Subjective: Patient's RN yesterday reported the patient had a few bowel movements and thus diet was advanced to soft diet for breakfast.  However, today, the patient reports nausea and RLQ discomfort, and reports to me that she has not had any flatus or BM.    Vital signs: Temp:  [98.2 F (36.8 C)-98.7 F (37.1 C)] 98.2 F (36.8 C) (10/29 1452) Pulse Rate:  [65-124] 65 (10/29 1452) Resp:  [16-20] 16 (10/29 1452) BP: (120-132)/(62-68) 132/64 (10/29 1452) SpO2:  [97 %-98 %] 97 % (10/29 1452)   Intake/Output: No intake/output data recorded. Last BM Date : 07/19/23  Physical Exam: Constitutional: No acute distress Abdomen:  soft, non-distended, with mild discomfort in RLQ, but no peritonitis.  Labs:  Recent Labs    07/18/23 0343 07/20/23 0426  WBC 8.8 8.6  HGB 10.1* 11.0*  HCT 32.1* 34.9*  PLT 237 247   Recent Labs    07/18/23 0343 07/20/23 0426  NA 133* 135  K 4.3 4.0  CL 99 98  CO2 27 29  GLUCOSE 57* 76  BUN 11 9  CREATININE 0.52 0.47  CALCIUM 7.9* 8.4*   No results for input(s): "LABPROT", "INR" in the last 72 hours.  Imaging: DG Abd 2 Views  Result Date: 07/20/2023 CLINICAL DATA:  Abdominal pain EXAM: ABDOMEN - 2 VIEW COMPARISON:  07/17/2023 FINDINGS: Two supine frontal views of the abdomen and pelvis are obtained. The enteric catheter identified previously is no longer present. No bowel obstruction or ileus. No masses or abnormal calcifications. Embolic coils left hemipelvis unchanged. Lung bases are clear. Diffuse thoracolumbar spondylosis. Unremarkable left hip arthroplasty. IMPRESSION: 1. Unremarkable bowel gas pattern. Electronically Signed   By: Sharlet Salina M.D.   On: 07/20/2023 15:43    Assessment/Plan: This is a 82 y.o. female with SBO that's resolving.  --The patient's RN yesterday mentioned that she had a few bowel movements, but the patient today denies any flatus or BM.  KUB was obtained this morning to evaluate and there are no  dilated loops of bowel, no distention.  Unclear the source of the patient's symptoms, but assuming she's continued to have bowel function, I think it's ok to continue a soft diet and d/c home this afternoon if she's tolerating this well. --Follow up with her surgical team   I spent 35 minutes dedicated to the care of this patient on the date of this encounter to include pre-visit review of records, face-to-face time with the patient discussing diagnosis and management, and any post-visit coordination of care.  Howie Ill, MD [facility]

## 2023-07-20 NOTE — Plan of Care (Signed)

## 2023-07-20 NOTE — Progress Notes (Signed)
Pt has orders for q4 accuchecks. BG at 0000 was 63, nurse did a recheck and got the same result . Pt did not have prn orders for D50. Provider on call notified. Pt was given orange juice. Orders placed per provider. Nurse did a recheck of BG and it came up to 97. Pt is alert and oriented x 4, no complaints at this time. Will continue to monitor.

## 2023-07-20 NOTE — Care Management Important Message (Signed)
Important Message  Patient Details  Name: Kristy Archer MRN: [id number] Date of Birth: 20-Apr-1941   Important Message Given:  Yes - Medicare IM     Verita Schneiders [name] 07/20/2023, 3:28 PM

## 2023-08-26 DIAGNOSIS — N63 Unspecified lump in unspecified breast: Secondary | ICD-10-CM | POA: Insufficient documentation

## 2023-08-27 DIAGNOSIS — R918 Other nonspecific abnormal finding of lung field: Secondary | ICD-10-CM | POA: Insufficient documentation

## 2023-08-31 ENCOUNTER — Ambulatory Visit (INDEPENDENT_AMBULATORY_CARE_PROVIDER_SITE_OTHER): Payer: Medicare HMO | Admitting: Physician Assistant

## 2023-08-31 DIAGNOSIS — N390 Urinary tract infection, site not specified: Secondary | ICD-10-CM

## 2023-09-01 ENCOUNTER — Other Ambulatory Visit: Payer: Self-pay | Admitting: Family

## 2023-09-01 ENCOUNTER — Inpatient Hospital Stay
Admission: RE | Admit: 2023-09-01 | Discharge: 2023-09-01 | Disposition: A | Discharge status: Home / Self Care | Payer: Self-pay | Source: Ambulatory Visit | Attending: Family | Admitting: Family

## 2023-09-01 ENCOUNTER — Other Ambulatory Visit: Payer: Self-pay | Admitting: *Deleted

## 2023-09-01 DIAGNOSIS — Z1231 Encounter for screening mammogram for malignant neoplasm of breast: Secondary | ICD-10-CM

## 2023-09-01 DIAGNOSIS — N63 Unspecified lump in unspecified breast: Secondary | ICD-10-CM

## 2023-09-06 ENCOUNTER — Inpatient Hospital Stay: Admission: RE | Admit: 2023-09-06 | Payer: Medicare HMO | Source: Ambulatory Visit

## 2023-09-06 ENCOUNTER — Other Ambulatory Visit: Payer: Medicare HMO

## 2023-09-21 NOTE — Progress Notes (Signed)
Error

## 2023-11-05 DIAGNOSIS — I351 Nonrheumatic aortic (valve) insufficiency: Secondary | ICD-10-CM | POA: Diagnosis not present

## 2023-11-05 DIAGNOSIS — I340 Nonrheumatic mitral (valve) insufficiency: Secondary | ICD-10-CM | POA: Diagnosis not present

## 2023-12-08 ENCOUNTER — Other Ambulatory Visit: Payer: Self-pay | Admitting: Obstetrics and Gynecology

## 2023-12-14 ENCOUNTER — Ambulatory Visit (INDEPENDENT_AMBULATORY_CARE_PROVIDER_SITE_OTHER): Admitting: Vascular Surgery

## 2023-12-14 ENCOUNTER — Encounter (INDEPENDENT_AMBULATORY_CARE_PROVIDER_SITE_OTHER): Payer: Self-pay | Admitting: Vascular Surgery

## 2023-12-14 VITALS — BP 110/76 | HR 101 | Resp 18 | Ht 62.0 in | Wt 101.0 lb

## 2023-12-14 DIAGNOSIS — I82A12 Acute embolism and thrombosis of left axillary vein: Secondary | ICD-10-CM

## 2023-12-14 DIAGNOSIS — I10 Essential (primary) hypertension: Secondary | ICD-10-CM

## 2023-12-14 DIAGNOSIS — E119 Type 2 diabetes mellitus without complications: Secondary | ICD-10-CM

## 2023-12-14 DIAGNOSIS — I482 Chronic atrial fibrillation, unspecified: Secondary | ICD-10-CM

## 2023-12-14 DIAGNOSIS — K56609 Unspecified intestinal obstruction, unspecified as to partial versus complete obstruction: Secondary | ICD-10-CM | POA: Diagnosis not present

## 2023-12-14 HISTORY — DX: Acute embolism and thrombosis of left axillary vein: I82.A12

## 2023-12-14 NOTE — Assessment & Plan Note (Signed)
 Apparently with recent hospitalization.  This could have been an underlying source of why she got a new DVT.

## 2023-12-14 NOTE — Assessment & Plan Note (Signed)
 blood pressure control important in reducing the progression of atherosclerotic disease. On appropriate oral medications.

## 2023-12-14 NOTE — Assessment & Plan Note (Signed)
 Although I do not have the ultrasound, but reports she has a left upper extremity DVT that is being treated with Eliquis.  This has resulted in a good clinical response with marked improvement in the swelling in her arm.  I would generally treat her at least 3 months and then we can repeat a duplex study to assess to see if there has been resolution of the DVT or if there is still DVT present.  She has not currently had any issues with anticoagulation.  She has atrial fibrillation as well.

## 2023-12-14 NOTE — Progress Notes (Signed)
 Patient ID: Kristy Archer, female   DOB: 1941-05-02, 83 y.o.   MRN: [id number]  Chief Complaint  Patient presents with   New Patient (Initial Visit)    np. consult. Acute embolism and thrombosis of deep veins of right upper extremity. harris, miranda.    HPI Kristy Archer is a 83 y.o. female.  I am asked to see the patient by Dr. Tiburcio Pea for evaluation of left arm DVT.  The patient is a very poor historian and the history is obtained from her family member.  Apparently, she was in the hospital with a bowel blockage and prior to leaving the hospital it was noted that she had extensive left upper extremity swelling.  This was evaluated with a duplex study that I do not have access to that by report of the family demonstrated a left upper extremity DVT.  She was appropriately started on anticoagulation and the swelling has markedly reduced.  Her lower legs are not swollen but her family says she has had some swelling up around her hips and thighs.  She is very immobile.  She was started on 5 mg twice daily of Eliquis and is on that currently.  She has not had any signs of bleeding on the Eliquis.  She has extensive medical history as listed below.   Past Medical History:  Diagnosis Date   Age-related osteoporosis without current pathological fracture 12/03/2021   Chest pain    Chest pain of uncertain etiology 12/05/2021   Chronic atrial fibrillation, unspecified (HCC) 01/16/2014   Last Assessment & Plan:  Formatting of this note might be different from the original. -Home meds: Sotalol 80 mg twice daily, Xarelto 20 mg daily -Sotalol dose had to be temporarily reduced due to slight decrease in renal functions however it was returned to her home dose.  -Heart rate mostly in the 50s, QTC on EKG 11/30/18  478 msec -Xarelto had to be held on admission for insertion of chest tube    Chronic back pain 11/25/2018   Last Assessment & Plan:  Formatting of this note might be different from the original. -  Continue home pain medication  - Added miralax daily prn   Chronic pain syndrome 12/03/2021   Constipation 12/03/2018   Last Assessment & Plan:  Formatting of this note might be different from the original. 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 - History of hiatal hernia status post fundoplication in 2005.  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  History of multiple EGDs for dilatation. - S/p thoracotomy with partial gastrectomy/esophagectomy in 2008 with thoracic esophagogastric    Hypomagnesemia 12/01/2018   Last Assessment & Plan:  Formatting of this note might be different from the original. Replaced during hospitalization.   Hypothyroidism 01/16/2014   Last Assessment & Plan:  Formatting of this note might be different from the original. Continue levothyroxine 88 mcg   Irritable bowel syndrome 12/03/2021   Mild recurrent major  depression (HCC) 12/03/2021   Mixed hyperlipidemia 12/03/2021   OSA (obstructive sleep apnea) 04/16/2021   Other long term (current) drug therapy 12/03/2021   Other vitamin B12 deficiency anemias 12/03/2021   Pain in left hip 12/03/2021   Pain in right foot 12/03/2021   Pain in right knee 12/03/2021   Primary generalized (osteo)arthritis 12/03/2021   Primary osteoarthritis, right ankle and foot 12/03/2021   Recurrent major depression in full remission (HCC) 12/03/2021   Tinea unguium  12/03/2021   Type 2 diabetes mellitus without complications (HCC) 12/03/2021   Unspecified severe protein-calorie malnutrition (HCC) 12/03/2021   Urinary tract infectious disease 12/03/2021   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   Vitamin D deficiency 12/03/2021    Past Surgical History:  Procedure Laterality Date   APPENDECTOMY     CHOLECYSTECTOMY       Family History  Problem Relation Age of Onset   Diabetes Mother    Cancer Mother    Heart disease Father    Diabetes Father    Hypertension Father       Social History   Tobacco Use   Smoking status: Never    Passive exposure: Never   Smokeless tobacco: Never  Substance Use Topics   Alcohol use: Not Currently     Allergies  Allergen Reactions   Codeine Anaphylaxis    Other reaction(s): Unknown Can take morphine per patient; also tolerates hydromorphone    Linezolid     Other reaction(s): Angioedema (ALLERGY/intolerance)   Sulfa Antibiotics Itching    Current Outpatient Medications  Medication Sig Dispense Refill   amLODipine (NORVASC) 5 MG tablet Take 1 tablet (5 mg total) by mouth daily. 90 tablet 1   aspirin EC 81 MG tablet Take 81 mg by mouth daily.     cyanocobalamin 1000 MCG tablet Take 1,000 mcg by mouth daily.     ELIQUIS 5 MG TABS tablet Take 5 mg by mouth 2 (two) times daily.     estradiol (ESTRACE) 0.1 MG/GM vaginal cream Estrogen Cream Instruction Discard applicator Apply pea sized amount to tip of finger to urethra before bed. Wash hands well after application. Use Monday, Wednesday and Friday 42.5 g 11   famotidine (PEPCID) 40 MG tablet Take 40 mg by mouth at bedtime.     levothyroxine (SYNTHROID) 88 MCG tablet Take 88 mcg by mouth every morning.     meclizine (ANTIVERT) 25 MG tablet Take 25 mg by mouth 3 (three) times daily as needed for dizziness.     metoCLOPramide (REGLAN) 10 MG tablet Take 10 mg by mouth 2 (two) times daily before a meal.     nitrofurantoin, macrocrystal-monohydrate, (MACROBID)  100 MG capsule Take 1 capsule (100 mg total) by mouth daily. 90 capsule 0   omeprazole (PRILOSEC) 40 MG capsule Take 40 mg by mouth 2 (two) times daily.     oxyCODONE-acetaminophen (PERCOCET) 10-325 MG tablet Take 1 tablet by mouth 4 (four) times daily as needed for pain.     sertraline (ZOLOFT) 100 MG tablet Take 100 mg by mouth daily.     simvastatin (ZOCOR) 20 MG tablet Take 20 mg by mouth every evening.     sodium chloride 1 g tablet Take 1 g by mouth 2 (two) times daily.     sucralfate (CARAFATE) 1 g tablet Take 1 g by mouth 4 (four) times daily.     VOQUEZNA 20 MG TABS Take 1 tablet by mouth daily.     aminophylline injection Inject 3 mLs (75  mg total) into the vein once for 1 dose. (Patient not taking: Reported on 12/14/2023) 3 mL 0   sotalol (BETAPACE) 80 MG tablet Take 80 mg by mouth every 12 (twelve) hours. (Patient not taking: Reported on 12/14/2023)     No current facility-administered medications for this visit.      REVIEW OF SYSTEMS (Negative unless checked)  Constitutional: [x] Weight loss  [] Fever  [] Chills Cardiac: [] Chest pain   [] Chest pressure   [] Palpitations   [] Shortness of breath when laying flat   [] Shortness of breath at rest   [] Shortness of breath with exertion. Vascular:  [] Pain in legs with walking   [] Pain in legs at rest   [] Pain in legs when laying flat   [] Claudication   [] Pain in feet when walking  [] Pain in feet at rest  [] Pain in feet when laying flat   [x] History of DVT   [x] Phlebitis   [x] Swelling in legs   [] Varicose veins   [] Non-healing ulcers Pulmonary:   [] Uses home oxygen   [] Productive cough   [] Hemoptysis   [] Wheeze  [] COPD   [] Asthma Neurologic:  [] Dizziness  [] Blackouts   [] Seizures   [] History of stroke   [] History of TIA  [] Aphasia   [] Temporary blindness   [] Dysphagia   [] Weakness or numbness in arms   [] Weakness or numbness in legs Musculoskeletal:  [x] Arthritis   [] Joint swelling   [x] Joint pain   [] Low back pain Hematologic:  [] Easy  bruising  [] Easy bleeding   [] Hypercoagulable state   [x] Anemic  [] Hepatitis Gastrointestinal:  [] Blood in stool   [] Vomiting blood  [x] Gastroesophageal reflux/heartburn   [x] Abdominal pain Genitourinary:  [] Chronic kidney disease   [] Difficult urination  [] Frequent urination  [] Burning with urination   [] Hematuria Skin:  [] Rashes   [] Ulcers   [] Wounds Psychological:  [] History of anxiety   []  History of major depression.    Physical Exam BP 110/76   Pulse (!) 101   Resp 18   Ht 5\' 2"  (1.575 m)   Wt 101 lb (45.8 kg)   BMI 18.47 kg/m  Gen:   NAD, cachectic Head: [place]/AT, + temporalis wasting.  Ear/Nose/Throat: Hearing grossly intact, nares w/o erythema or drainage, oropharynx w/o Erythema/Exudate Eyes: Conjunctiva clear, sclera non-icteric  Neck: trachea midline.  No JVD.  Pulmonary:  Good air movement, respirations not labored, no use of accessory muscles  Cardiac: tachycardic and irregular Vascular:  Vessel Right Left  Radial Palpable Palpable                                   Musculoskeletal: M/S 5/5 throughout.  Extremities without ischemic changes.  No deformity or atrophy.  The left upper extremity has very mild edema mostly in the dependent part of the upper arm and the soft tissue.  No appreciable lower extremity edema is present. Neurologic: Sensation grossly intact in extremities.  Symmetrical.  Speech is fluent. Motor exam as listed above. Psychiatric: Judgment and insight are poor.  Poor historian Dermatologic: No rashes or ulcers noted.  No cellulitis or open wounds.    Radiology No results found.  Labs No results found for this or any previous visit (from the past 2160 hours).  Assessment/Plan:  Acute deep vein thrombosis (DVT) of axillary vein of left upper extremity (HCC) Although I do not have the ultrasound, but reports she has a left upper extremity DVT that is being treated with Eliquis.  This has resulted in  a good clinical response with marked  improvement in the swelling in her arm.  I would generally treat her at least 3 months and then we can repeat a duplex study to assess to see if there has been resolution of the DVT or if there is still DVT present.  She has not currently had any issues with anticoagulation.  She has atrial fibrillation as well.  Essential hypertension blood pressure control important in reducing the progression of atherosclerotic disease. On appropriate oral medications.   SBO (small bowel obstruction) (HCC) Apparently with recent hospitalization.  This could have been an underlying source of why she got a new DVT.  Type 2 diabetes mellitus without complications (HCC) blood glucose control important in reducing the progression of atherosclerotic disease. Also, involved in wound healing. On appropriate medications.      Festus Barren 12/14/2023, 3:41 PM   This note was created with Dragon medical transcription system.  Any errors from dictation are unintentional.

## 2023-12-14 NOTE — Assessment & Plan Note (Signed)
 blood glucose control important in reducing the progression of atherosclerotic disease. Also, involved in wound healing. On appropriate medications.

## 2024-02-09 ENCOUNTER — Other Ambulatory Visit (INDEPENDENT_AMBULATORY_CARE_PROVIDER_SITE_OTHER): Payer: Self-pay | Admitting: Vascular Surgery

## 2024-02-09 DIAGNOSIS — I82A12 Acute embolism and thrombosis of left axillary vein: Secondary | ICD-10-CM

## 2024-02-15 ENCOUNTER — Encounter (INDEPENDENT_AMBULATORY_CARE_PROVIDER_SITE_OTHER)

## 2024-02-15 ENCOUNTER — Ambulatory Visit (INDEPENDENT_AMBULATORY_CARE_PROVIDER_SITE_OTHER): Admitting: Vascular Surgery

## 2024-02-16 ENCOUNTER — Other Ambulatory Visit (INDEPENDENT_AMBULATORY_CARE_PROVIDER_SITE_OTHER): Payer: Self-pay

## 2024-02-16 NOTE — Telephone Encounter (Addendum)
 Patients daughter called stating that her mother was placed on Eliquis at [facility] at the ED, but they didn't schedule any refills for her. Patient seen Dr. Vonna Guardian on 12/14/23. He acknowledged the Eliquis and indicated that she was on the right medication. She has a follow up on 03/27/24.   I sent 60 qty 5 mg bid with 2 refills to [facility] in [place].

## 2024-02-24 MED ORDER — ELIQUIS 5 MG PO TABS: 5.0000 mg | ORAL_TABLET | Freq: Two times a day (BID) | ORAL | 2 refills | Status: DC

## 2024-03-27 ENCOUNTER — Ambulatory Visit (INDEPENDENT_AMBULATORY_CARE_PROVIDER_SITE_OTHER): Admitting: Nurse Practitioner

## 2024-03-27 ENCOUNTER — Encounter (INDEPENDENT_AMBULATORY_CARE_PROVIDER_SITE_OTHER)

## 2024-05-15 ENCOUNTER — Encounter (INDEPENDENT_AMBULATORY_CARE_PROVIDER_SITE_OTHER): Payer: Self-pay | Admitting: Nurse Practitioner

## 2024-05-15 ENCOUNTER — Ambulatory Visit (INDEPENDENT_AMBULATORY_CARE_PROVIDER_SITE_OTHER): Admitting: Nurse Practitioner

## 2024-05-15 ENCOUNTER — Ambulatory Visit (INDEPENDENT_AMBULATORY_CARE_PROVIDER_SITE_OTHER)

## 2024-05-15 VITALS — BP 112/73 | HR 86 | Ht 62.0 in | Wt 101.0 lb

## 2024-05-15 DIAGNOSIS — I10 Essential (primary) hypertension: Secondary | ICD-10-CM

## 2024-05-15 DIAGNOSIS — I82A12 Acute embolism and thrombosis of left axillary vein: Secondary | ICD-10-CM

## 2024-05-15 DIAGNOSIS — M25579 Pain in unspecified ankle and joints of unspecified foot: Secondary | ICD-10-CM | POA: Diagnosis not present

## 2024-05-15 MED ORDER — APIXABAN 2.5 MG PO TABS: 2.5000 mg | ORAL_TABLET | Freq: Two times a day (BID) | ORAL | 11 refills | Status: AC

## 2024-05-15 NOTE — Progress Notes (Signed)
 Subjective:    Patient ID: Kristy Archer, female    DOB: 03-30-1941, 83 y.o.   MRN: [id number] Chief Complaint  Patient presents with   Follow-up     fu 2-3 months + L UE DVT see JD     Kristy Archer is a 83 y.o. female that returns today for evaluation of a left arm DVT.  She was in the hospital with a bowel blockage and she had extensive left upper arm swelling.  It was noted that she had a DVT in the left upper extremity.  Since that time the patient has been doing well with Eliquis  5 mg twice daily.  She does have a history of atrial fibrillation and was not anticoagulated.  She has been on 5 mg twice daily and tolerated this well.  She denies any issues of bleeding.  Last issue is for some stability.  She notes that she tried stents more than felt a popping sensation in her ankle that did not have any swelling or issues following the incident.  Today noninvasive studies show no evidence of acute thrombus in the left upper extremity.  There is no evidence of prior chronic DVT.      Review of Systems  Cardiovascular:  Positive for leg swelling.  Neurological:  Positive for weakness.  All other systems reviewed and are negative.      Objective:   Physical Exam Vitals reviewed.  HENT:     Head: Normocephalic.  Cardiovascular:     Rate and Rhythm: Normal rate.     Pulses: Normal pulses.  Pulmonary:     Effort: Pulmonary effort is normal.  Skin:    General: Skin is warm and dry.  Neurological:     Mental Status: She is alert and oriented to person, place, and time.     Motor: Weakness present.     Gait: Gait abnormal.  Psychiatric:        Mood and Affect: Mood normal.        Behavior: Behavior normal.        Thought Content: Thought content normal.        Judgment: Judgment normal.     BP 112/73   Pulse 86   Ht 5' 2 (1.575 m)   Wt 101 lb (45.8 kg)   BMI 18.47 kg/m   Past Medical History:  Diagnosis Date   Age-related osteoporosis without current  pathological fracture 12/03/2021   Chest pain    Chest pain of uncertain etiology 12/05/2021   Chronic atrial fibrillation, unspecified (HCC) 01/16/2014   Last Assessment & Plan:  Formatting of this note might be different from the original. -Home meds: Sotalol  80 mg twice daily, Xarelto 20 mg daily -Sotalol  dose had to be temporarily reduced due to slight decrease in renal functions however it was returned to her home dose.  -Heart rate mostly in the 50s, QTC on EKG 11/30/18  478 msec -Xarelto had to be held on admission for insertion of chest tube    Chronic back pain 11/25/2018   Last Assessment & Plan:  Formatting of this note might be different from the original. - Continue home pain medication  - Added miralax daily prn   Chronic pain syndrome 12/03/2021   Constipation 12/03/2018   Last Assessment & Plan:  Formatting of this note might be different from the original. 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 - History of hiatal hernia status post fundoplication in 2005.  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  History of multiple EGDs for dilatation. - S/p thoracotomy with partial gastrectomy/esophagectomy in 2008 with thoracic esophagogastric    Hypomagnesemia 12/01/2018   Last Assessment & Plan:  Formatting of this note might be different from the original. Replaced during hospitalization.   Hypothyroidism  01/16/2014   Last Assessment & Plan:  Formatting of this note might be different from the original. 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   Mixed hyperlipidemia 12/03/2021   OSA (obstructive sleep apnea) 04/16/2021   Other long term (current) drug therapy 12/03/2021   Other vitamin B12 deficiency anemias 12/03/2021   Pain in left hip 12/03/2021   Pain in right foot 12/03/2021   Pain in right knee 12/03/2021   Primary generalized (osteo)arthritis 12/03/2021   Primary osteoarthritis, right ankle and foot 12/03/2021   Recurrent major depression in full remission (HCC) 12/03/2021   Tinea unguium 12/03/2021   Type 2 diabetes mellitus without complications (HCC) 12/03/2021   Unspecified severe protein-calorie malnutrition (HCC) 12/03/2021   Urinary tract infectious disease 12/03/2021   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   Vitamin D deficiency 12/03/2021    Social History   Socioeconomic History   Marital status: Divorced    Spouse name: Not on file   Number of children: Not on file   Years of education: Not on file   Highest education level: Not on file  Occupational History   Not on file  Tobacco Use   Smoking status: Never    Passive exposure: Never   Smokeless tobacco: Never  Substance and Sexual Activity   Alcohol use: Not Currently   Drug use: Not on file   Sexual activity: Not on file  Other Topics Concern   Not on file  Social History Narrative   Not on file   Social Drivers of Health   Financial Resource Strain: Not on file  Food Insecurity: Low Risk  (08/24/2023)   Received from [facility]   Hunger Vital Sign    Within the past 12 months, you worried that your food would run out before you got money to buy more: Never true    Within the past 12 months, the food you bought just didn't last and you didn't have money to get more. : Never true  Transportation Needs: No Transportation Needs  (08/24/2023)   Received from [facility]    In the past 12 months, has lack of reliable transportation kept you from medical appointments, meetings, work or from getting things needed for daily living? : No  Physical Activity: Not on file  Stress: Not on file  Social Connections: Not on file  Intimate Partner Violence: Not At Risk (07/14/2023)   Humiliation, Afraid, Rape, and Kick questionnaire    Fear of Current or Ex-Partner: No    Emotionally Abused: No    Physically Abused: No    Sexually  Abused: No    Past Surgical History:  Procedure Laterality Date   APPENDECTOMY     CHOLECYSTECTOMY      Family History  Problem Relation Age of Onset   Diabetes Mother    Cancer Mother    Heart disease Father    Diabetes Father    Hypertension Father     Allergies  Allergen Reactions   Codeine Anaphylaxis    Other reaction(s): Unknown Can take morphine  per patient; also tolerates hydromorphone     Linezolid     Other reaction(s): Angioedema (ALLERGY/intolerance)   Sulfa Antibiotics Itching       Latest Ref Rng & Units 07/20/2023    4:26 AM 07/18/2023    3:43 AM 07/17/2023    4:53 AM  CBC  WBC 4.0 - 10.5 K/uL 8.6  8.8  11.1   Hemoglobin 12.0 - 15.0 g/dL 88.9  89.8  88.7   Hematocrit 36.0 - 46.0 % 34.9  32.1  34.9   Platelets 150 - 400 K/uL 247  237  255       CMP     Component Value Date/Time   NA 135 07/20/2023 0426   NA 143 12/05/2021 1110   K 4.0 07/20/2023 0426   CL 98 07/20/2023 0426   CO2 29 07/20/2023 0426   GLUCOSE 76 07/20/2023 0426   BUN 9 07/20/2023 0426   BUN 13 12/05/2021 1110   CREATININE 0.47 07/20/2023 0426   CALCIUM 8.4 (L) 07/20/2023 0426   PROT 5.8 (L) 07/15/2023 0246   ALBUMIN 2.9 (L) 07/15/2023 0246   AST 20 07/15/2023 0246   ALT 10 07/15/2023 0246   ALKPHOS 61 07/15/2023 0246   BILITOT 0.6 07/15/2023 0246   EGFR 89 12/05/2021 1110   GFRNONAA >60 07/20/2023 0426     No results found.     Assessment & Plan:    1. Acute deep vein thrombosis (DVT) of axillary vein of left upper extremity (HCC) (Primary) The patient has done very well with anticoagulation.  She has concern regarding the DVT and since she has not had any adverse effects from the Eliquis  it is certainly reasonable to transition her to a maintenance dose of 2.5 mg twice daily. Return in 1 year to evaluate in 1 year. - apixaban  (ELIQUIS ) 2.5 MG TABS tablet; Take 1 tablet (2.5 mg total) by mouth 2 (two) times daily.  Dispense: 60 tablet; Refill: 11  2. Essential hypertension Continue antihypertensive medications as already ordered, these medications have been reviewed and there are no changes at this time.  3. Pain in joint involving ankle and foot, unspecified laterality Patient notes that she has some pain in her ankle area when trying to stand.  We will refer patient podiatry for further workup and evaluation   Current Outpatient Medications on File Prior to Visit  Medication Sig Dispense Refill   aminophylline  injection Inject 3 mLs (75 mg total) into the vein once for 1 dose. 3 mL 0   amLODipine  (NORVASC ) 5 MG tablet Take 1 tablet (5 mg total) by mouth daily. 90 tablet 1   cyanocobalamin  1000 MCG tablet Take 1,000 mcg by mouth daily.     estradiol  (ESTRACE ) 0.1 MG/GM vaginal cream Estrogen Cream Instruction Discard applicator Apply pea sized amount to tip of finger to urethra before bed. Wash hands well after application. Use Monday, Wednesday and Friday 42.5 g 11   famotidine  (PEPCID ) 40 MG tablet Take 40 mg by mouth at bedtime.     levothyroxine  (SYNTHROID ) 88  MCG tablet Take 88 mcg by mouth every morning.     meclizine (ANTIVERT) 25 MG tablet Take 25 mg by mouth 3 (three) times daily as needed for dizziness.     metoCLOPramide (REGLAN) 10 MG tablet Take 10 mg by mouth 2 (two) times daily before a meal.     nitrofurantoin , macrocrystal-monohydrate, (MACROBID ) 100 MG capsule Take 1 capsule (100 mg total) by mouth daily. 90 capsule  0   omeprazole (PRILOSEC) 40 MG capsule Take 40 mg by mouth 2 (two) times daily.     oxyCODONE -acetaminophen  (PERCOCET) 10-325 MG tablet Take 1 tablet by mouth 4 (four) times daily as needed for pain.     sertraline  (ZOLOFT ) 100 MG tablet Take 100 mg by mouth daily.     simvastatin  (ZOCOR ) 20 MG tablet Take 20 mg by mouth every evening.     sodium chloride  1 g tablet Take 1 g by mouth 2 (two) times daily.     sucralfate  (CARAFATE ) 1 g tablet Take 1 g by mouth 4 (four) times daily.     VOQUEZNA 20 MG TABS Take 1 tablet by mouth daily.     aspirin  EC 81 MG tablet Take 81 mg by mouth daily. (Patient not taking: Reported on 05/15/2024)     sotalol  (BETAPACE ) 80 MG tablet Take 80 mg by mouth every 12 (twelve) hours. (Patient not taking: Reported on 05/15/2024)     No current facility-administered medications on file prior to visit.    There are no Patient Instructions on file for this visit. No follow-ups on file.   Ravenna Legore E Zaevion Parke, NP

## 2024-05-19 ENCOUNTER — Other Ambulatory Visit (INDEPENDENT_AMBULATORY_CARE_PROVIDER_SITE_OTHER): Payer: Self-pay | Admitting: Nurse Practitioner

## 2024-07-18 DIAGNOSIS — R110 Nausea: Secondary | ICD-10-CM | POA: Insufficient documentation

## 2024-07-18 DIAGNOSIS — R21 Rash and other nonspecific skin eruption: Secondary | ICD-10-CM | POA: Insufficient documentation

## 2024-07-18 DIAGNOSIS — M19079 Primary osteoarthritis, unspecified ankle and foot: Secondary | ICD-10-CM | POA: Insufficient documentation

## 2024-07-19 NOTE — Progress Notes (Deleted)
 Cardiology Office Note:    Date:  07/19/2024   ID:  Kristy Archer, DOB 10/23/1940, MRN [id number]  PCP:  Zachary Lamar BRAVO, NP  Cardiologist:  Redell Leiter, MD    Referring MD: Zachary Lamar BRAVO, NP    ASSESSMENT:    1. Paroxysmal atrial fibrillation (HCC)   2. High risk medication use   3. Essential hypertension   4. Anemia, unspecified type    PLAN:    In order of problems listed above:  ***   Next appointment: ***   Medication Adjustments/Labs and Tests Ordered: Current medicines are reviewed at length with the patient today.  Concerns regarding medicines are outlined above.  No orders of the defined types were placed in this encounter.  No orders of the defined types were placed in this encounter.    History of Present Illness:    Kristy Archer is a 83 y.o. female with a hx of paroxysmal atrial fibrillation maintaining sinus rhythm on sotalol  she is not anticoagulated because of frailty and bleeding risk hypertension last seen 10/30/2023 Compliance with diet, lifestyle and medications: *** Past Medical History:  Diagnosis Date   Acute deep vein thrombosis (DVT) of axillary vein of left upper extremity (HCC) 12/14/2023   Age-related osteoporosis without current pathological fracture 12/03/2021   Breast swelling 08/26/2023   Chest pain    Chest pain of uncertain etiology 12/05/2021   Chronic atrial fibrillation, unspecified (HCC) 01/16/2014   Last Assessment & Plan:  Formatting of this note might be different from the original. -Home meds: Sotalol  80 mg twice daily, Xarelto 20 mg daily -Sotalol  dose had to be temporarily reduced due to slight decrease in renal functions however it was returned to her home dose.  -Heart rate mostly in the 50s, QTC on EKG 11/30/18  478 msec -Xarelto had to be held on admission for insertion of chest tube    Chronic back pain 11/25/2018   Last Assessment & Plan:  Formatting of this note might be different from the original. - Continue  home pain medication  - Added miralax daily prn   Chronic pain syndrome 12/03/2021   Constipation 12/03/2018   Last Assessment & Plan:  Formatting of this note might be different from the original. 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 - History of hiatal hernia status post fundoplication in 2005.  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  History of multiple EGDs for dilatation. - S/p thoracotomy with partial gastrectomy/esophagectomy in 2008 with thoracic esophagogastric    Hypomagnesemia 12/01/2018   Last Assessment & Plan:  Formatting of this note might be different from the original. Replaced during hospitalization.   Hypothyroidism 01/16/2014   Last Assessment & Plan:  Formatting of this note might be different from the original. 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   Mixed hyperlipidemia 12/03/2021   Nausea 07/18/2024  OSA (obstructive sleep apnea) 04/16/2021   Other long term (current) drug therapy 12/03/2021   Other vitamin B12 deficiency anemias 12/03/2021   Pain in left hip 12/03/2021   Pain in right foot 12/03/2021   Pain in right knee 12/03/2021   Primary generalized (osteo)arthritis 12/03/2021   Primary osteoarthritis, right ankle and foot 12/03/2021    Protein-calorie malnutrition, severe 07/15/2023   Recurrent major depression in full remission 12/03/2021   Tinea unguium 12/03/2021   Type 2 diabetes mellitus without complications (HCC) 12/03/2021   Unspecified severe protein-calorie malnutrition 12/03/2021   Urinary tract infectious disease 12/03/2021   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      EKGs/Labs/Other Studies Reviewed:    The following studies were reviewed today:  Cardiac Studies & Procedures   ______________________________________________________________________________________________   STRESS TESTS  NM MYOCAR MULTI W/SPECT W 06/07/2023  Narrative   The study is normal. The study is low risk.   0.5 mm of horizontal ST depression in the inferior and inferolateral leads (II, III, aVF, V5 and V6) was noted.   LV perfusion is normal. There is no evidence of ischemia. There is no evidence of infarction.   Left ventricular function is normal. Nuclear stress EF: 73%. The left ventricular ejection fraction is hyperdynamic (>65%). End diastolic cavity size is normal. End systolic cavity size is normal.   CT attenuation images showed mild aortic and coronary calcification   ECHOCARDIOGRAM  ECHOCARDIOGRAM COMPLETE 06/07/2023  Narrative ECHOCARDIOGRAM REPORT    Patient Name:   Kristy Archer Dezeeuw Date of Exam: 06/07/2023 Medical Rec #:  [id number]      Height:       62.0 in Accession #:    [id number]     Weight:       101.9 lb Date of Birth:  1941-07-05      BSA:          1.435 m Patient Age:    82 years       BP:           151/62 mmHg Patient Gender: F              HR:           60 bpm. Exam Location:  [facility]  Procedure: 2D Echo, Cardiac Doppler and Color Doppler  Indications:     Chest pain R07.9  History:         Patient has prior history of  Echocardiogram examinations, most recent 12/16/2021. Arrythmias:Atrial Fibrillation, Signs/Symptoms:Chest Pain; Risk Factors:Sleep Apnea.  Sonographer:     Christopher Furnace Referring Phys:  [id number] NOAH BEDFORD WOUK Diagnosing Phys: Deatrice Cage MD  IMPRESSIONS   1. Left ventricular ejection fraction, by estimation, is 70 to 75%. The left ventricle has hyperdynamic function. The left ventricle has no regional wall motion abnormalities. There is moderate asymmetric left ventricular hypertrophy of the basal-septal segment. Left ventricular diastolic parameters are consistent with Grade I diastolic dysfunction (impaired relaxation). No significant LVOT gradient at rest or with Valsalva. 2. Right ventricular systolic function is normal. The right ventricular size is normal. Tricuspid regurgitation signal is inadequate for assessing PA pressure. 3. The mitral valve is normal in structure. No evidence of mitral valve regurgitation. No evidence of mitral stenosis. Moderate mitral annular calcification. 4. The aortic valve is normal in structure. Aortic valve regurgitation is  mild to moderate. Aortic valve sclerosis/calcification is present, without any evidence of aortic stenosis.  FINDINGS Left Ventricle: Left ventricular ejection fraction, by estimation, is 70 to 75%. The left ventricle has hyperdynamic function. The left ventricle has no regional wall motion abnormalities. The left ventricular internal cavity size was normal in size. There is moderate asymmetric left ventricular hypertrophy of the basal-septal segment. Left ventricular diastolic parameters are consistent with Grade I diastolic dysfunction (impaired relaxation).  Right Ventricle: The right ventricular size is normal. No increase in right ventricular wall thickness. Right ventricular systolic function is normal. Tricuspid regurgitation signal is inadequate for assessing PA pressure.  Left [facility]: Left atrial size was normal in  size.  Right [facility]: Right atrial size was normal in size.  Pericardium: There is no evidence of pericardial effusion.  Mitral Valve: The mitral valve is normal in structure. There is mild thickening of the mitral valve leaflet(s). Moderate mitral annular calcification. No evidence of mitral valve regurgitation. No evidence of mitral valve stenosis.  Tricuspid Valve: The tricuspid valve is normal in structure. Tricuspid valve regurgitation is not demonstrated. No evidence of tricuspid stenosis.  Aortic Valve: The aortic valve is normal in structure. Aortic valve regurgitation is mild to moderate. Aortic regurgitation PHT measures 585 msec. Aortic valve sclerosis/calcification is present, without any evidence of aortic stenosis. Aortic valve mean gradient measures 6.5 mmHg. Aortic valve peak gradient measures 11.0 mmHg. Aortic valve area, by VTI measures 3.25 cm.  Pulmonic Valve: The pulmonic valve was normal in structure. Pulmonic valve regurgitation is mild. No evidence of pulmonic stenosis.  Aorta: The aortic root is normal in size and structure.  Venous: The inferior vena cava was not well visualized.  IAS/Shunts: No atrial level shunt detected by color flow Doppler.   LEFT VENTRICLE PLAX 2D LVIDd:         4.10 cm   Diastology LVIDs:         2.80 cm   LV e' medial:    4.35 cm/s LV PW:         1.20 cm   LV E/e' medial:  14.3 LV IVS:        1.30 cm   LV e' lateral:   9.36 cm/s LVOT diam:     2.00 cm   LV E/e' lateral: 6.7 LV SV:         122 LV SV Index:   85 LVOT Area:     3.14 cm   RIGHT VENTRICLE RV Basal diam:  2.40 cm RV Mid diam:    2.20 cm RV S prime:     15.80 cm/s TAPSE (M-mode): 1.7 cm  LEFT ATRIUM           Index        RIGHT ATRIUM          Index LA diam:      3.90 cm 2.72 cm/m   RA Area:     8.67 cm LA Vol (A2C): 24.4 ml 17.00 ml/m  RA Volume:   14.60 ml 10.17 ml/m LA Vol (A4C): 23.5 ml 16.38 ml/m AORTIC VALVE AV Area (Vmax):    3.58 cm AV Area  (Vmean):   3.31 cm AV Area (VTI):     3.25 cm AV Vmax:           166.00 cm/s AV Vmean:          114.000 cm/s AV VTI:            0.376 m AV Peak  Grad:      11.0 mmHg AV Mean Grad:      6.5 mmHg LVOT Vmax:         189.00 cm/s LVOT Vmean:        120.000 cm/s LVOT VTI:          0.389 m LVOT/AV VTI ratio: 1.04 AI PHT:            585 msec  AORTA Ao Root diam: 3.55 cm  MITRAL VALVE               TRICUSPID VALVE MV Area (PHT): 2.58 cm    TR Peak grad:   28.1 mmHg MV Decel Time: 294 msec    TR Vmax:        265.00 cm/s MV E velocity: 62.40 cm/s MV A velocity: 99.00 cm/s  SHUNTS MV E/A ratio:  0.63        Systemic VTI:  0.39 m Systemic Diam: 2.00 cm  Deatrice Cage MD Electronically signed by Deatrice Cage MD Signature Date/Time: 06/07/2023/1:51:12 PM    Final          ______________________________________________________________________________________________          Recent Labs: No results found for requested labs within last 365 days.  Recent Lipid Panel    Component Value Date/Time   CHOL 156 06/07/2023 0629   TRIG 55 06/07/2023 0629   HDL 59 06/07/2023 0629   CHOLHDL 2.6 06/07/2023 0629   VLDL 11 06/07/2023 0629   LDLCALC 86 06/07/2023 0629    Physical Exam:    VS:  There were no vitals taken for this visit.    Wt Readings from Last 3 Encounters:  05/15/24 101 lb (45.8 kg)  12/14/23 101 lb (45.8 kg)  07/13/23 101 lb 6.6 oz (46 kg)     GEN: *** Well nourished, well developed in no acute distress HEENT: Normal NECK: No JVD; No carotid bruits LYMPHATICS: No lymphadenopathy CARDIAC: ***RRR, no murmurs, rubs, gallops RESPIRATORY:  Clear to auscultation without rales, wheezing or rhonchi  ABDOMEN: Soft, non-tender, non-distended MUSCULOSKELETAL:  No edema; No deformity  SKIN: Warm and dry NEUROLOGIC:  Alert and oriented x 3 PSYCHIATRIC:  Normal affect    Signed, Redell Leiter, MD  07/19/2024 6:16 PM    [facility]

## 2024-07-20 ENCOUNTER — Ambulatory Visit: Admitting: Cardiology

## 2024-07-20 DIAGNOSIS — D649 Anemia, unspecified: Secondary | ICD-10-CM

## 2024-07-20 DIAGNOSIS — I480 Paroxysmal atrial fibrillation: Secondary | ICD-10-CM

## 2024-07-20 DIAGNOSIS — I10 Essential (primary) hypertension: Secondary | ICD-10-CM

## 2024-07-20 DIAGNOSIS — Z79899 Other long term (current) drug therapy: Secondary | ICD-10-CM

## 2024-07-27 NOTE — Progress Notes (Unsigned)
 Cardiology Office Note:    Date:  07/28/2024   ID:  Kristy Archer, DOB 12/15/1940, MRN [id number]  PCP:  Zachary Lamar BRAVO, NP  Cardiologist:  Redell Leiter, MD    Referring MD: Zachary Lamar BRAVO, NP    ASSESSMENT:    1. Paroxysmal atrial fibrillation (HCC)   2. High risk medication use   3. Essential hypertension     PLAN:    In order of problems listed above:  Fortunately maintaining nonatrial fibrillation rhythm I described today as multifocal atrial tachycardia rate is 100 bpm she is asymptomatic anticoagulated at this time I would not add a rate slowing medication or an antiarrhythmic drug. Fortunately no long run antiarrhythmic drug Continue reduced dose anticoagulant Stable the son thinks he no longer takes amlodipine .   Next appointment: 1 year   Medication Adjustments/Labs and Tests Ordered: Current medicines are reviewed at length with the patient today.  Concerns regarding medicines are outlined above.  Orders Placed This Encounter  Procedures   EKG 12-Lead   No orders of the defined types were placed in this encounter.    History of Present Illness:    Kristy Archer is a 82 y.o. female with a hx of paroxysmal atrial fibrillation previously maintaining sinus rhythm on sotalol  she is anticoagulated and hypertension last seen 10/30/2023 her son thinks that amlodipine  was discontinued  Compliance with diet, lifestyle and medications: Yes Past Medical History:  Diagnosis Date   Acute deep vein thrombosis (DVT) of axillary vein of left upper extremity (HCC) 12/14/2023   Age-related osteoporosis without current pathological fracture 12/03/2021   Breast swelling 08/26/2023   Chest pain    Chest pain of uncertain etiology 12/05/2021   Chronic atrial fibrillation, unspecified (HCC) 01/16/2014   Last Assessment & Plan:  Formatting of this note might be different from the original. -Home meds: Sotalol  80 mg twice daily, Xarelto 20 mg daily -Sotalol  dose had to be  temporarily reduced due to slight decrease in renal functions however it was returned to her home dose.  -Heart rate mostly in the 50s, QTC on EKG 11/30/18  478 msec -Xarelto had to be held on admission for insertion of chest tube    Chronic back pain 11/25/2018   Last Assessment & Plan:  Formatting of this note might be different from the original. - Continue home pain medication  - Added miralax daily prn   Chronic pain syndrome 12/03/2021   Constipation 12/03/2018   Last Assessment & Plan:  Formatting of this note might be different from the original. 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 - History of hiatal hernia status post fundoplication in 2005.  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  History of multiple EGDs for dilatation. - S/p thoracotomy with partial gastrectomy/esophagectomy in 2008 with thoracic esophagogastric    Hypomagnesemia 12/01/2018   Last Assessment & Plan:  Formatting of this note might be different from the original. Replaced during hospitalization.  Hypothyroidism 01/16/2014   Last Assessment & Plan:  Formatting of this note might be different from the original. 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   Mixed hyperlipidemia 12/03/2021   Nausea 07/18/2024   OSA  (obstructive sleep apnea) 04/16/2021   Other long term (current) drug therapy 12/03/2021   Other vitamin B12 deficiency anemias 12/03/2021   Pain in left hip 12/03/2021   Pain in right foot 12/03/2021   Pain in right knee 12/03/2021   Primary generalized (osteo)arthritis 12/03/2021   Primary osteoarthritis, right ankle and foot 12/03/2021   Protein-calorie malnutrition, severe 07/15/2023   Recurrent major depression in full remission 12/03/2021   Tinea unguium 12/03/2021   Type 2 diabetes mellitus without complications (HCC) 12/03/2021   Unspecified severe protein-calorie malnutrition 12/03/2021   Urinary tract infectious disease 12/03/2021   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   amLODipine  (NORVASC ) 2.5 MG tablet Take 2.5 mg by mouth daily.   apixaban  (ELIQUIS ) 2.5 MG TABS tablet Take 1 tablet (2.5 mg total) by mouth 2 (two) times daily.   cyanocobalamin  1000 MCG tablet Take 1,000 mcg by mouth daily.   estradiol  (ESTRACE ) 0.1 MG/GM vaginal cream Estrogen Cream Instruction Discard applicator Apply pea sized amount to tip of finger to urethra before bed. Wash hands well after application. Use Monday, Wednesday and Friday   famotidine  (PEPCID ) 40 MG tablet Take 40 mg by mouth at bedtime.   levothyroxine  (SYNTHROID ) 100 MCG tablet Take 100 mcg by mouth every morning.   meclizine (ANTIVERT) 25 MG tablet Take 25 mg by mouth 3 (three) times daily as needed for dizziness.   metoCLOPramide (REGLAN) 10 MG tablet Take 10 mg by mouth 2 (two) times daily before a meal.   nitrofurantoin , macrocrystal-monohydrate, (MACROBID ) 100 MG capsule Take 1 capsule (100 mg total) by mouth daily.   omeprazole (PRILOSEC) 40 MG capsule Take 40 mg by mouth 2 (two) times daily.   ondansetron  (ZOFRAN ) 8 MG tablet Take 8 mg by mouth every 8  (eight) hours as needed.   oxyCODONE -acetaminophen  (PERCOCET) 10-325 MG tablet Take 1 tablet by mouth 4 (four) times daily as needed for pain.   sertraline  (ZOLOFT ) 100 MG tablet Take 100 mg by mouth daily.   simvastatin  (ZOCOR ) 20 MG tablet Take 20 mg by mouth every evening.   sodium chloride  1 g tablet Take 1 g by mouth 2 (two) times daily.   sucralfate  (CARAFATE ) 1 g tablet Take 1 g by mouth 4 (four) times daily.   VOQUEZNA 20 MG TABS Take 1 tablet by mouth daily.      EKGs/Labs/Other Studies Reviewed:    The following studies were reviewed today: HerEKG today is different is not atrial fibrillation described as multifocal atrial tachycardia.  Physical Exam:    VS:  BP 110/70   Pulse 98   Ht 5' 2 (1.575 m)   Wt 101 lb (45.8 kg)   SpO2 93%   BMI 18.47 kg/m     Wt Readings from Last 3 Encounters:  07/28/24 101 lb (45.8 kg)  05/15/24 101 lb (45.8 kg)  12/14/23 101 lb (45.8 kg)     GEN: Very frail she was not weighed today but her son will essentially weigh was  in the range of 100 pounds well nourished, well developed in no acute distress HEENT: Normal NECK: No JVD; No carotid bruits LYMPHATICS: No lymphadenopathy CARDIAC: RRR, no murmurs, rubs, gallops RESPIRATORY:  Clear to auscultation without rales, wheezing or rhonchi  ABDOMEN: Soft, non-tender, non-distended MUSCULOSKELETAL:  No edema; No deformity  SKIN: Warm and dry NEUROLOGIC:  Alert and oriented x 3 PSYCHIATRIC:  Normal affect    Signed, Redell Leiter, MD  07/28/2024 4:06 PM    [facility]

## 2024-07-28 ENCOUNTER — Ambulatory Visit: Attending: Cardiology | Admitting: Cardiology

## 2024-07-28 ENCOUNTER — Encounter: Payer: Self-pay | Admitting: Cardiology

## 2024-07-28 VITALS — BP 110/70 | HR 98 | Ht 62.0 in | Wt 101.0 lb

## 2024-07-28 DIAGNOSIS — I480 Paroxysmal atrial fibrillation: Secondary | ICD-10-CM | POA: Diagnosis not present

## 2024-07-28 DIAGNOSIS — I10 Essential (primary) hypertension: Secondary | ICD-10-CM | POA: Diagnosis not present

## 2024-07-28 DIAGNOSIS — Z79899 Other long term (current) drug therapy: Secondary | ICD-10-CM

## 2024-07-28 NOTE — Patient Instructions (Signed)

## 2024-10-09 ENCOUNTER — Observation Stay
Admission: EM | Admit: 2024-10-09 | Discharge: 2024-10-10 | Disposition: A | Attending: Internal Medicine | Admitting: Internal Medicine

## 2024-10-09 ENCOUNTER — Emergency Department

## 2024-10-09 ENCOUNTER — Other Ambulatory Visit: Payer: Self-pay

## 2024-10-09 DIAGNOSIS — R2689 Other abnormalities of gait and mobility: Secondary | ICD-10-CM | POA: Diagnosis not present

## 2024-10-09 DIAGNOSIS — Z23 Encounter for immunization: Secondary | ICD-10-CM | POA: Insufficient documentation

## 2024-10-09 DIAGNOSIS — F329 Major depressive disorder, single episode, unspecified: Secondary | ICD-10-CM | POA: Diagnosis not present

## 2024-10-09 DIAGNOSIS — F411 Generalized anxiety disorder: Secondary | ICD-10-CM | POA: Diagnosis not present

## 2024-10-09 DIAGNOSIS — J189 Pneumonia, unspecified organism: Principal | ICD-10-CM

## 2024-10-09 DIAGNOSIS — Z79899 Other long term (current) drug therapy: Secondary | ICD-10-CM | POA: Insufficient documentation

## 2024-10-09 DIAGNOSIS — J9601 Acute respiratory failure with hypoxia: Principal | ICD-10-CM | POA: Diagnosis present

## 2024-10-09 DIAGNOSIS — R1310 Dysphagia, unspecified: Secondary | ICD-10-CM | POA: Diagnosis not present

## 2024-10-09 DIAGNOSIS — R0602 Shortness of breath: Secondary | ICD-10-CM | POA: Diagnosis present

## 2024-10-09 DIAGNOSIS — E119 Type 2 diabetes mellitus without complications: Secondary | ICD-10-CM | POA: Insufficient documentation

## 2024-10-09 DIAGNOSIS — G4733 Obstructive sleep apnea (adult) (pediatric): Secondary | ICD-10-CM | POA: Diagnosis present

## 2024-10-09 DIAGNOSIS — E782 Mixed hyperlipidemia: Secondary | ICD-10-CM | POA: Diagnosis present

## 2024-10-09 DIAGNOSIS — Z7901 Long term (current) use of anticoagulants: Secondary | ICD-10-CM | POA: Insufficient documentation

## 2024-10-09 DIAGNOSIS — E039 Hypothyroidism, unspecified: Secondary | ICD-10-CM | POA: Diagnosis not present

## 2024-10-09 DIAGNOSIS — I10 Essential (primary) hypertension: Secondary | ICD-10-CM | POA: Diagnosis present

## 2024-10-09 DIAGNOSIS — Z86718 Personal history of other venous thrombosis and embolism: Secondary | ICD-10-CM | POA: Insufficient documentation

## 2024-10-09 LAB — TROPONIN T, HIGH SENSITIVITY
Troponin T High Sensitivity: 16 ng/L (ref 0–19)
Troponin T High Sensitivity: <15 ng/L (ref 0–19)

## 2024-10-09 LAB — URINALYSIS, W/ REFLEX TO CULTURE (INFECTION SUSPECTED)
Bacteria, UA: NONE SEEN
Bilirubin Urine: NEGATIVE
Glucose, UA: NEGATIVE mg/dL
Hgb urine dipstick: NEGATIVE
Ketones, ur: NEGATIVE mg/dL
Leukocytes,Ua: NEGATIVE
Nitrite: NEGATIVE
Protein, ur: NEGATIVE mg/dL
Specific Gravity, Urine: 1.013 (ref 1.005–1.030)
pH: 7 (ref 5.0–8.0)

## 2024-10-09 LAB — RESP PANEL BY RT-PCR (RSV, FLU A&B, COVID)  RVPGX2
Influenza A by PCR: NEGATIVE
Influenza B by PCR: NEGATIVE
Resp Syncytial Virus by PCR: NEGATIVE
SARS Coronavirus 2 by RT PCR: NEGATIVE

## 2024-10-09 LAB — CBC WITH DIFFERENTIAL/PLATELET
Abs Immature Granulocytes: 0.02 K/uL (ref 0.00–0.07)
Basophils Absolute: 0 K/uL (ref 0.0–0.1)
Basophils Relative: 0 %
Eosinophils Absolute: 0.1 K/uL (ref 0.0–0.5)
Eosinophils Relative: 1 %
HCT: 34.8 % — ABNORMAL LOW (ref 36.0–46.0)
Hemoglobin: 10.9 g/dL — ABNORMAL LOW (ref 12.0–15.0)
Immature Granulocytes: 0 %
Lymphocytes Relative: 12 %
Lymphs Abs: 0.8 K/uL (ref 0.7–4.0)
MCH: 26.5 pg (ref 26.0–34.0)
MCHC: 31.3 g/dL (ref 30.0–36.0)
MCV: 84.7 fL (ref 80.0–100.0)
Monocytes Absolute: 0.2 K/uL (ref 0.1–1.0)
Monocytes Relative: 3 %
Neutro Abs: 6.1 K/uL (ref 1.7–7.7)
Neutrophils Relative %: 84 %
Platelets: 217 K/uL (ref 150–400)
RBC: 4.11 MIL/uL (ref 3.87–5.11)
RDW: 15.7 % — ABNORMAL HIGH (ref 11.5–15.5)
WBC: 7.2 K/uL (ref 4.0–10.5)
nRBC: 0 % (ref 0.0–0.2)

## 2024-10-09 LAB — COMPREHENSIVE METABOLIC PANEL WITH GFR
ALT: 8 U/L (ref 0–44)
AST: 17 U/L (ref 15–41)
Albumin: 3.6 g/dL (ref 3.5–5.0)
Alkaline Phosphatase: 88 U/L (ref 38–126)
Anion gap: 9 (ref 5–15)
BUN: 14 mg/dL (ref 8–23)
CO2: 31 mmol/L (ref 22–32)
Calcium: 8.6 mg/dL — ABNORMAL LOW (ref 8.9–10.3)
Chloride: 98 mmol/L (ref 98–111)
Creatinine, Ser: 0.58 mg/dL (ref 0.44–1.00)
GFR, Estimated: >60 mL/min
Glucose, Bld: 173 mg/dL — ABNORMAL HIGH (ref 70–99)
Potassium: 4.4 mmol/L (ref 3.5–5.1)
Sodium: 137 mmol/L (ref 135–145)
Total Bilirubin: 0.2 mg/dL (ref 0.0–1.2)
Total Protein: 6.7 g/dL (ref 6.5–8.1)

## 2024-10-09 LAB — LACTIC ACID, PLASMA: Lactic Acid, Venous: 1.9 mmol/L (ref 0.5–1.9)

## 2024-10-09 LAB — PRO BRAIN NATRIURETIC PEPTIDE: Pro Brain Natriuretic Peptide: 2715 pg/mL — ABNORMAL HIGH

## 2024-10-09 LAB — MAGNESIUM: Magnesium: 2 mg/dL (ref 1.7–2.4)

## 2024-10-09 LAB — GLUCOSE, CAPILLARY: Glucose-Capillary: 151 mg/dL — ABNORMAL HIGH (ref 70–99)

## 2024-10-09 MED ORDER — APIXABAN 2.5 MG PO TABS
2.5000 mg | ORAL_TABLET | Freq: Two times a day (BID) | ORAL | Status: DC
  Administered 2024-10-09 – 2024-10-10 (×2): 2.5 mg via ORAL
  Filled 2024-10-09 (×2): qty 1

## 2024-10-09 MED ORDER — SODIUM CHLORIDE 0.9 % IV SOLN
2.0000 g | Freq: Once | INTRAVENOUS | Status: AC
  Administered 2024-10-09: 2 g via INTRAVENOUS
  Filled 2024-10-09: qty 20

## 2024-10-09 MED ORDER — VONOPRAZAN FUMARATE 20 MG PO TABS: 1.0000 | ORAL_TABLET | Freq: Every day | ORAL | Status: DC

## 2024-10-09 MED ORDER — AZITHROMYCIN 500 MG PO TABS
500.0000 mg | ORAL_TABLET | Freq: Every day | ORAL | Status: DC
  Administered 2024-10-10: 500 mg via ORAL
  Filled 2024-10-09: qty 1

## 2024-10-09 MED ORDER — LACTATED RINGERS IV SOLN: INTRAVENOUS | Status: DC

## 2024-10-09 MED ORDER — OXYCODONE-ACETAMINOPHEN 10-325 MG PO TABS: 1.0000 | ORAL_TABLET | Freq: Four times a day (QID) | ORAL | Status: DC | PRN

## 2024-10-09 MED ORDER — OXYCODONE HCL 5 MG PO TABS
5.0000 mg | ORAL_TABLET | Freq: Four times a day (QID) | ORAL | Status: DC | PRN
  Administered 2024-10-10: 5 mg via ORAL
  Filled 2024-10-09 (×2): qty 1

## 2024-10-09 MED ORDER — ONDANSETRON HCL 4 MG/2ML IJ SOLN: 4.0000 mg | Freq: Four times a day (QID) | INTRAMUSCULAR | Status: DC | PRN

## 2024-10-09 MED ORDER — SUCRALFATE 1 G PO TABS
1.0000 g | ORAL_TABLET | Freq: Four times a day (QID) | ORAL | Status: DC
  Administered 2024-10-09 – 2024-10-10 (×3): 1 g via ORAL
  Filled 2024-10-09 (×3): qty 1

## 2024-10-09 MED ORDER — SERTRALINE HCL 50 MG PO TABS
100.0000 mg | ORAL_TABLET | Freq: Every day | ORAL | Status: DC
  Administered 2024-10-09 – 2024-10-10 (×2): 100 mg via ORAL
  Filled 2024-10-09 (×2): qty 2

## 2024-10-09 MED ORDER — PANTOPRAZOLE SODIUM 40 MG PO TBEC
40.0000 mg | DELAYED_RELEASE_TABLET | Freq: Every day | ORAL | Status: DC
  Administered 2024-10-09 – 2024-10-10 (×2): 40 mg via ORAL
  Filled 2024-10-09 (×2): qty 1

## 2024-10-09 MED ORDER — SENNOSIDES-DOCUSATE SODIUM 8.6-50 MG PO TABS
1.0000 | ORAL_TABLET | Freq: Every evening | ORAL | Status: DC | PRN
  Filled 2024-10-09: qty 1

## 2024-10-09 MED ORDER — AZITHROMYCIN 500 MG PO TABS
500.0000 mg | ORAL_TABLET | Freq: Once | ORAL | Status: AC
  Administered 2024-10-09: 500 mg via ORAL
  Filled 2024-10-09: qty 1

## 2024-10-09 MED ORDER — INFLUENZA VAC SPLIT HIGH-DOSE 0.5 ML IM SUSY
0.5000 mL | PREFILLED_SYRINGE | INTRAMUSCULAR | Status: AC
  Administered 2024-10-10: 0.5 mL via INTRAMUSCULAR
  Filled 2024-10-09: qty 0.5

## 2024-10-09 MED ORDER — SODIUM CHLORIDE 0.9 % IV SOLN: 500.0000 mg | Freq: Once | INTRAVENOUS | Status: DC

## 2024-10-09 MED ORDER — FAMOTIDINE 20 MG PO TABS
40.0000 mg | ORAL_TABLET | Freq: Every day | ORAL | Status: DC
  Administered 2024-10-09: 40 mg via ORAL
  Filled 2024-10-09: qty 2

## 2024-10-09 MED ORDER — LEVOTHYROXINE SODIUM 50 MCG PO TABS
100.0000 ug | ORAL_TABLET | Freq: Every morning | ORAL | Status: DC
  Administered 2024-10-10: 100 ug via ORAL
  Filled 2024-10-09: qty 2

## 2024-10-09 MED ORDER — ACETAMINOPHEN 650 MG RE SUPP: 650.0000 mg | Freq: Four times a day (QID) | RECTAL | Status: DC | PRN

## 2024-10-09 MED ORDER — SODIUM CHLORIDE 0.9 % IV SOLN
2.0000 g | INTRAVENOUS | Status: DC
  Administered 2024-10-10: 2 g via INTRAVENOUS
  Filled 2024-10-09: qty 20

## 2024-10-09 MED ORDER — PNEUMOCOCCAL 20-VAL CONJ VACC 0.5 ML IM SUSY
0.5000 mL | PREFILLED_SYRINGE | INTRAMUSCULAR | Status: AC
  Administered 2024-10-10: 0.5 mL via INTRAMUSCULAR
  Filled 2024-10-09: qty 0.5

## 2024-10-09 MED ORDER — OXYCODONE-ACETAMINOPHEN 5-325 MG PO TABS
1.0000 | ORAL_TABLET | Freq: Four times a day (QID) | ORAL | Status: DC | PRN
  Administered 2024-10-09 – 2024-10-10 (×4): 1 via ORAL
  Filled 2024-10-09 (×4): qty 1

## 2024-10-09 MED ORDER — ONDANSETRON HCL 4 MG PO TABS: 4.0000 mg | ORAL_TABLET | Freq: Four times a day (QID) | ORAL | Status: DC | PRN

## 2024-10-09 MED ORDER — SODIUM CHLORIDE 0.9% FLUSH
3.0000 mL | Freq: Two times a day (BID) | INTRAVENOUS | Status: DC
  Administered 2024-10-09 – 2024-10-10 (×2): 3 mL via INTRAVENOUS

## 2024-10-09 MED ORDER — ACETAMINOPHEN 325 MG PO TABS: 650.0000 mg | ORAL_TABLET | Freq: Four times a day (QID) | ORAL | Status: DC | PRN

## 2024-10-09 MED ORDER — INSULIN ASPART 100 UNIT/ML IJ SOLN: 0.0000 [IU] | Freq: Three times a day (TID) | INTRAMUSCULAR | Status: DC

## 2024-10-09 NOTE — ED Provider Notes (Signed)
 "  [facility] Provider Note    Event Date/Time   First MD Initiated Contact with Patient 10/09/24 1738     (approximate)   History   Shortness of Breath   HPI  Kristy Archer is a 84 y.o. female with a history of paroxysmal A-fib and hypertension who presents with shortness of breath since yesterday.  The patient states he believes that she aspirated some food and  it went into her lungs.  Since that time she has had a productive cough and shortness of breath.  She denies any chest pain she has no fever.  She denies any vomiting or diarrhea.  Per EMS, the patient's O2 saturation was 89% on room air when they arrived.  She is now on 4 L.  I reviewed the past medical records.  The patient's most recent outpatient counter was with cardiology on 11/7 for follow-up of her atrial fibrillation.   Physical Exam   Triage Vital Signs: ED Triage Vitals  Encounter Vitals Group     BP      Girls Systolic BP Percentile      Girls Diastolic BP Percentile      Boys Systolic BP Percentile      Boys Diastolic BP Percentile      Pulse      Resp      Temp      Temp src      SpO2      Weight      Height      Head Circumference      Peak Flow      Pain Score      Pain Loc      Pain Education      Exclude from Growth Chart     Most recent vital signs: Vitals:   10/09/24 2000 10/09/24 2141  BP: (!) 164/73 (!) 147/72  Pulse: 61 60  Resp: 18 18  Temp:    SpO2: 100% 94%     General: Alert, relatively comfortable appearing, no distress.  CV:  Good peripheral perfusion.  Resp:  Normal effort.  Rhonchi and rales bilaterally, worse on the right. Abd:  No distention.  Other:  No peripheral edema.   ED Results / Procedures / Treatments   Labs (all labs ordered are listed, but only abnormal results are displayed) Labs Reviewed  COMPREHENSIVE METABOLIC PANEL WITH GFR - Abnormal; Notable for the following components:      Result Value   Glucose, Bld 173 (*)     Calcium 8.6 (*)    All other components within normal limits  PRO BRAIN NATRIURETIC PEPTIDE - Abnormal; Notable for the following components:   Pro Brain Natriuretic Peptide 2,715.0 (*)    All other components within normal limits  CBC WITH DIFFERENTIAL/PLATELET - Abnormal; Notable for the following components:   Hemoglobin 10.9 (*)    HCT 34.8 (*)    RDW 15.7 (*)    All other components within normal limits  URINALYSIS, W/ REFLEX TO CULTURE (INFECTION SUSPECTED) - Abnormal; Notable for the following components:   Color, Urine YELLOW (*)    APPearance CLEAR (*)    All other components within normal limits  GLUCOSE, CAPILLARY - Abnormal; Notable for the following components:   Glucose-Capillary 151 (*)    All other components within normal limits  RESP PANEL BY RT-PCR (RSV, FLU A&B, COVID)  [id number]  CULTURE, BLOOD (ROUTINE X 2)  CULTURE, BLOOD (ROUTINE X 2)  LACTIC ACID,  PLASMA  MAGNESIUM  BASIC METABOLIC PANEL WITH GFR  CBC  HEMOGLOBIN A1C  PROCALCITONIN  TROPONIN T, HIGH SENSITIVITY  TROPONIN T, HIGH SENSITIVITY     EKG  ED ECG REPORT I, Waylon Cassis, the attending physician, personally viewed and interpreted this ECG.  Date: 10/09/2024 EKG Time: 1742 Rate: 79 Rhythm: normal sinus rhythm QRS Axis: Left axis Intervals: normal ST/T Wave abnormalities: Nonspecific T wave abnormality Narrative Interpretation: no evidence of acute ischemia    RADIOLOGY  Chest x-ray: I independently viewed and interpreted the images; there are bilateral lower lobe opacities consistent with pneumonia  PROCEDURES:  Critical Care performed: No  Procedures   MEDICATIONS ORDERED IN ED: Medications  sodium chloride  flush (NS) 0.9 % injection 3 mL (3 mLs Intravenous Given 10/09/24 2139)  acetaminophen  (TYLENOL ) tablet 650 mg (has no administration in time range)    Or  acetaminophen  (TYLENOL ) suppository 650 mg (has no administration in time range)  senna-docusate  (Senokot-S) tablet 1 tablet (has no administration in time range)  ondansetron  (ZOFRAN ) tablet 4 mg (has no administration in time range)    Or  ondansetron  (ZOFRAN ) injection 4 mg (has no administration in time range)  insulin  aspart (novoLOG ) injection 0-9 Units (has no administration in time range)  sertraline  (ZOLOFT ) tablet 100 mg (100 mg Oral Given 10/09/24 2138)  Vonoprazan Fumarate  TABS 1 tablet (has no administration in time range)  sucralfate  (CARAFATE ) tablet 1 g (1 g Oral Given 10/09/24 2135)  pantoprazole  (PROTONIX ) EC tablet 40 mg (40 mg Oral Given 10/09/24 2138)  famotidine  (PEPCID ) tablet 40 mg (40 mg Oral Given 10/09/24 2137)  levothyroxine  (SYNTHROID ) tablet 100 mcg (has no administration in time range)  oxyCODONE -acetaminophen  (PERCOCET/ROXICET) 5-325 MG per tablet 1 tablet (1 tablet Oral Given 10/09/24 2133)    And  oxyCODONE  (Oxy IR/ROXICODONE ) immediate release tablet 5 mg (has no administration in time range)  cefTRIAXone  (ROCEPHIN ) 2 g in sodium chloride  0.9 % 100 mL IVPB (2 g Intravenous New Bag/Given 10/09/24 1953)  azithromycin  (ZITHROMAX ) tablet 500 mg (500 mg Oral Given 10/09/24 1955)     IMPRESSION / MDM / ASSESSMENT AND PLAN / ED COURSE  I reviewed the triage vital signs and the nursing notes.  84 year old female with PMH as noted above presents with cough, shortness of breath, and hypoxia to the 80s after she believes that she aspirated yesterday.  Differential diagnosis includes, but is not limited to, aspiration pneumonia, other pneumonia, acute bronchitis, COVID-19 or other viral syndrome, less likely cardiac etiology.  Will obtain chest x-ray, lab workup, and reassess.  Patient's presentation is most consistent with acute presentation with potential threat to life or bodily function.  The patient is on the cardiac monitor to evaluate for evidence of arrhythmia and/or significant heart rate changes.   ----------------------------------------- 7:37 PM on  10/09/2024 -----------------------------------------  Chest x-ray shows bilateral lower lobe opacities consistent with pneumonia.  Respiratory panel is negative.  Lactate is normal.  CBC shows no leukocytosis.  Given the hypoxia, the patient will benefit from admission for further management.  I have ordered empiric antibiotics to cover for CAP.  I consulted Dr. Fernand from the hospitalist service; based on our discussion he agrees to evaluate the patient for admission.   FINAL CLINICAL IMPRESSION(S) / ED DIAGNOSES   Final diagnoses:  Community acquired pneumonia, unspecified laterality     Rx / DC Orders   ED Discharge Orders     None        Note:  This document  was prepared using Conservation officer, historic buildings and may include unintentional dictation errors.    Jacolyn Pae, MD 10/09/24 2212  "

## 2024-10-09 NOTE — H&P (Signed)
 " History and Physical    Kristy Archer FMW:[id number] DOB: [birth year] DOA: 10/09/2024  DOS: the patient was seen and examined on 10/09/2024  PCP: Zachary Lamar BRAVO, NP   Patient coming from: Home  I have personally briefly reviewed patient's old medical records in [facility] and CareEverywhere  HPI:   Kristy Archer is a 84 y.o. year old female with medical history of hypertension, hyperlipidemia, type 2 diabetes, history of PE, paroxysmal A-fib, GERD, hypothyroidism presenting to the ED with worsening shortness of breath.  She states she felt like she choked on some food and has been having coughing and shortness of breath since then.  States this happens 2-3 times a year. She denies any other URI symptoms. Denies any fevers or chills.  Given worsening shortness of breath, she called EMS.  On ROS, she states it feels funny when she urinates and wants to get checked for UTI. On arrival to the ED patient was noted to be HDS stable.  Lab work and imaging obtained.  CBC without leukocytosis, mild anemia at baseline.  CMP overall unremarkable except for moderate hyperglycemia.  Troponin normal.  Lactic acid normal.  Respiratory panel negative for COVID, flu, RSV.  Imaging obtained that showed bilateral opacities concerning for pneumonia.  Blood cultures obtained patient started on IV antibiotics.  Given need for further care, [facility] contacted for admission.  Review of Systems: As mentioned in the history of present illness. All other systems reviewed and are negative.   Past Medical History:  Diagnosis Date   Acute deep vein thrombosis (DVT) of axillary vein of left upper extremity (HCC) 12/14/2023   Age-related osteoporosis without current pathological fracture 12/03/2021   Breast swelling 08/26/2023   Chest pain    Chest pain of uncertain etiology 12/05/2021   Chronic atrial fibrillation, unspecified (HCC) 01/16/2014   Last Assessment & Plan:  Formatting of this note might be different  from the original. -Home meds: Sotalol  80 mg twice daily, Xarelto 20 mg daily -Sotalol  dose had to be temporarily reduced due to slight decrease in renal functions however it was returned to her home dose.  -Heart rate mostly in the 50s, QTC on EKG 11/30/18  478 msec -Xarelto had to be held on admission for insertion of chest tube    Chronic back pain 11/25/2018   Last Assessment & Plan:  Formatting of this note might be different from the original. - Continue home pain medication  - Added miralax daily prn   Chronic pain syndrome 12/03/2021   Constipation 12/03/2018   Last Assessment & Plan:  Formatting of this note might be different from the original. 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 - History of hiatal hernia status post fundoplication in 2005.  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  History of multiple EGDs for dilatation. - S/p thoracotomy with partial gastrectomy/esophagectomy in 2008 with thoracic esophagogastric    Hypomagnesemia 12/01/2018   Last Assessment &  Plan:  Formatting of this note might be different from the original. Replaced during hospitalization.   Hypothyroidism 01/16/2014   Last Assessment & Plan:  Formatting of this note might be different from the original. 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/03/2021   Mixed hyperlipidemia 12/03/2021   Nausea 07/18/2024   OSA (obstructive sleep apnea) 04/16/2021   Other long term (current) drug therapy 12/03/2021   Other vitamin B12 deficiency anemias 12/03/2021   Pain in left hip 12/03/2021   Pain in right foot 12/03/2021   Pain in right knee 12/03/2021   Primary generalized (osteo)arthritis 12/03/2021   Primary osteoarthritis, right ankle and foot 12/03/2021   Protein-calorie malnutrition, severe 07/15/2023   Recurrent major depression in full remission 12/03/2021   Tinea unguium 12/03/2021   Type 2 diabetes mellitus without complications (HCC) 12/03/2021   Unspecified severe protein-calorie malnutrition 12/03/2021   Urinary tract infectious disease 12/03/2021   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 Authorizing Provider  aminophylline  injection Inject 3 mLs (75 mg total) into the vein once for 1 dose. 06/07/23 07/28/24  Furth, Cadence H, PA-C  amLODipine  (NORVASC ) 2.5 MG tablet Take 2.5 mg by mouth daily.    [provider]  apixaban  (ELIQUIS ) 2.5 MG TABS tablet Take 1 tablet (2.5 mg total) by mouth 2 (two) times daily. 05/15/24   Brown, Fallon E, NP  cyanocobalamin  1000 MCG tablet Take 1,000 mcg by mouth daily.    [provider]  estradiol  (ESTRACE ) 0.1 MG/GM vaginal cream Estrogen Cream Instruction Discard applicator Apply pea sized amount to tip of finger to urethra before bed. Wash hands well after application. Use Monday, Wednesday and Friday 06/01/23   Francisca Redell BROCKS, MD  famotidine  (PEPCID ) 40 MG tablet  Take 40 mg by mouth at bedtime. 10/27/21   [provider]  levothyroxine  (SYNTHROID ) 100 MCG tablet Take 100 mcg by mouth every morning.    [provider]  meclizine (ANTIVERT) 25 MG tablet Take 25 mg by mouth 3 (three) times daily as needed for dizziness.    [provider]  metoCLOPramide (REGLAN) 10 MG tablet Take 10 mg by mouth 2 (two) times daily before a meal.    [provider]  nitrofurantoin , macrocrystal-monohydrate, (MACROBID ) 100 MG capsule Take 1 capsule (100 mg total) by mouth daily. 06/01/23   Francisca Redell BROCKS, MD  omeprazole (PRILOSEC) 40 MG capsule Take 40 mg by mouth 2 (two) times daily. 12/10/23   [provider]  ondansetron  (ZOFRAN ) 8 MG tablet Take 8 mg by mouth every 8 (eight) hours as needed.    [provider]  oxyCODONE -acetaminophen  (PERCOCET) 10-325 MG tablet Take 1 tablet by mouth 4 (four) times daily as needed for pain. 11/21/21   [provider]  sertraline  (ZOLOFT ) 100 MG tablet Take 100 mg by mouth daily. 10/16/21   [provider]  simvastatin  (ZOCOR ) 20 MG tablet Take 20 mg by mouth every evening. 10/18/21  [provider]  sodium chloride  1 g tablet Take 1 g by mouth 2 (two) times daily. 11/08/23   [provider]  sucralfate  (CARAFATE ) 1 g tablet Take 1 g by mouth 4 (four) times daily. 11/13/21   [provider]  VOQUEZNA  20 MG TABS Take 1 tablet by mouth daily. 07/06/23   [provider]    Social History:  reports that she has never smoked. She has never been exposed to tobacco smoke. She has never used smokeless tobacco. She reports that she does not currently use alcohol. No history on file for drug use.    Physical Exam: Vitals:   10/09/24 1739 10/09/24 1741 10/09/24 1743 10/09/24 1744  BP:    (!) 170/92  Pulse: 89     Resp: 13     Temp: 98.8 F (37.1 C)     TempSrc: Oral     SpO2: 98%     Weight:   41.6 kg   Height:  5' 2 (1.575 m)       Gen: NAD, pleasant female HENT: NCAT, [place] in place CV: normal heart sounds Lung: CTAB Abd: No TTP, normal bowel sounds MSK: No asymmetry, decreased bulk and tone Neuro: alert and oriented x4   Labs on Admission: I have personally reviewed following labs and imaging studies  CBC: Recent Labs  Lab 10/09/24 1755  WBC 7.2  NEUTROABS 6.1  HGB 10.9*  HCT 34.8*  MCV 84.7  PLT 217   Basic Metabolic Panel: Recent Labs  Lab 10/09/24 1755  NA 137  K 4.4  CL 98  CO2 31  GLUCOSE 173*  BUN 14  CREATININE 0.58  CALCIUM 8.6*   GFR: Estimated Creatinine Clearance: 35.6 mL/min (by C-G formula based on SCr of 0.58 mg/dL). Liver Function Tests: Recent Labs  Lab 10/09/24 1755  AST 17  ALT 8  ALKPHOS 88  BILITOT 0.2  PROT 6.7  ALBUMIN 3.6   No results for input(s): LIPASE, AMYLASE in the last 168 hours. No results for input(s): AMMONIA in the last 168 hours. Coagulation Profile: No results for input(s): INR, PROTIME in the last 168 hours. Cardiac Enzymes: No results for input(s): CKTOTAL, CKMB, CKMBINDEX, TROPONINI, TROPONINIHS in the last 168 hours. BNP (last 3 results) No results for input(s): BNP in the last 8760 hours. HbA1C: No results for input(s): HGBA1C in the last 72 hours. CBG: No results for input(s): GLUCAP in the last 168 hours. Lipid Profile: No results for input(s): CHOL, HDL, LDLCALC, TRIG, CHOLHDL, LDLDIRECT in the last 72 hours. Thyroid  Function Tests: No results for input(s): TSH, T4TOTAL, FREET4, T3FREE, THYROIDAB in the last 72 hours. Anemia Panel: No results for input(s): VITAMINB12, FOLATE, FERRITIN, TIBC, IRON, RETICCTPCT in the last 72 hours. Urine analysis:    Component Value Date/Time   COLORURINE YELLOW (A) 07/16/2023 0237   APPEARANCEUR HAZY (A) 07/16/2023 0237   LABSPEC 1.018 07/16/2023 0237   PHURINE 5.0 07/16/2023 0237   GLUCOSEU NEGATIVE 07/16/2023 0237   HGBUR SMALL  (A) 07/16/2023 0237   BILIRUBINUR NEGATIVE 07/16/2023 0237   KETONESUR NEGATIVE 07/16/2023 0237   PROTEINUR NEGATIVE 07/16/2023 0237   NITRITE NEGATIVE 07/16/2023 0237   LEUKOCYTESUR NEGATIVE 07/16/2023 0237    Radiological Exams on Admission: I have personally reviewed images DG Chest 2 View Result Date: 10/09/2024 EXAM: 2 VIEW(S) XRAY OF THE CHEST 10/09/2024 06:09:00 PM COMPARISON: CT 11/04/2023. CLINICAL HISTORY: Shortness of breath. FINDINGS: LUNGS AND PLEURA: Low lung volumes. Bilateral lower lobe airspace opacities, left greater  than right concerning for pneumonia. No pleural effusion. No pneumothorax. HEART AND MEDIASTINUM: Hertz borderline in size. BONES AND SOFT TISSUES: No acute osseous abnormality. IMPRESSION: 1. Bilateral lower lobe airspace opacities, left greater than right, concerning for pneumonia. Electronically signed by: Franky Crease MD 10/09/2024 06:25 PM EST RP Workstation: [id number]    EKG: My personal interpretation of EKG shows: Normal sinus rhythm without any acute ST changes.    Assessment/Plan Principal Problem:   Acute hypoxic respiratory failure (HCC) Active Problems:   Essential hypertension   Hypothyroidism   Mixed hyperlipidemia   OSA (obstructive sleep apnea)   Type 2 diabetes mellitus without complications (HCC)   Patient with acute hypoxic respiratory failure secondary to pneumonia.  Image findings consistent with pneumonia vs pneumonitis 2/2 to aspiration. Blood cultures ordered. Pt is status post ceftriaxone  and azithromycin . Will need CAP coverage for aspiration pneumonia. Procal added to am labs. Given hx of strictures, will place speech evaluation.  -Continue Ceftriaxone  and Azithromycin   -Monitor fever curve, trend WBC -Follow up blood cultures  -Wean oxygen as able -Continuous pluse ox  Hypertension: Patient is hypertensive here.  Continue home amlodipine .  Can use IV labetalol as needed.  Hyperlipidemia: Continue home simvastatin  20  mg.  Hypothyroidism: Continue home Synthroid   Type 2 diabetes: Start SSI.  GERD: Patient has severe esophagitis.  She follows with gastroenterology.  Her home regimen is Carafate , Voquenza, H2 blocker and PPI.  Continue while patient is admitted.  MDD/GAD: Continue home SSRI  History of DVT: Continue home Eliquis .  Concern for UTI: by pt and family. UA added. Discussed above abx should cover if she had UTI and it is not recommended to check for UTI if no symptoms.   VTE prophylaxis:  Eliquis   Diet: N.p.o. except meds Code Status:  Full Code Telemetry:  Admission status: Observation, Med-Surg Patient is from: Home Anticipated d/c is to: Home Anticipated d/c is in: 1-2 days   Family Communication: Updated at bedside  Consults called: None   Severity of Illness: The appropriate patient status for this patient is OBSERVATION. Observation status is judged to be reasonable and necessary in order to provide the required intensity of service to ensure the patient's safety. The patient's presenting symptoms, physical exam findings, and initial radiographic and laboratory data in the context of their medical condition is felt to place them at decreased risk for further clinical deterioration. Furthermore, it is anticipated that the patient will be medically stable for discharge from the hospital within 2 midnights of admission.    Morene Bathe, MD [facility]. [facility]     [1]  Allergies Allergen Reactions   Codeine Anaphylaxis    Other reaction(s): Unknown Can take morphine  per patient; also tolerates hydromorphone     Linezolid     Other reaction(s): Angioedema (ALLERGY/intolerance)   Sulfa Antibiotics Itching and Rash   "

## 2024-10-09 NOTE — ED Triage Notes (Signed)
 Pt in via [facility] from home for SOB since saturday. Per pt she aspirated on saturday and has been having SOB since then. Per [facility] O2 was 89% on RA. Placed on 2L

## 2024-10-10 ENCOUNTER — Other Ambulatory Visit: Payer: Self-pay

## 2024-10-10 DIAGNOSIS — J189 Pneumonia, unspecified organism: Secondary | ICD-10-CM | POA: Diagnosis not present

## 2024-10-10 DIAGNOSIS — I10 Essential (primary) hypertension: Secondary | ICD-10-CM

## 2024-10-10 DIAGNOSIS — E782 Mixed hyperlipidemia: Secondary | ICD-10-CM | POA: Diagnosis not present

## 2024-10-10 DIAGNOSIS — R1310 Dysphagia, unspecified: Secondary | ICD-10-CM | POA: Diagnosis not present

## 2024-10-10 DIAGNOSIS — J9601 Acute respiratory failure with hypoxia: Secondary | ICD-10-CM | POA: Diagnosis not present

## 2024-10-10 LAB — CBC
HCT: 30.9 % — ABNORMAL LOW (ref 36.0–46.0)
Hemoglobin: 9.7 g/dL — ABNORMAL LOW (ref 12.0–15.0)
MCH: 26.9 pg (ref 26.0–34.0)
MCHC: 31.4 g/dL (ref 30.0–36.0)
MCV: 85.6 fL (ref 80.0–100.0)
Platelets: 203 K/uL (ref 150–400)
RBC: 3.61 MIL/uL — ABNORMAL LOW (ref 3.87–5.11)
RDW: 15.5 % (ref 11.5–15.5)
WBC: 5.9 K/uL (ref 4.0–10.5)
nRBC: 0 % (ref 0.0–0.2)

## 2024-10-10 LAB — BASIC METABOLIC PANEL WITH GFR
Anion gap: 6 (ref 5–15)
BUN: 11 mg/dL (ref 8–23)
CO2: 30 mmol/L (ref 22–32)
Calcium: 8.2 mg/dL — ABNORMAL LOW (ref 8.9–10.3)
Chloride: 99 mmol/L (ref 98–111)
Creatinine, Ser: 0.47 mg/dL (ref 0.44–1.00)
GFR, Estimated: >60 mL/min
Glucose, Bld: 123 mg/dL — ABNORMAL HIGH (ref 70–99)
Potassium: 4.3 mmol/L (ref 3.5–5.1)
Sodium: 135 mmol/L (ref 135–145)

## 2024-10-10 LAB — GLUCOSE, CAPILLARY
Glucose-Capillary: 82 mg/dL (ref 70–99)
Glucose-Capillary: 95 mg/dL (ref 70–99)
Glucose-Capillary: 96 mg/dL (ref 70–99)

## 2024-10-10 LAB — HEMOGLOBIN A1C
Hgb A1c MFr Bld: 5.4 % (ref 4.8–5.6)
Mean Plasma Glucose: 108.28 mg/dL

## 2024-10-10 LAB — PROCALCITONIN: Procalcitonin: <0.1 ng/mL

## 2024-10-10 MED ORDER — PANTOPRAZOLE SODIUM 40 MG PO TBEC
40.0000 mg | DELAYED_RELEASE_TABLET | Freq: Two times a day (BID) | ORAL | Status: DC
  Administered 2024-10-10: 40 mg via ORAL
  Filled 2024-10-10: qty 1

## 2024-10-10 MED ORDER — DOXYCYCLINE HYCLATE 100 MG PO TABS
100.0000 mg | ORAL_TABLET | Freq: Two times a day (BID) | ORAL | 0 refills | Status: AC
  Filled 2024-10-10: qty 10, 5d supply, fill #0

## 2024-10-10 NOTE — Consult Note (Signed)
 "      Rogelia Copping, MD [facility]  [address] [place], [place] [address] Phone: [phone] Fax : [phone]  Consultation  Referring Provider:     Dr. Maree Primary Care Physician:  Zachary Lamar BRAVO, NP Primary Gastroenterologist:     Ozell CHRISTELLA Constant, MD       Reason for Consultation:     Dysphagia  Date of Admission:  10/09/2024 Date of Consultation:  10/10/2024         HPI:   Kristy Archer is a 84 y.o. 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  The patient then had a go through surgery and had part of her stomach and her esophagus removed as reported by the daughter.  The patient then had a gastric pull-through with the stomach brought up to the esophagus and anastomosis done.  The patient's daughter states that the patient has had multiple EGDs in the past for dysphagia and a stricture seen.  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 Dilation caused improved passage of the scope and improved lumen appearance. Dilated to 12 mm with TTS balloon Signs of previous surgery in the GE  unction, cardia and fundus of the stomach The duodenal bulb and 2nd part of the duodenum appeared normal.   The patient has a history of hypertension hyperlipidemia diabetes and A-fib.  The patient also has a history of GERD hypothyroidism and came to the ED with shortness of breath.  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  The patient and the daughter both agree that her past dilation have not done any help with her problems of dysphagia and dilation has only been done up  to 12 mm most recently due to the risk of perforation of the anastomotic stricture.  Past Medical History:  Diagnosis Date   Acute deep vein thrombosis (DVT) of axillary vein of left upper extremity (HCC) 12/14/2023   Age-related osteoporosis without current pathological fracture 12/03/2021   Breast swelling 08/26/2023   Chest pain    Chest pain of uncertain etiology 12/05/2021   Chronic atrial fibrillation, unspecified (HCC) 01/16/2014   Last Assessment & Plan:  Formatting of this note might be different from the original. -Home meds: Sotalol  80 mg twice daily, Xarelto 20 mg daily -Sotalol  dose had to be temporarily reduced due to slight decrease in renal functions however it was returned to her home dose.  -Heart rate mostly in the 50s, QTC on EKG 11/30/18  478 msec -Xarelto had to be held on admission for insertion of chest tube    Chronic back pain 11/25/2018   Last Assessment & Plan:  Formatting of this note might be different from the original. - Continue home pain medication  - Added miralax daily prn   Chronic pain syndrome 12/03/2021   Constipation 12/03/2018   Last Assessment & Plan:  Formatting of this note might be different from the original. 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 - History of hiatal hernia status post fundoplication in 2005.  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  History of multiple EGDs for dilatation. - S/p thoracotomy with partial gastrectomy/esophagectomy in 2008 with thoracic esophagogastric    Hypomagnesemia 12/01/2018   Last Assessment & Plan:  Formatting of this note might be different from the original. Replaced during hospitalization.   Hypothyroidism 01/16/2014   Last Assessment & Plan:  Formatting of this note might be different from the original. 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 Authorizing Provider  aminophylline  injection Inject 3 mLs (75 mg total) into the vein once for 1 dose. 06/07/23 07/28/24  Furth, Cadence H, PA-C  amLODipine  (NORVASC ) 2.5 MG tablet Take 2.5 mg by mouth daily.    [provider]  apixaban  (ELIQUIS ) 2.5 MG TABS tablet Take 1 tablet (2.5 mg total) by mouth 2 (two) times daily. 05/15/24   Brown, Fallon E, NP  cyanocobalamin  1000 MCG tablet  Take 1,000 mcg by mouth daily.    [provider]  estradiol  (ESTRACE ) 0.1 MG/GM vaginal cream Estrogen Cream Instruction Discard applicator Apply pea sized amount to tip of finger to urethra before bed. Wash hands well after application. Use Monday, Wednesday and Friday 06/01/23   Francisca Redell BROCKS, MD  famotidine  (PEPCID ) 40 MG tablet Take 40 mg by mouth at bedtime. 10/27/21   [provider]  levothyroxine  (SYNTHROID ) 100 MCG tablet Take 100 mcg by mouth every morning.    [provider]  meclizine (ANTIVERT) 25 MG tablet Take 25 mg by mouth 3 (three) times daily as needed for dizziness.    [provider]  metoCLOPramide (REGLAN) 10 MG tablet Take 10 mg by mouth 2 (two) times daily before a meal.    [provider]  nitrofurantoin , macrocrystal-monohydrate, (MACROBID ) 100 MG capsule Take 1 capsule (100 mg total) by mouth daily. 06/01/23   Francisca Redell BROCKS, MD  omeprazole (PRILOSEC) 40 MG capsule Take 40 mg by mouth 2 (two) times daily. 12/10/23  [provider]  ondansetron  (ZOFRAN ) 8 MG tablet Take 8 mg by mouth every 8 (eight) hours as needed.    [provider]  oxyCODONE -acetaminophen  (PERCOCET) 10-325 MG tablet Take 1 tablet by mouth 4 (four) times daily as needed for pain. 11/21/21   [provider]  sertraline  (ZOLOFT ) 100 MG tablet Take 100 mg by mouth daily. 10/16/21   [provider]  simvastatin  (ZOCOR ) 20 MG tablet Take 20 mg by mouth every evening. 10/18/21   [provider]  sodium chloride  1 g tablet Take 1 g by mouth 2 (two) times daily. 11/08/23   [provider]  sucralfate  (CARAFATE ) 1 g tablet Take 1 g by mouth 4 (four) times daily. 11/13/21   [provider]  VOQUEZNA  20 MG TABS Take 1 tablet by mouth daily. 07/06/23   [provider]    Family History  Problem Relation Age of Onset   Diabetes Mother    Cancer Mother    Heart disease Father    Diabetes Father     Hypertension Father      Social History[1]  Allergies as of 10/09/2024 - Review Complete 10/09/2024  Allergen Reaction Noted   Codeine Anaphylaxis 08/24/2012   Linezolid  12/15/2018   Sulfa antibiotics Itching and Rash 12/05/2021    Review of Systems:    All systems reviewed and negative except where noted in HPI.   Physical Exam:  Vital signs in last 24 hours: Temp:  [97.7 F (36.5 C)-98.8 F (37.1 C)] 97.7 F (36.5 C) (01/20 0737) Pulse Rate:  [53-89] 54 (01/20 0737) Resp:  [13-19] 17 (01/20 0737) BP: (147-172)/(62-92) 158/63 (01/20 0737) SpO2:  [94 %-100 %] 98 % (01/20 0737) Weight:  [41.6 kg-42.1 kg] 42.1 kg (01/20 0500) Last BM Date : 10/08/24 General:   Pleasant, cooperative in NAD Head:  Normocephalic and atraumatic. Eyes:   No icterus.   Conjunctiva pink. PERRLA. Ears:  Normal auditory acuity. Neck:  Supple; no masses or thyroidomegaly Lungs: Respirations even and unlabored. Lungs clear to auscultation bilaterally.   No wheezes, crackles, or rhonchi.  Heart:  Regular rate and rhythm;  Without murmur, clicks, rubs or gallops Abdomen:  Soft, nondistended, nontender. Normal bowel sounds. No appreciable masses or hepatomegaly.  No rebound or guarding.  Rectal:  Not performed. Msk:  Symmetrical without gross deformities.    Extremities:  Without edema, cyanosis or clubbing. Neurologic:  Alert and oriented x3;  grossly normal neurologically except for significantly hard of hearing. Skin:  Intact without significant lesions or rashes. Cervical Nodes:  No significant cervical adenopathy. Psych:  Alert and cooperative. Normal affect.  LAB RESULTS: Recent Labs    10/09/24 1755 10/10/24 0334  WBC 7.2 5.9  HGB 10.9* 9.7*  HCT 34.8* 30.9*  PLT 217 203   BMET Recent Labs    10/09/24 1755 10/10/24 0334  NA 137 135  K 4.4 4.3  CL 98 99  CO2 31 30  GLUCOSE 173* 123*  BUN 14 11  CREATININE 0.58 0.47  CALCIUM 8.6* 8.2*   LFT Recent Labs    10/09/24 1755   PROT 6.7  ALBUMIN 3.6  AST 17  ALT 8  ALKPHOS 88  BILITOT 0.2   PT/INR No results for input(s): LABPROT, INR in the last 72 hours.  STUDIES: DG Chest 2 View Result Date: 10/09/2024 EXAM: 2 VIEW(S) XRAY OF THE CHEST 10/09/2024 06:09:00 PM COMPARISON: CT 11/04/2023. CLINICAL HISTORY: Shortness of breath. FINDINGS: LUNGS AND PLEURA: Low lung volumes. Bilateral  lower lobe airspace opacities, left greater than right concerning for pneumonia. No pleural effusion. No pneumothorax. HEART AND MEDIASTINUM: Hertz borderline in size. BONES AND SOFT TISSUES: No acute osseous abnormality. IMPRESSION: 1. Bilateral lower lobe airspace opacities, left greater than right, concerning for pneumonia. Electronically signed by: Franky Crease MD 10/09/2024 06:25 PM EST RP Workstation: [id number]      Impression / Plan:   Assessment: Principal Problem:   Acute hypoxic respiratory failure (HCC) Active Problems:   Essential hypertension   Hypothyroidism   Mixed hyperlipidemia   OSA (obstructive sleep apnea)   Type 2 diabetes mellitus without complications (HCC)   Kristy Archer is a 84 y.o. y/o female with a history of a gastrectomy and esophagectomy with gastric esophageal anastomosis with a history of dysphagia and a stricture at the anastomosis.  The patient was dilated in the past multiple times as reported by the daughter and her last dilation was done back in January 2025.  At that time the esophagus was dilated to 12 mm and the patient and the daughter report that there is no improvement in her symptoms.  There is a report of the patient also having esophageal dysmotility as a baseline.  Plan:  I have spoken to the patient's daughter and have explained that the options include continued diet modification to try and avoid aspiration knowing that aspiration can happen again or to proceed with something more invasive like an EGD.  The daughter states that she does not want to proceed with any  endoscopic evaluation at this time since it has not helped in the past and with the patient's advancing age and risk of perforation she would not like to have any further dilation of the esophagus.  I have explained that another option would be enteral feedings with a J-tube or G-tube.  The G-tube will not decrease the risk of the patient's aspiration while her J-tube may.  A surgical J-tube can be placed without passing the tube through the stricture which may limit the possibility of passing a G-tube during an endoscopy.  The patient's daughter states that she will talk to her mother about the options and will let me know what they decide.  Thank you for involving me in the care of this patient.      LOS: 0 days   Rogelia Copping, MD, MD. NOLIA 10/10/2024, 1:57 PM,  Pager [phone] 7am-5pm  Check AMION for 5pm -7am coverage and on weekends   Note: This dictation was prepared with Dragon dictation along with smaller phrase technology. Any transcriptional errors that result from this process are unintentional.       [1]  Social History Tobacco Use   Smoking status: Never    Passive exposure: Never   Smokeless tobacco: Never  Substance Use Topics   Alcohol use: Not Currently   "

## 2024-10-10 NOTE — Plan of Care (Signed)
" °  Problem: Coping: Goal: Ability to adjust to condition or change in health will improve Outcome: Progressing   Problem: Metabolic: Goal: Ability to maintain appropriate glucose levels will improve Outcome: Progressing   Problem: Education: Goal: Knowledge of General Education information will improve Description: Including pain rating scale, medication(s)/side effects and non-pharmacologic comfort measures Outcome: Progressing   Problem: Clinical Measurements: Goal: Diagnostic test results will improve Outcome: Progressing   Problem: Coping: Goal: Level of anxiety will decrease Outcome: Progressing   Problem: Pain Managment: Goal: General experience of comfort will improve and/or be controlled Outcome: Progressing   Problem: Safety: Goal: Ability to remain free from injury will improve Outcome: Progressing   "

## 2024-10-10 NOTE — Evaluation (Signed)
 Clinical/Bedside Swallow Evaluation Patient Details  Name: Kristy Archer MRN: [id number] Date of Birth: [birth year]  Today's Date: 10/10/2024 Time: SLP Start Time (ACUTE ONLY): 0940 SLP Stop Time (ACUTE ONLY): 1030 SLP Time Calculation (min) (ACUTE ONLY): 50 min  Past Medical History:  Past Medical History:  Diagnosis Date   Acute deep vein thrombosis (DVT) of axillary vein of left upper extremity (HCC) 12/14/2023   Age-related osteoporosis without current pathological fracture 12/03/2021   Breast swelling 08/26/2023   Chest pain    Chest pain of uncertain etiology 12/05/2021   Chronic atrial fibrillation, unspecified (HCC) 01/16/2014   Last Assessment & Plan:  Formatting of this note might be different from the original. -Home meds: Sotalol  80 mg twice daily, Xarelto 20 mg daily -Sotalol  dose had to be temporarily reduced due to slight decrease in renal functions however it was returned to her home dose.  -Heart rate mostly in the 50s, QTC on EKG 11/30/18  478 msec -Xarelto had to be held on admission for insertion of chest tube    Chronic back pain 11/25/2018   Last Assessment & Plan:  Formatting of this note might be different from the original. - Continue home pain medication  - Added miralax daily prn   Chronic pain syndrome 12/03/2021   Constipation 12/03/2018   Last Assessment & Plan:  Formatting of this note might be different from the original. 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 - History of hiatal hernia status post fundoplication in 2005.  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  History of multiple EGDs for dilatation. - S/p thoracotomy with partial gastrectomy/esophagectomy in 2008 with thoracic esophagogastric    Hypomagnesemia 12/01/2018   Last Assessment & Plan:  Formatting of this note might be different from the original. Replaced during hospitalization.   Hypothyroidism 01/16/2014   Last Assessment & Plan:  Formatting of this note might be different from the original. 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   Mixed hyperlipidemia 12/03/2021   Nausea 07/18/2024   OSA (obstructive sleep apnea) 04/16/2021   Other long term (current) drug therapy 12/03/2021   Other vitamin B12 deficiency anemias 12/03/2021   Pain in left hip 12/03/2021   Pain in right foot 12/03/2021   Pain in right knee 12/03/2021   Primary generalized (osteo)arthritis 12/03/2021   Primary osteoarthritis, right ankle and foot 12/03/2021   Protein-calorie malnutrition, severe 07/15/2023   Recurrent major depression in full remission 12/03/2021   Tinea unguium 12/03/2021   Type 2 diabetes mellitus without complications (HCC) 12/03/2021   Unspecified severe protein-calorie malnutrition 12/03/2021   Urinary tract infectious disease 12/03/2021   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 year old female with medical history of MULTIPLE medical issues to include GI History of Esophagectomy, Esophageal Dysmotility and Dilations, GERD, Esophageal Regurgitation, Malnutrition- Severe(current bmi this admit is 16.98),  Chronic pain, IBS, OSA, osteoporosis, hypertension, hyperlipidemia, type 2 diabetes, history of PE, paroxysmal A-fib, hypothyroidism presenting to the ED with worsening shortness of breath.  She states she felt like she choked on some food and has been having coughing and shortness of breath since then.  States this happens 2-3 times a year.  Pt has a Long-standing H/O this presentation.  Pt appears Deconditioned; she is Severely HOH.    Assessment / Plan / Recommendation  Clinical Impression   Pt seen for BSE this morning. Pt awake, resting in bed. Severely HOH(R ear best). Pt was able to answer basic questions re: self; gave information re: her Esophageal phase deficits and dysmotility history baseline- see chart. Recommend pt have Palliative Care f/u post D/C to continue to address her Chronic issues as it impacts her oral intake/nutrition overall.  On [place] O2 support- 3L; afebrile, WBC not elevated.    OF NOTE: Pt strongly endorse s/s of REFLUX activity at home; regurgitation and increased phlegm(thick, stringy). She also endorses coughing around/after meals w/ belching.  This has been a CHRONIC, ongoing presentation for several years per pt and chart notes(at least 2020).    Pt appears to present w/ functional oropharyngeal phase swallowing w/ No overt, oropharyngeal phase dysphagia appreciated during oral intake of trials; No neuromuscular swallowing deficits appreciated. Pt appears at reduced risk for aspiration from an oropharyngeal phase standpoint following general aspiration precautions. HOWEVER, pt has a baseline presentation of CHRONIC Esophageal phase Dysmotility and REFLUX episodes/behavior.  ANY Esophageal phase Dysmotility or Regurgitation of REFLUX material can increase risk for aspiration of such during Retrograde flow thus impact Voicing and Pulmonary status. Pt described CHRONIC issues of globus, dry throat clearing/cough, and Phlegm for several years.     Pt sat upright in bed and  consumed several trials of thin liquids Via Cup/Straw, purees, and softened solid foods w/ No overt clinical s/s of aspiration noted; clear vocal quality b/t trials, no decline in pulmonary status, no cough, no decline in O2 sats(98%). Oral phase appeared [facility] for bolus management and timely A-P transfer/clearing of material. Mastication appropriate for boluses. OM exam was [facility] for oral clearing; lingual/labial movements. No unilateral weakness. Speech clear.    Recommend a more MINCED foods diet (well-moistened foods) w/ thin liquids. Pt can f/u w/ GI for ongoing management and upgrade to foods in her diet that she is comfortable with. Recommend general aspiration precautions. Rest Breaks during meals/oral intake to allow for Esophageal clearing. Small bites/sips Slowly and alternate b/t. Chew foods well b/f swallowing. Sit upright during meals. REFLUX precautions strongly recommended to lessen chance for Regurgitation -- HOB elevated post meals and at night when sleeping(30 degrees rec). Pills CRUSHED in Puree as able- f/u w/ Pharmacist.  Noted PPI per chart.   Recommend pt have ongoing f/u w/ GI for assessment/management of Esophageal phase Dysmotility- her CHRONIC issues. Discussion and handouts given on REFLUX, behaviors to manage REFLUX, and foods/diet/prep. No further skilled ST services indicated. Education provided to pt re: suspected impact from GI History of Esophagectomy, Esophageal Dysmotility and Dilations, GERD, Esophageal Regurgitation; Deconditioning; Malnutrition; Dentures. MD to reconsult ST services if any new needs while admitted. NSG updated. Pt appreciative of Education information. Precautions posted in chart, room.   SLP Visit Diagnosis: Dysphagia, unspecified (R13.10) (suspected  impact from GI History of Esophagectomy, Esophageal Dysmotility and Dilations, GERD, Esophageal Regurgitation; Deconditioning; Malnutrition; Dentures)    Aspiration Risk   (reduced from an oropharyngeal  phase standpoint; REFLUX risk though)    Diet Recommendation   Thin;Dysphagia 2 (chopped) (gravies) = a more MINCED foods diet (well-moistened foods) w/ thin liquids. Pt can f/u w/ GI for ongoing management and upgrade to foods in her diet that she is comfortable with. Recommend general aspiration precautions. Rest Breaks during meals/oral intake to allow for Esophageal clearing. Small bites/sips Slowly and alternate b/t. Chew foods well b/f swallowing. Sit upright during meals. REFLUX precautions strongly recommended to lessen chance for Regurgitation -- HOB elevated post meals and at night when sleeping(30 degrees rec).   Medication Administration: Crushed with puree (for clearing ease)    Other Recommendations Recommended Consults: Consider GI evaluation;Consider esophageal assessment (ongoing; Dietician f/u) Oral Care Recommendations: Oral care BID;Oral care before and after PO;Staff/trained caregiver to provide oral care (denture care)     Swallow Evaluation Recommendations  See above   Assistance Recommended at Discharge  Intermittent currently  Functional Status Assessment Patient has not had a recent decline in their functional status (GI based)  Frequency and Duration  (n/a)   (n/a)       Prognosis Prognosis for improved oropharyngeal function: Fair Barriers to Reach Goals: Time post onset;Severity of deficits Barriers/Prognosis Comment: suspected impact from GI History of Esophagectomy, Esophageal Dysmotility and Dilations, GERD, Esophageal Regurgitation; Deconditioning; Malnutrition; Dentures      Swallow Study   General Date of Onset: 10/09/24 HPI: Pt is a 84 y.o. year old female with medical history of MULTIPLE medical issues to include GI History of Esophagectomy, Esophageal Dysmotility and Dilations, GERD, Esophageal Regurgitation, Malnutrition- Severe(current bmi this admit is 16.98), Chronic pain, IBS, OSA, osteoporosis, hypertension, hyperlipidemia, type 2 diabetes,  history of PE, paroxysmal A-fib, hypothyroidism presenting to the ED with worsening shortness of breath.  She states she felt like she choked on some food and has been having coughing and shortness of breath since then.  States this happens 2-3 times a year.  Pt has a Long-standing H/O this presentation.  Pt appears Deconditioned; she is Severely HOH. Type of Study: Bedside Swallow Evaluation Previous Swallow Assessment: 2020- she reported she had been intermittently vomiting food a few times per week then. Diet Prior to this Study: Thin liquids (Level 0) (soft foods at home per pt) Temperature Spikes Noted: No (wbc 5.9) Respiratory Status: Room air History of Recent Intubation: No Behavior/Cognition: Alert;Cooperative;Pleasant mood (HOH) Oral Cavity Assessment: Within Functional Limits Oral Care Completed by SLP: Recent completion by staff Oral Cavity - Dentition: Dentures, top;Dentures, bottom Vision: Functional for self-feeding Self-Feeding Abilities: Able to feed self;Needs set up Patient Positioning: Upright in bed (supported more upright in bed) Baseline Vocal Quality: Normal Volitional Cough: Strong Volitional Swallow: Able to elicit    Oral/Motor/Sensory Function Overall Oral Motor/Sensory Function: Within functional limits   Ice Chips Ice chips: Not tested   Thin Liquid Thin Liquid: Within functional limits Presentation: Cup;Self Fed;Straw (3 trials via cup; 10 trials via straw)    Nectar Thick Nectar Thick Liquid: Not tested   Honey Thick Honey Thick Liquid: Not tested   Puree Puree: Within functional limits Presentation: Self Fed;Spoon (6+ trials)   Solid     Solid: Within functional limits (softened; moist) Presentation: Self Fed;Spoon (5+ trials) Other Comments: enjoyed peaches         Comer Portugal, MS, CCC-SLP Speech Language Pathologist Rehab Services;  [facility] [phone] (ascom) Kushi Kun 10/10/2024,5:52 PM

## 2024-10-10 NOTE — Progress Notes (Signed)
 Discharge criteria met

## 2024-10-10 NOTE — Progress Notes (Signed)
 PT Cancellation Note  Patient Details Name: Kristy Archer MRN: [id number] DOB: 29-Oct-1940   Cancelled Treatment:    Reason Eval/Treat Not Completed: Patient declined, no reason specified. Patient states her stomach is hurting right now, agrees to try after lunch. Will return later for PT eval.    Nataniel Gasper 10/10/2024, 11:46 AM

## 2024-10-10 NOTE — Evaluation (Signed)
 Physical Therapy Evaluation Patient Details Name: Kristy Archer MRN: [id number] DOB: 08-01-1941 Today's Date: 10/10/2024  History of Present Illness  Kristy Archer is a 84 y.o. year old female with medical history of hypertension, hyperlipidemia, type 2 diabetes, history of PE, paroxysmal A-fib, GERD, hypothyroidism presenting to the ED with worsening shortness of breath.  Clinical Impression  Patient received in bed, she requires encouragement to participate, reports she is just not feeling good today. Patient requires min A for bed mobility and pivot transfers to chair. Patient appears to be at or close to baseline and has assistance at home from family at baseline. No skilled PT needs identified at this time. Patient will continue to benefit from mobility while here.           If plan is discharge home, recommend the following: A little help with walking and/or transfers;A little help with bathing/dressing/bathroom   Can travel by private vehicle    yes    Equipment Recommendations None recommended by PT  Recommendations for Other Services       Functional Status Assessment Patient has not had a recent decline in their functional status     Precautions / Restrictions Precautions Precautions: Fall Recall of Precautions/Restrictions: Intact Restrictions Weight Bearing Restrictions Per Provider Order: No      Mobility  Bed Mobility Overal bed mobility: Needs Assistance Bed Mobility: Supine to Sit, Sit to Supine     Supine to sit: Min assist, HOB elevated, Used rails Sit to supine: HOB elevated, Used rails   General bed mobility comments: asks for help with supine to sit, no assist needed to get back into bed other than cues for positioning.    Transfers Overall transfer level: Needs assistance Equipment used: 1 person hand held assist Transfers: Bed to chair/wheelchair/BSC       Squat pivot transfers: Min assist     General transfer comment: patient is able to  pivot bed to chair with min A. Appears to be close to baseline with mobility.    Ambulation/Gait               General Gait Details: not ambulatory at baseline  Stairs            Wheelchair Mobility     Tilt Bed    Modified Rankin (Stroke Patients Only)       Balance Overall balance assessment: Needs assistance Sitting-balance support: Feet supported Sitting balance-Leahy Scale: Good     Standing balance support: Bilateral upper extremity supported, During functional activity Standing balance-Leahy Scale: Poor                               Pertinent Vitals/Pain Pain Assessment Pain Assessment: No/denies pain    Home Living Family/patient expects to be discharged to:: Private residence Living Arrangements: Children Available Help at Discharge: Family;Available 24 hours/day Type of Home: House Home Access: Stairs to enter       Home Layout: One level Home Equipment: [employer]ant (2 wheels);Shower seat;Wheelchair - manual      Prior Function Prior Level of Function : Needs assist       Physical Assist : Mobility (physical);ADLs (physical) Mobility (physical): Transfers ADLs (physical): IADLs;Toileting;Dressing;Bathing Mobility Comments: Transfers to [facility] with assistance from family ADLs Comments: Uses briefs for toileting. States her dtr, grandson, or son generally assist her with most ADL/IADL management.     Extremity/Trunk Assessment   Upper Extremity Assessment Upper  Extremity Assessment: Generalized weakness    Lower Extremity Assessment Lower Extremity Assessment: Generalized weakness    Cervical / Trunk Assessment Cervical / Trunk Assessment: Kyphotic  Communication   Communication Communication: Impaired Factors Affecting Communication: Hearing impaired    Cognition Arousal: Alert Behavior During Therapy: WFL for tasks assessed/performed   PT - Cognitive impairments: No apparent impairments                          Following commands: Impaired Following commands impaired: Follows one step commands with increased time     Cueing Cueing Techniques: Verbal cues, Gestural cues, Visual cues     General Comments      Exercises     Assessment/Plan    PT Assessment Patient does not need any further PT services  PT Problem List         PT Treatment Interventions      PT Goals (Current goals can be found in the Care Plan section)  Acute Rehab PT Goals Patient Stated Goal: return home PT Goal Formulation: With patient Time For Goal Achievement: 10/14/24 Potential to Achieve Goals: Good    Frequency       Co-evaluation               AM-PAC PT 6 Clicks Mobility  Outcome Measure Help needed turning from your back to your side while in a flat bed without using bedrails?: A Little Help needed moving from lying on your back to sitting on the side of a flat bed without using bedrails?: A Little Help needed moving to and from a bed to a chair (including a wheelchair)?: A Little Help needed standing up from a chair using your arms (e.g., wheelchair or bedside chair)?: A Little Help needed to walk in hospital room?: Total Help needed climbing 3-5 steps with a railing? : Total 6 Click Score: 14    End of Session   Activity Tolerance: Patient tolerated treatment well Patient left: in bed;with call bell/phone within reach;with bed alarm set Nurse Communication: Mobility status      Time: 1340-1353 PT Time Calculation (min) (ACUTE ONLY): 13 min   Charges:   PT Evaluation $PT Eval Low Complexity: 1 Low   PT General Charges $$ ACUTE PT VISIT: 1 Visit         Felicha Frayne, PT, GCS 10/10/24,2:02 PM

## 2024-10-10 NOTE — Evaluation (Signed)
 Occupational Therapy Evaluation Patient Details Name: Kristy Archer MRN: [id number] DOB: 07-Jul-1941 Today's Date: 10/10/2024   History of Present Illness   Kristy Archer is a 84 y.o. year old female with medical history of hypertension, hyperlipidemia, type 2 diabetes, history of PE, paroxysmal A-fib, GERD, hypothyroidism presenting to the ED with worsening shortness of breath.     Clinical Impressions Kristy Archer was seen for OT evaluation this date. Prior to hospital admission, pt required some assistance for functional transfers to/from her WC, and LB ADL management from STS. Pt lives with her daughter, but has multiple family members available to provide round the clock assistance. Pt presents with deficits in strength, balance, pain, and dynamic standing balance limiting their ability to perform ADL management at baseline level. Pt currently requires MIN A for stand pivot transfers, LB dressing and bathing.  Pt would benefit from skilled OT services while acutely hospitalized to address noted impairments and functional limitations (see below for any additional details) in order to maximize safety and independence while minimizing future risk of falls, injury, and readmission. Do not anticipate the need for follow up OT services upon acute hospital DC.      If plan is discharge home, recommend the following:   A little help with walking and/or transfers;A little help with bathing/dressing/bathroom;Help with stairs or ramp for entrance;Assist for transportation;Assistance with cooking/housework     Functional Status Assessment   Patient has had a recent decline in their functional status and demonstrates the ability to make significant improvements in function in a reasonable and predictable amount of time.     Equipment Recommendations   BSC/3in1     Recommendations for Other Services         Precautions/Restrictions   Precautions Precautions: Fall Recall of  Precautions/Restrictions: Intact Restrictions Weight Bearing Restrictions Per Provider Order: No     Mobility Bed Mobility Overal bed mobility: Needs Assistance Bed Mobility: Supine to Sit, Sit to Supine     Supine to sit: Min assist, Used rails, HOB elevated Sit to supine: Min assist, HOB elevated, Used rails        Transfers Overall transfer level: Needs assistance Equipment used: 1 person hand held assist Transfers: Sit to/from Stand, Bed to chair/wheelchair/BSC Sit to Stand: Min assist Stand pivot transfers: Contact guard assist                Balance Overall balance assessment: Needs assistance Sitting-balance support: Feet supported, No upper extremity supported Sitting balance-Leahy Scale: Good     Standing balance support: Reliant on assistive device for balance, During functional activity, Bilateral upper extremity supported Standing balance-Leahy Scale: Poor                             ADL either performed or assessed with clinical judgement   ADL Overall ADL's : Needs assistance/impaired     Grooming: Sitting;Supervision/safety;Set up;Wash/dry face                   Toilet Transfer: Minimal assistance;Stand-pivot;BSC/3in1 Toilet Transfer Details (indicate cue type and reason): Simulated to/from recliner. Toileting- Clothing Manipulation and Hygiene: Minimal assistance;Sit to/from stand       Functional mobility during ADLs: Minimal assistance;Rolling walker (2 wheels);Contact guard assist       Vision Baseline Vision/History: 1 Wears glasses Ability to See in Adequate Light: 1 Impaired Patient Visual Report: No change from baseline       Perception  Praxis         Pertinent Vitals/Pain Pain Assessment Pain Assessment: Faces Faces Pain Scale: Hurts little more Pain Location: B knees with mobility Pain Descriptors / Indicators: Grimacing, Sore Pain Intervention(s): Limited activity within patient's  tolerance, Monitored during session, Repositioned     Extremity/Trunk Assessment Upper Extremity Assessment Upper Extremity Assessment: Generalized weakness   Lower Extremity Assessment Lower Extremity Assessment: Generalized weakness   Cervical / Trunk Assessment Cervical / Trunk Assessment: Kyphotic   Communication Communication Communication: Impaired Factors Affecting Communication: Hearing impaired   Cognition Arousal: Alert Behavior During Therapy: WFL for tasks assessed/performed Cognition: Difficult to assess Difficult to assess due to: Hard of hearing/deaf           OT - Cognition Comments: Oriented to self, place, and situation. Did not assess for date. Some difficulty following VCs during session. Suspect this is 2/2 hearing loss vs. cognition.                 Following commands: Impaired Following commands impaired: Follows one step commands with increased time     Cueing  General Comments   Cueing Techniques: Verbal cues;Gestural cues;Visual cues      Exercises Other Exercises Other Exercises: Pt educated on role of OT in acute setting, safe transfer technique, falls prevention strategies, and DC recs.   Shoulder Instructions      Home Living Family/patient expects to be discharged to:: Private residence Living Arrangements: Children Available Help at Discharge: Family;Available 24 hours/day (Family is with her 24/7) Type of Home: House Home Access: Stairs to enter     Home Layout: One level     Bathroom Shower/Tub: Producer, Television/film/video: Standard     Home Equipment: Agricultural Consultant (2 wheels);Shower seat          Prior Functioning/Environment Prior Level of Function : Needs assist             Mobility Comments: Transfers to [facility] with assistance from family ADLs Comments: Uses briefs for toileting. States her dtr, grandson, or son generally assist her with most ADL/IADL management.    OT Problem List: Decreased  strength;Decreased coordination;Decreased range of motion;Decreased safety awareness;Decreased activity tolerance;Impaired balance (sitting and/or standing)   OT Treatment/Interventions: Self-care/ADL training;Therapeutic exercise;Therapeutic activities;DME and/or AE instruction;Patient/family education;Balance training      OT Goals(Current goals can be found in the care plan section)   Acute Rehab OT Goals Patient Stated Goal: To go home OT Goal Formulation: With patient Time For Goal Achievement: 10/24/24 Potential to Achieve Goals: Good ADL Goals Pt Will Perform Grooming: sitting;with modified independence Pt Will Perform Lower Body Dressing: sit to/from stand;with caregiver independent in assisting;with min assist;with adaptive equipment Pt Will Transfer to Toilet: bedside commode;with min assist;stand pivot transfer Pt Will Perform Toileting - Clothing Manipulation and hygiene: sitting/lateral leans;with supervision;with set-up   OT Frequency:  Min 2X/week    Co-evaluation              AM-PAC OT 6 Clicks Daily Activity     Outcome Measure Help from another person eating meals?: A Little Help from another person taking care of personal grooming?: A Little Help from another person toileting, which includes using toliet, bedpan, or urinal?: A Little Help from another person bathing (including washing, rinsing, drying)?: A Little Help from another person to put on and taking off regular upper body clothing?: A Little Help from another person to put on and taking off regular lower body clothing?: A Little  6 Click Score: 18   End of Session Equipment Utilized During Treatment: Gait belt  Activity Tolerance: Patient tolerated treatment well Patient left: in bed;with call bell/phone within reach;with bed alarm set  OT Visit Diagnosis: Other abnormalities of gait and mobility (R26.89);Muscle weakness (generalized) (M62.81)                Time: 9161-9095 OT Time  Calculation (min): 26 min Charges:  OT General Charges $OT Visit: 1 Visit OT Evaluation $OT Eval Low Complexity: 1 Low OT Treatments $Self Care/Home Management : 8-22 mins Jhonny Pelton, M.S., OTR/L 10/10/24, 12:18 PM

## 2024-10-11 NOTE — Discharge Summary (Signed)
 " Physician Discharge Summary   Patient: Kristy Archer MRN: [id number] DOB: [birth year]  Admit date:     10/09/2024  Discharge date: 10/10/2024  Discharge Physician: Kristy Archer   PCP: Kristy Lamar BRAVO, NP   Recommendations at discharge:    F/up with outpt providers as requested  Discharge Diagnoses: Principal Problem:   Acute hypoxic respiratory failure (HCC) Active Problems:   Essential hypertension   Hypothyroidism   Mixed hyperlipidemia   OSA (obstructive sleep apnea)   Type 2 diabetes mellitus without complications (HCC)   Community acquired pneumonia  Hospital Course: Assessment and Plan:  84 y.o. year old female with medical history of hypertension, hyperlipidemia, type 2 diabetes, history of PE, paroxysmal A-fib, GERD, hypothyroidism presenting to the ED with worsening shortness of breath.  She states she felt like she choked on some food and has been having coughing and shortness of breath since then.  States this happens 2-3 times a year. She denies any other URI symptoms   Acute hypoxic respiratory failure secondary to pneumonia.  Image findings consistent with pneumonia vs pneumonitis 2/2 to aspiration. Resolved. Treated with IV Ceftriaxone  and Azithromycin  with good response. She's on room air now. She was evaluated by ST and recommended GI eval due to concern for esophageal dysphagia/dysmotility.  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  The patient was dilated in the past multiple times as reported by the daughter and her last dilation was done back in January 2025.  At that time the esophagus was dilated to 12 mm and the patient and the daughter report that there is no improvement in her symptoms.  There is a report of the patient also having esophageal dysmotility as a baseline. - Diet modification advised to avoid risk for aspirations - outpt GI  f/up - she is feeling back to baseline and being DCed in stable condition  Dysphagia: see ST recommendations below which I agree Thin;Dysphagia 2 (chopped) (gravies) = a more MINCED foods diet (well-moistened foods) w/ thin liquids. Pt can f/u w/ GI for ongoing management and upgrade to foods in her diet that she is comfortable with. Recommend general aspiration precautions. Rest Breaks during meals/oral intake to allow for Esophageal clearing. Small bites/sips Slowly and alternate b/t. Chew foods well b/f swallowing. Sit upright during meals. REFLUX precautions strongly recommended to lessen chance for Regurgitation -- HOB elevated post meals and at night when sleeping(30 degrees rec).    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         Consultants: GI  Disposition: Home Diet recommendation:  Carb modified diet DISCHARGE MEDICATION: Allergies as of 10/10/2024       Reactions   Codeine Anaphylaxis   Other reaction(s): Unknown Can take morphine  per patient; also tolerates hydromorphone    Linezolid    Other reaction(s): Angioedema (ALLERGY/intolerance)   Sulfa Antibiotics Itching, Rash        Medication List     STOP taking these medications    aminophylline  injection   famotidine  40 MG tablet Commonly known as: PEPCID    meclizine 25 MG tablet Commonly known as: ANTIVERT   metoCLOPramide 10 MG tablet Commonly known as: REGLAN   nitrofurantoin  (macrocrystal-monohydrate) 100 MG capsule Commonly known as: MACROBID   oxyCODONE -acetaminophen  10-325 MG tablet Commonly known as: PERCOCET   sucralfate  1 g tablet Commonly known as: CARAFATE    Voquezna  20 MG Tabs Generic drug: Vonoprazan Fumarate        TAKE these medications    amLODipine  2.5 MG tablet Commonly known as:  NORVASC  Take 2.5 mg by mouth daily.   apixaban  2.5 MG Tabs tablet Commonly known as: Eliquis  Take 1 tablet (2.5 mg total) by mouth 2 (two) times daily.   cyanocobalamin  1000 MCG tablet Take 1,000 mcg by mouth daily.   doxycycline  100 MG tablet Commonly known as: VIBRA -TABS Take 1 tablet (100 mg total) by mouth 2 (two) times daily for 5 days.   estradiol  0.1 MG/GM vaginal cream Commonly known as: ESTRACE  Estrogen Cream Instruction Discard applicator Apply pea sized amount to tip of finger to urethra before bed. Wash hands well after application. Use Monday, Wednesday and Friday   levothyroxine  100 MCG tablet Commonly known as: SYNTHROID  Take 100 mcg by mouth every morning.   omeprazole 40 MG capsule Commonly known as: PRILOSEC Take 40 mg by mouth 2 (two) times daily.   ondansetron  8 MG tablet Commonly known as: ZOFRAN  Take 8 mg by mouth every 8 (eight) hours as needed.   sertraline  100 MG tablet Commonly known as: ZOLOFT  Take 100 mg by mouth daily.   simvastatin  20 MG tablet Commonly known as: ZOCOR  Take 20 mg by mouth every evening.   sodium chloride  1 g tablet Take 1 g by mouth 2 (two) times daily.        Follow-up Information     Kristy Lamar BRAVO, NP. Schedule an appointment as soon as possible for a visit in 1 week(s).   Why: [facility] Discharge F/UP Contact information: [address] [place] [address] [phone]         Benedetto Catarina Fend, [place]. Schedule an appointment as soon as possible for a visit on 12/18/2024.   Specialty: Gastroenterology Why: [facility] Discharge F/UPat 1130 Contact information: [address] [place] [address] [phone]         Kristy Lamar BRAVO, NP Follow up on 10/17/2024.   Why: at 14 Contact information: [address] [place] [address] [phone]                Discharge Exam: [name]   10/09/24 1743 10/10/24 0500  Weight: 41.6 kg 42.1 kg    Gen: NAD, pleasant female HENT: NCAT, [place] in place CV: normal heart sounds Lung: CTAB Abd: No TTP, normal bowel sounds MSK: No asymmetry, decreased bulk and tone Neuro: alert and oriented x4  Condition at discharge: fair  The results of significant diagnostics from this hospitalization (including imaging, microbiology, ancillary and laboratory) are listed below for reference.   Imaging Studies: DG Chest 2 View Result Date: 10/09/2024 EXAM: 2 VIEW(S) XRAY OF THE CHEST 10/09/2024 06:09:00 PM COMPARISON: CT 11/04/2023. CLINICAL HISTORY: Shortness of breath. FINDINGS: LUNGS AND PLEURA: Low lung volumes. Bilateral lower lobe airspace opacities, left greater than right concerning for pneumonia. No pleural effusion. No pneumothorax. HEART AND MEDIASTINUM: Hertz borderline in size. BONES AND SOFT TISSUES: No acute osseous abnormality. IMPRESSION: 1. Bilateral lower lobe airspace opacities, left greater than right, concerning for pneumonia. Electronically signed by: Franky Crease MD 10/09/2024 06:25 PM EST RP Workstation: [id number]    Microbiology: Results for orders placed or performed during the hospital encounter of 10/09/24  Culture, blood (routine x 2)     Status: None (Preliminary result)  Collection Time: 10/09/24  5:55 PM   Specimen: BLOOD  Result Value Ref Range Status   Specimen Description BLOOD RIGHT ANTECUBITAL  Final   Special Requests   Final    BOTTLES DRAWN AEROBIC AND ANAEROBIC Blood Culture adequate volume   Culture   Final    NO GROWTH 2 DAYS Performed at [facility], [address] [place], [place] [address]    Report Status PENDING  Incomplete  Culture, blood (routine x 2)     Status: None (Preliminary result)   Collection Time: 10/09/24  6:00 PM   Specimen: BLOOD  Result Value Ref Range Status   Specimen Description BLOOD LEFT ANTECUBITAL  Final   Special Requests   Final    AEROBIC BOTTLE ONLY Blood Culture results may not be optimal due to an  inadequate volume of blood received in culture bottles   Culture   Final    NO GROWTH 2 DAYS Performed at [facility], [address] [place], [place] [address]    Report Status PENDING  Incomplete  Resp panel by RT-PCR (RSV, Flu A&B, Covid) Anterior Nasal Swab     Status: None   Collection Time: 10/09/24  6:20 PM   Specimen: Anterior Nasal Swab  Result Value Ref Range Status   SARS Coronavirus 2 by RT PCR NEGATIVE NEGATIVE Final    Comment: (NOTE) SARS-CoV-2 target nucleic acids are NOT DETECTED.  The SARS-CoV-2 RNA is generally detectable in upper respiratory specimens during the acute phase of infection. The lowest concentration of SARS-CoV-2 viral copies this assay can detect is 138 copies/mL. A negative result does not preclude SARS-Cov-2 infection and should not be used as the sole basis for treatment or other patient management decisions. A negative result may occur with  improper specimen collection/handling, submission of specimen other than nasopharyngeal swab, presence of viral mutation(s) within the areas targeted by this assay, and inadequate number of viral copies(<138 copies/mL). A negative result must be combined with clinical observations, patient history, and epidemiological information. The expected result is Negative.  Fact Sheet for Patients:  bloggercourse.com  Fact Sheet for Healthcare Providers:  seriousbroker.it  This test is no t yet approved or cleared by the [place]  FDA and  has been authorized for detection and/or diagnosis of SARS-CoV-2 by FDA under an Emergency Use Authorization (EUA). This EUA will remain  in effect (meaning this test can be used) for the duration of the COVID-19 declaration under Section 564(b)(1) of the Act, 21 U.S.C.section 360bbb-3(b)(1), unless the authorization is terminated  or revoked sooner.       Influenza A by PCR NEGATIVE NEGATIVE Final    Influenza B by PCR NEGATIVE NEGATIVE Final    Comment: (NOTE) The Xpert Xpress SARS-CoV-2/FLU/RSV plus assay is intended as an aid in the diagnosis of influenza from Nasopharyngeal swab specimens and should not be used as a sole basis for treatment. Nasal washings and aspirates are unacceptable for Xpert Xpress SARS-CoV-2/FLU/RSV testing.  Fact Sheet for Patients: bloggercourse.com  Fact Sheet for Healthcare Providers: seriousbroker.it  This test is not yet approved or cleared by the [place]  FDA and has been authorized for detection and/or diagnosis of SARS-CoV-2 by FDA under an Emergency Use Authorization (EUA). This EUA will remain in effect (meaning this test can be used) for the duration of the COVID-19 declaration under Section 564(b)(1) of the Act, 21 U.S.C. section 360bbb-3(b)(1), unless the authorization is terminated or revoked.     Resp Syncytial Virus by PCR  NEGATIVE NEGATIVE Final    Comment: (NOTE) Fact Sheet for Patients: bloggercourse.com  Fact Sheet for Healthcare Providers: seriousbroker.it  This test is not yet approved or cleared by the [place]  FDA and has been authorized for detection and/or diagnosis of SARS-CoV-2 by FDA under an Emergency Use Authorization (EUA). This EUA will remain in effect (meaning this test can be used) for the duration of the COVID-19 declaration under Section 564(b)(1) of the Act, 21 U.S.C. section 360bbb-3(b)(1), unless the authorization is terminated or revoked.  Performed at [facility], [address] [place], [place] [address]     Labs: CBC: Recent Labs  Lab 10/09/24 1755 10/10/24 0334  WBC 7.2 5.9  NEUTROABS 6.1  --   HGB 10.9* 9.7*  HCT 34.8* 30.9*  MCV 84.7 85.6  PLT 217 203   Basic Metabolic Panel: Recent Labs  Lab 10/09/24 1755 10/09/24 2050 10/10/24 0334  NA 137  --  135  K 4.4   --  4.3  CL 98  --  99  CO2 31  --  30  GLUCOSE 173*  --  123*  BUN 14  --  11  CREATININE 0.58  --  0.47  CALCIUM 8.6*  --  8.2*  MG  --  2.0  --    Liver Function Tests: Recent Labs  Lab 10/09/24 1755  AST 17  ALT 8  ALKPHOS 88  BILITOT 0.2  PROT 6.7  ALBUMIN 3.6   CBG: Recent Labs  Lab 10/09/24 2147 10/10/24 0736 10/10/24 1139 10/10/24 1752  GLUCAP 151* 82 95 96    Discharge time spent: greater than 30 minutes.  Signed: Cresencio Fairly, MD [facility] 10/11/2024 "

## 2024-10-14 LAB — CULTURE, BLOOD (ROUTINE X 2)
Culture: NO GROWTH
Culture: NO GROWTH
Special Requests: ADEQUATE

## 2025-05-15 ENCOUNTER — Ambulatory Visit (INDEPENDENT_AMBULATORY_CARE_PROVIDER_SITE_OTHER): Admitting: Vascular Surgery
